# Patient Record
Sex: Male | Born: 1950
Health system: Southern US, Community
[De-identification: ages and names within clinical notes are randomized; demographics above are authoritative.]

## PROBLEM LIST (undated history)

## (undated) DIAGNOSIS — M1990 Unspecified osteoarthritis, unspecified site: Secondary | ICD-10-CM

## (undated) DIAGNOSIS — I10 Essential (primary) hypertension: Secondary | ICD-10-CM

## (undated) DIAGNOSIS — G4700 Insomnia, unspecified: Secondary | ICD-10-CM

## (undated) DIAGNOSIS — E785 Hyperlipidemia, unspecified: Secondary | ICD-10-CM

## (undated) HISTORY — DX: Essential (primary) hypertension: I10

## (undated) HISTORY — DX: Insomnia, unspecified: G47.00

## (undated) HISTORY — DX: Hyperlipidemia, unspecified: E78.5

## (undated) HISTORY — DX: Unspecified osteoarthritis, unspecified site: M19.90

---

## 2000-11-18 HISTORY — PX: TUMOR REMOVAL: SHX12

## 2005-09-03 ENCOUNTER — Ambulatory Visit: Payer: Self-pay

## 2007-11-19 HISTORY — PX: APPENDECTOMY: SHX54

## 2008-10-10 ENCOUNTER — Observation Stay: Payer: Self-pay | Admitting: Vascular Surgery

## 2010-05-24 ENCOUNTER — Ambulatory Visit: Payer: Self-pay | Admitting: Internal Medicine

## 2011-12-13 IMAGING — CR DG KNEE COMPLETE 4+V*R*
1 series · 4 of 4 positions shown · non-contrast
Comparison: none

REASON FOR EXAM: RT KNEE PAIN
COMMENTS:

PROCEDURE:     DXR - DXR KNEE RT COMP WITH OBLIQUES  - May 24, 2010  [DATE]
RESULT:     Images of the right knee demonstrate no significant degenerative
change, destruction or fracture. There is some hypertrophic spurring from
the superior anterior margin of the patella.

[Series 1: view not recorded · 0.17mm/px · 4 of 4 slices shown]
[im 1/4]
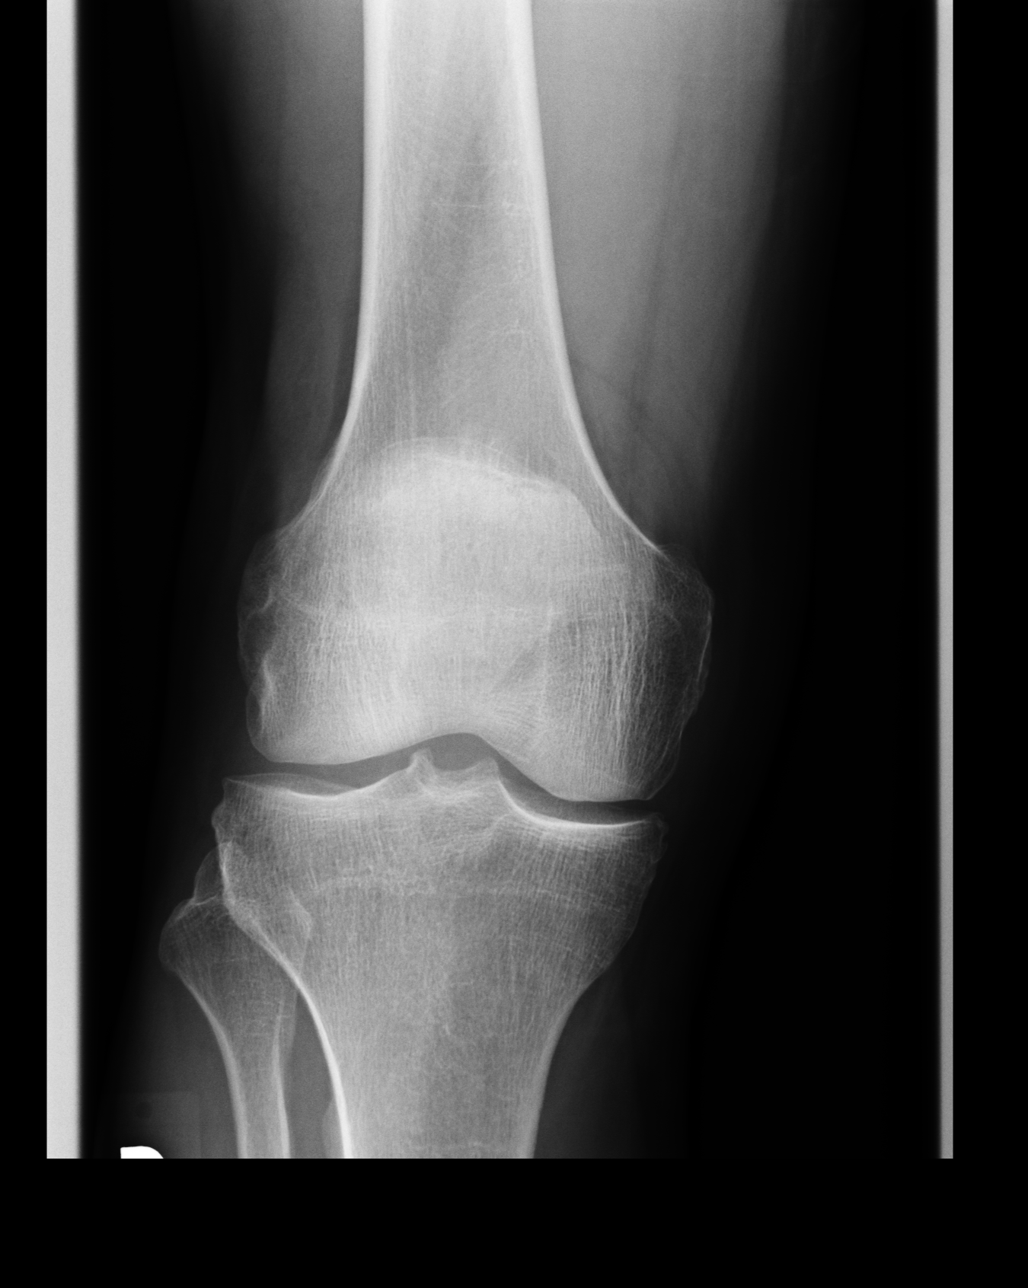
[im 2/4]
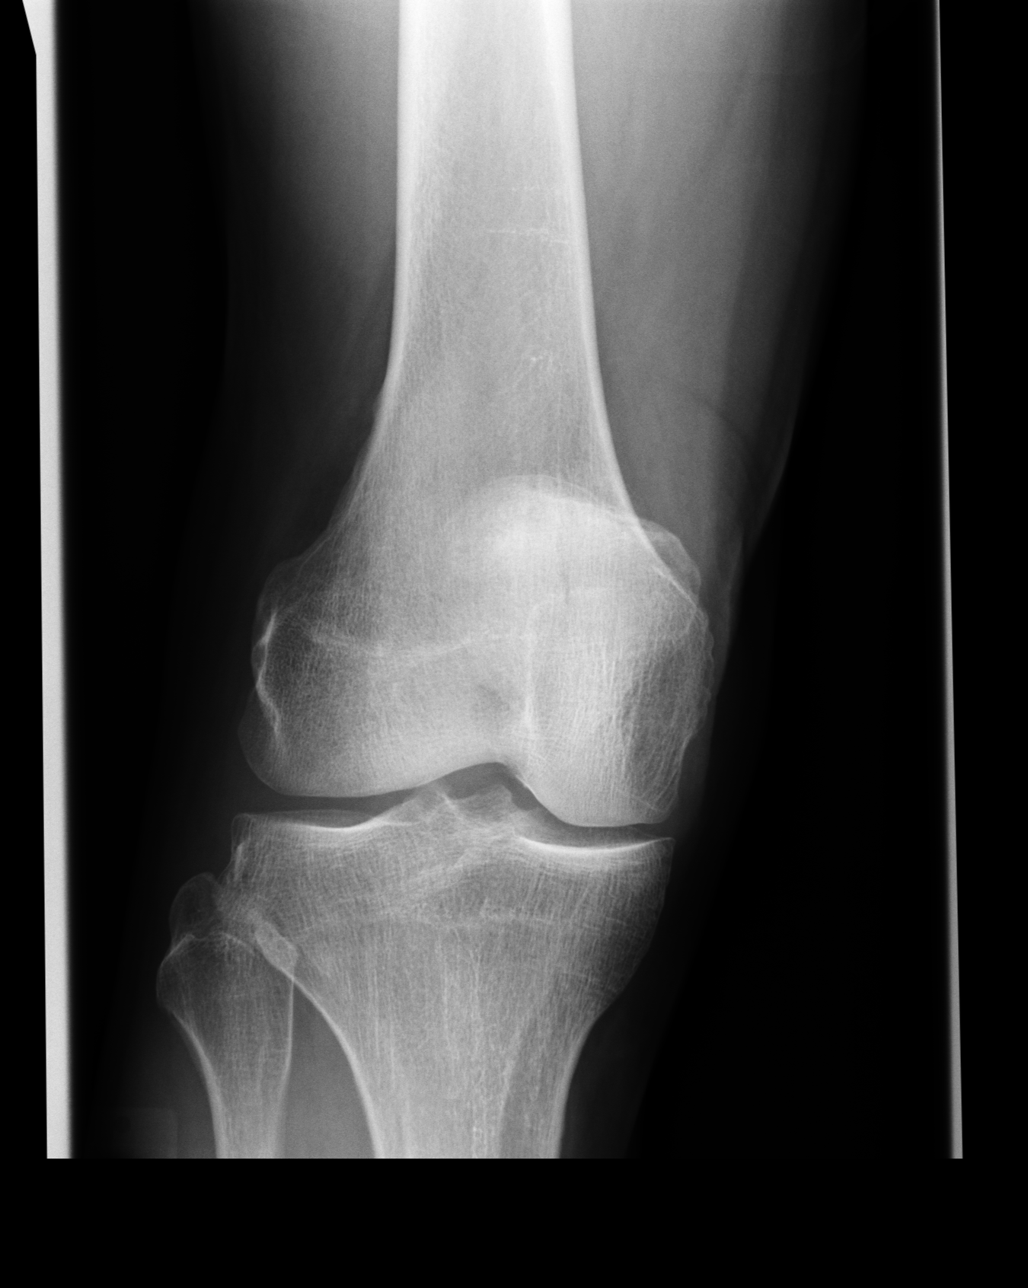
[im 3/4]
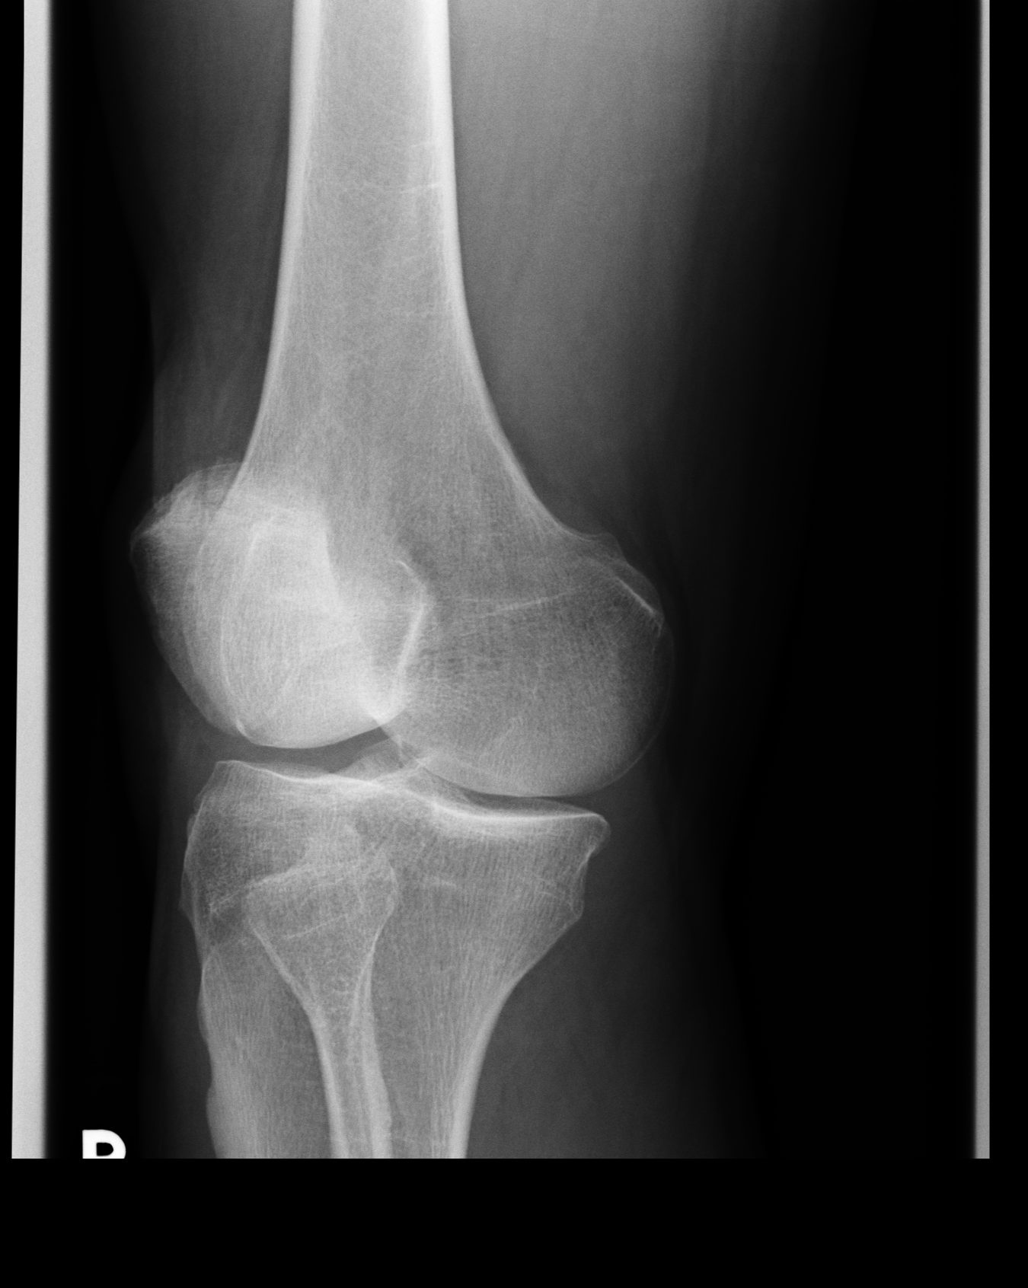
[im 4/4]
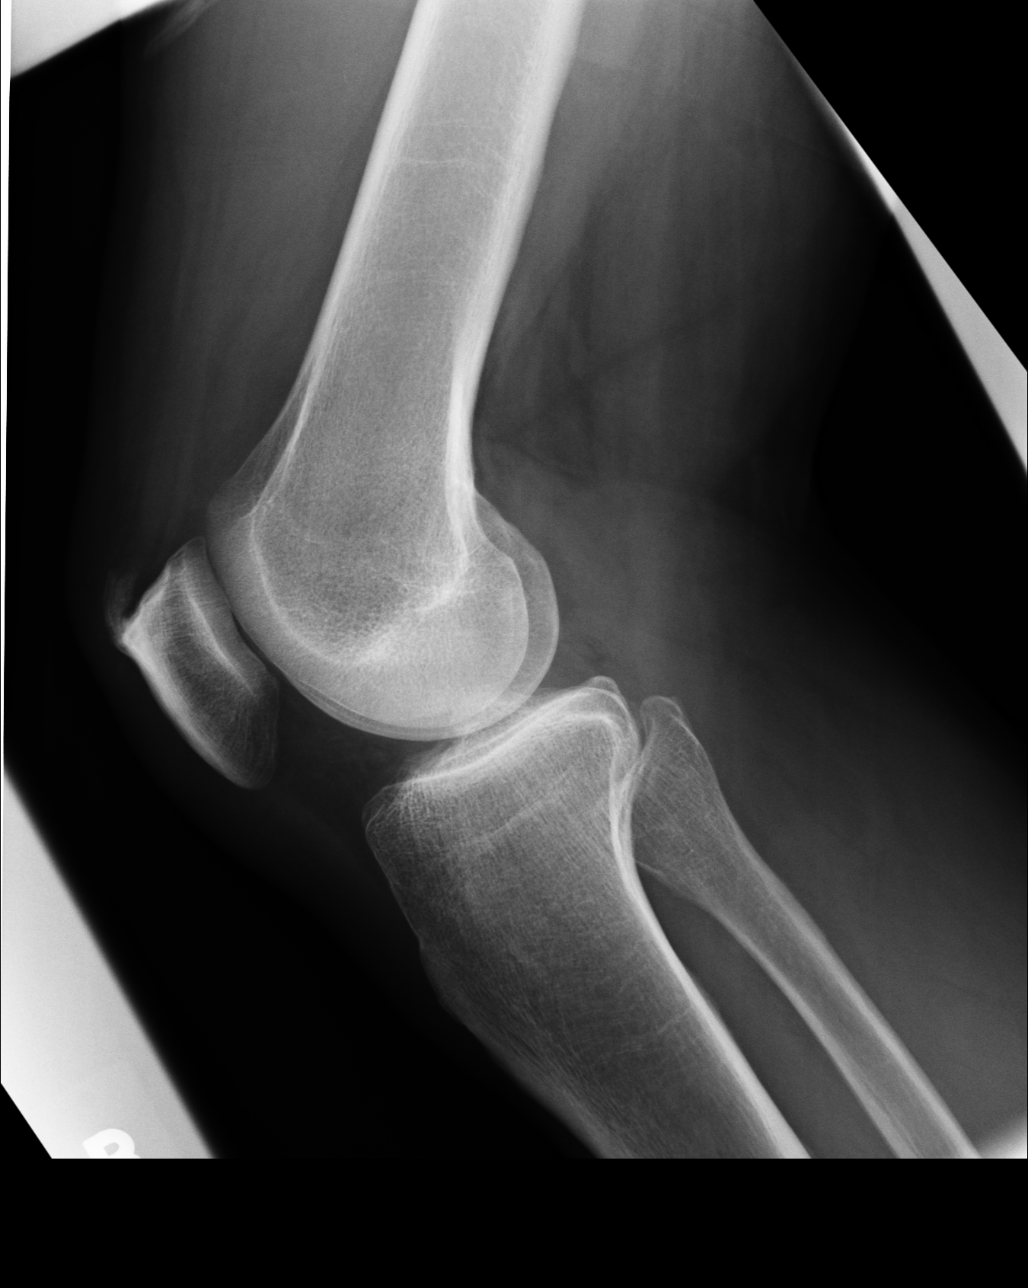

[4 of 4 positions shown; findings below may reference images not displayed]

IMPRESSION: No acute bony abnormality.

## 2014-01-24 ENCOUNTER — Emergency Department: Payer: Self-pay | Admitting: Emergency Medicine

## 2014-01-26 LAB — BETA STREP CULTURE(ARMC)

## 2015-01-16 ENCOUNTER — Encounter: Payer: Self-pay | Admitting: Podiatry

## 2015-01-16 ENCOUNTER — Ambulatory Visit (INDEPENDENT_AMBULATORY_CARE_PROVIDER_SITE_OTHER): Payer: Managed Care, Other (non HMO) | Admitting: Podiatry

## 2015-01-16 VITALS — BP 125/82 | HR 71 | Resp 16 | Ht 72.0 in | Wt 185.0 lb

## 2015-01-16 DIAGNOSIS — L600 Ingrowing nail: Secondary | ICD-10-CM

## 2015-01-16 MED ORDER — NEOMYCIN-POLYMYXIN-HC 3.5-10000-1 OT SOLN: OTIC | Status: DC

## 2015-01-16 NOTE — Progress Notes (Signed)
   Subjective:    Patient ID: Terry Harvey., male    DOB: 1951/01/13, 64 y.o.   MRN: [id number]  HPI Comments: "I've got something going on with the toe"  Patient c/o tender 1st toe right, lateral border, for about 8 months. Worsened in the last month. The area is red and swollen. He has been trimming it out. Soaking and neosporin daily for awhile. Better today.  Toe Pain       Review of Systems  Musculoskeletal: Positive for arthralgias and gait problem.  All other systems reviewed and are negative.      Objective:   Physical Exam: I have reviewed his past medical history medications allergies surgery social history and review of systems area pulses are strongly palpable bilateral and neurologic sensorium is intact. Neurologic sensorium is intact deep tendon reflexes are intact muscle strength is 5 over 5 dorsiflexion plantar flexors and inverters everters all into the musculature is intact. Orthopedic evaluation demonstrates all joints distal to the ankle have a full range of motion without crepitation. Cutaneous evaluation demonstrates supple well-hydrated cutis sharply rated nail margin along the fibular border of the hallux right with gross granulation tissue malodor and purulence.        Assessment & Plan:  Assessment: Ingrown nail paronychia abscess hallux right fibular border.  Plan: Discussed etiology pathology conservative versus surgical therapies at this point I encouraged excision of the fibular border of the nail with matrixectomy. He tolerated this procedure well after local anesthetic was administered. Reapplication's of phenol were applied to the nail bed and matrix which was then neutralized last from alcohol. I will follow-up with him in 1 week. He is given both oral and written home-going instructions for care of his toe.

## 2015-01-16 NOTE — Patient Instructions (Signed)

## 2015-01-23 ENCOUNTER — Encounter: Payer: Self-pay | Admitting: Podiatry

## 2015-01-23 ENCOUNTER — Ambulatory Visit (INDEPENDENT_AMBULATORY_CARE_PROVIDER_SITE_OTHER): Payer: Managed Care, Other (non HMO) | Admitting: Podiatry

## 2015-01-23 DIAGNOSIS — L600 Ingrowing nail: Secondary | ICD-10-CM

## 2015-01-23 NOTE — Progress Notes (Signed)
He presents today 1 week status post matrixectomy hallux right fibular border. He states that he is doing 100% better he says it hardly hurts at all. He states that he should've had this done longtime ago. He has been soaking in Betadine in warm water and applying Cortisporin otic as directed..  Objective: Pulses are palpable right. Hallux right demonstrates no erythema edema cellulitis drainage or odor. This appears to be healing very well.  Assessment: Well-healed surgical to hallux right.  Plan: Continue to soak in Epsom salts and warm water and apply Cortisporin otic twice daily. He will cover during the day and leave open at night. Follow up with me as needed.

## 2017-02-20 DIAGNOSIS — M170 Bilateral primary osteoarthritis of knee: Secondary | ICD-10-CM | POA: Diagnosis not present

## 2017-04-24 DIAGNOSIS — M179 Osteoarthritis of knee, unspecified: Secondary | ICD-10-CM | POA: Diagnosis not present

## 2017-04-24 DIAGNOSIS — I10 Essential (primary) hypertension: Secondary | ICD-10-CM | POA: Diagnosis not present

## 2017-04-24 DIAGNOSIS — F5101 Primary insomnia: Secondary | ICD-10-CM | POA: Diagnosis not present

## 2017-07-14 DIAGNOSIS — M170 Bilateral primary osteoarthritis of knee: Secondary | ICD-10-CM | POA: Diagnosis not present

## 2017-10-29 ENCOUNTER — Ambulatory Visit (INDEPENDENT_AMBULATORY_CARE_PROVIDER_SITE_OTHER): Payer: Medicare Other | Admitting: Nurse Practitioner

## 2017-10-29 ENCOUNTER — Other Ambulatory Visit: Payer: Self-pay | Admitting: Nurse Practitioner

## 2017-10-29 ENCOUNTER — Encounter: Payer: Self-pay | Admitting: Nurse Practitioner

## 2017-10-29 VITALS — BP 130/80 | HR 88 | Resp 16 | Ht 72.0 in | Wt 194.0 lb

## 2017-10-29 DIAGNOSIS — M170 Bilateral primary osteoarthritis of knee: Secondary | ICD-10-CM | POA: Diagnosis not present

## 2017-10-29 DIAGNOSIS — R10823 Right lower quadrant rebound abdominal tenderness: Secondary | ICD-10-CM | POA: Diagnosis not present

## 2017-10-29 DIAGNOSIS — F5101 Primary insomnia: Secondary | ICD-10-CM

## 2017-10-29 DIAGNOSIS — Z0001 Encounter for general adult medical examination with abnormal findings: Secondary | ICD-10-CM | POA: Diagnosis not present

## 2017-10-29 DIAGNOSIS — Z1211 Encounter for screening for malignant neoplasm of colon: Secondary | ICD-10-CM

## 2017-10-29 DIAGNOSIS — Z125 Encounter for screening for malignant neoplasm of prostate: Secondary | ICD-10-CM

## 2017-10-29 DIAGNOSIS — I10 Essential (primary) hypertension: Secondary | ICD-10-CM

## 2017-10-29 MED ORDER — ZOLPIDEM TARTRATE 10 MG PO TABS: 10.0000 mg | ORAL_TABLET | Freq: Every day | ORAL | 5 refills | Status: DC

## 2017-10-29 MED ORDER — TRAMADOL HCL 50 MG PO TABS: 50.0000 mg | ORAL_TABLET | Freq: Three times a day (TID) | ORAL | 1 refills | Status: DC

## 2017-10-29 NOTE — Progress Notes (Signed)
Subjective:     Patient ID: Terry Harvey., male   DOB: [birth year], 66 y.o.   MRN: [id number]  Right lower quadrant abdominal pain, very intermittent. Happens when he has to have a bowel movement and when he bends over or lifts anything heavy. Has been present for a few years, off and on. Did have appendectomy back in 2009. Curious if this surgical scar could be causing this discomfort.  Continues to have bilateral knee pain. Does have istory of significant osteoarthritis in both knees. Recently had cortisone injections into both knes. Really hasn't helped much. Is on his feet many hours each day. Holding out to have knee replacement surgery for as long as possible. Will follow up with orthopedics when needed.      Review of Systems  Constitutional: Negative.   HENT: Negative.   Respiratory: Negative for chest tightness and shortness of breath.   Cardiovascular: Negative for chest pain and palpitations.  Gastrointestinal: Positive for abdominal pain. Negative for blood in stool, constipation, diarrhea and nausea.       Intermittent right lower quadrant abdominal discomfort   Endocrine: Negative.   Genitourinary: Negative.   Musculoskeletal: Positive for arthralgias and myalgias.  Skin: Negative.   Allergic/Immunologic: Negative.   Neurological: Negative.   Hematological: Negative.   Psychiatric/Behavioral: Negative.        Objective:   Physical Exam  Constitutional: He is oriented to person, place, and time. He appears well-developed and well-nourished.  HENT:  Head: Normocephalic and atraumatic.  Neck: Normal range of motion. Neck supple. No JVD present. No thyromegaly present.  Cardiovascular: Normal rate and regular rhythm.  Pulmonary/Chest: Effort normal and breath sounds normal.  Abdominal: Soft. Bowel sounds are normal. He exhibits no distension, no abdominal bruit and no mass. There is no hepatosplenomegaly. There is no tenderness. There is no guarding and no CVA  tenderness. Hernia confirmed negative in the ventral area, confirmed negative in the right inguinal area and confirmed negative in the left inguinal area.  Musculoskeletal:       Right shoulder: He exhibits decreased range of motion and tenderness.       Right knee: Tenderness found. Medial joint line, lateral joint line, MCL and patellar tendon tenderness noted.       Left knee: He exhibits normal patellar mobility. Tenderness found. Medial joint line, lateral joint line and LCL tenderness noted.  Neurological: He is alert and oriented to person, place, and time. He has normal reflexes.  Skin: Skin is warm and dry.  Psychiatric: He has a normal mood and affect. His behavior is normal.       Assessment:       1. Encounter for routine adult health examination with abnormal findings  - Urinalysis, Routine w reflex microscopic  2. Hypertension, unspecified type   3. Right lower quadrant abdominal tenderness with rebound tenderness  - US Abdomen Complete; Future  4. Encounter for screening for malignant neoplasm of prostate   5. Encounter for screening for malignant neoplasm of colon   6. Primary osteoarthritis of both knees  - traMADol (ULTRAM) 50 MG tablet; Take 1 tablet (50 mg total) by mouth 3 (three) times daily.  Dispense: 90 tablet; Refill: 1  7. Primary insomnia  - zolpidem (AMBIEN) 10 MG tablet; Take 1 tablet (10 mg total) by mouth at bedtime.  Dispense: 30 tablet; Refill: 5 Plan:     1. Wellness visit - routine fasting labs ordered 2. htn - stable. Continue bp medication as prescribed  3. RLQ tenderness - ultrasound ordered for further evaluation. 4. Screening for prostate cancer - ordered PDA with labs 5. Screening for colon cancer - FOBT ordered. Consider colonoscopy 6. Primary osteoarthritis of both knees - May continue tramadol 50mg  TID as before. NSAID to reduce pain/inflammation. Continue follow-up with ortho as indicated  7. Insomnia - may continue zlpidem  10mg  at bedtime as needed. Refill provided today.

## 2017-10-29 NOTE — Patient Instructions (Signed)
1. Abdominal ultrasound to evaluate for possible hernia 2. Routine fasting labs ordered, PSA, and FOBT

## 2017-11-03 ENCOUNTER — Other Ambulatory Visit: Payer: Self-pay

## 2017-11-03 LAB — URINALYSIS, ROUTINE W REFLEX MICROSCOPIC
BILIRUBIN UA: NEGATIVE
GLUCOSE, UA: NEGATIVE
KETONES UA: NEGATIVE
LEUKOCYTES UA: NEGATIVE
NITRITE UA: NEGATIVE
Protein, UA: NEGATIVE
RBC UA: NEGATIVE
SPEC GRAV UA: 1.015 (ref 1.005–1.030)
Urobilinogen, Ur: 0.2 mg/dL (ref 0.2–1.0)
pH, UA: 6 (ref 5.0–7.5)

## 2017-11-24 ENCOUNTER — Other Ambulatory Visit: Payer: Medicare Other

## 2017-12-08 ENCOUNTER — Ambulatory Visit (INDEPENDENT_AMBULATORY_CARE_PROVIDER_SITE_OTHER): Payer: Medicare Other

## 2017-12-08 DIAGNOSIS — R10823 Right lower quadrant rebound abdominal tenderness: Secondary | ICD-10-CM | POA: Diagnosis not present

## 2017-12-08 DIAGNOSIS — Z0001 Encounter for general adult medical examination with abnormal findings: Secondary | ICD-10-CM | POA: Diagnosis not present

## 2017-12-08 DIAGNOSIS — I10 Essential (primary) hypertension: Secondary | ICD-10-CM | POA: Diagnosis not present

## 2017-12-08 DIAGNOSIS — E782 Mixed hyperlipidemia: Secondary | ICD-10-CM | POA: Diagnosis not present

## 2017-12-08 DIAGNOSIS — Z1211 Encounter for screening for malignant neoplasm of colon: Secondary | ICD-10-CM | POA: Diagnosis not present

## 2017-12-30 ENCOUNTER — Encounter: Payer: Self-pay | Admitting: Nurse Practitioner

## 2017-12-31 ENCOUNTER — Telehealth: Payer: Self-pay

## 2017-12-31 NOTE — Telephone Encounter (Signed)
Can you let him know that ultrasound of the abdomen looks good. No acute abnormalities.  Also, I do not see his labs in here at all. They are not in [facility] either.

## 2017-12-31 NOTE — Telephone Encounter (Signed)
Pt advised for u/s and called [facility] for labs

## 2018-01-02 NOTE — Telephone Encounter (Signed)
Also, his blood work looked great. Ldl cholesterol was just a little elevated. That can be corrected with diet and exercise. We can mail him out prudent diet information if he wants. I also have his labs if he'd like a copy. All othe labs were good. Thanks.

## 2018-01-12 ENCOUNTER — Encounter: Payer: Self-pay | Admitting: Nurse Practitioner

## 2018-01-19 ENCOUNTER — Telehealth: Payer: Self-pay

## 2018-01-19 ENCOUNTER — Other Ambulatory Visit: Payer: Self-pay | Admitting: Internal Medicine

## 2018-01-19 NOTE — Telephone Encounter (Signed)
Pt wife advised mailed labs and copy of prudent diet/np

## 2018-03-18 ENCOUNTER — Encounter: Payer: Self-pay | Admitting: Nurse Practitioner

## 2018-03-19 DIAGNOSIS — M170 Bilateral primary osteoarthritis of knee: Secondary | ICD-10-CM | POA: Diagnosis not present

## 2018-03-20 DIAGNOSIS — M170 Bilateral primary osteoarthritis of knee: Secondary | ICD-10-CM | POA: Diagnosis not present

## 2018-04-17 ENCOUNTER — Other Ambulatory Visit: Payer: Self-pay

## 2018-04-17 MED ORDER — METOPROLOL SUCCINATE ER 50 MG PO TB24: 50.0000 mg | ORAL_TABLET | Freq: Every day | ORAL | 1 refills | Status: DC

## 2018-04-22 ENCOUNTER — Other Ambulatory Visit: Payer: Self-pay | Admitting: Nurse Practitioner

## 2018-04-22 DIAGNOSIS — M170 Bilateral primary osteoarthritis of knee: Secondary | ICD-10-CM

## 2018-04-22 MED ORDER — TRAMADOL HCL 50 MG PO TABS: 50.0000 mg | ORAL_TABLET | Freq: Three times a day (TID) | ORAL | 0 refills | Status: DC

## 2018-04-27 ENCOUNTER — Telehealth: Payer: Self-pay

## 2018-04-27 NOTE — Telephone Encounter (Signed)
Error

## 2018-04-30 ENCOUNTER — Ambulatory Visit (INDEPENDENT_AMBULATORY_CARE_PROVIDER_SITE_OTHER): Payer: Medicare Other | Admitting: Nurse Practitioner

## 2018-04-30 ENCOUNTER — Encounter: Payer: Self-pay | Admitting: Nurse Practitioner

## 2018-04-30 VITALS — BP 149/88 | HR 76 | Resp 16 | Ht 72.0 in | Wt 193.6 lb

## 2018-04-30 DIAGNOSIS — R10823 Right lower quadrant rebound abdominal tenderness: Secondary | ICD-10-CM | POA: Diagnosis not present

## 2018-04-30 DIAGNOSIS — F5101 Primary insomnia: Secondary | ICD-10-CM

## 2018-04-30 DIAGNOSIS — M170 Bilateral primary osteoarthritis of knee: Secondary | ICD-10-CM | POA: Diagnosis not present

## 2018-04-30 DIAGNOSIS — I10 Essential (primary) hypertension: Secondary | ICD-10-CM

## 2018-04-30 MED ORDER — ZOLPIDEM TARTRATE 10 MG PO TABS: 10.0000 mg | ORAL_TABLET | Freq: Every day | ORAL | 1 refills | Status: DC

## 2018-04-30 MED ORDER — TRAMADOL HCL 50 MG PO TABS: 50.0000 mg | ORAL_TABLET | Freq: Three times a day (TID) | ORAL | 1 refills | Status: DC

## 2018-04-30 NOTE — Progress Notes (Signed)
[facility] [place], [place] [address]  Internal MEDICINE  Office Visit Note  Patient Name: Terry Harvey  [id number]  [id number]  Date of Service: 05/17/2018   Pt is here for routine follow up.   Chief Complaint  Patient presents with  . Osteoarthritis    6 month follow up  . Hypertension    The patient has moderate arthritis in both knees. Takes naproxen 2 to 3 times daiy to control pain/inflammation. Uses tramadol when needed for breakthrough pain. This combination helps to control his pain very well. Needs to have refills for tramadol today.   Right lower quadrant abdominal pain, very intermittent. Happens when he has to have a bowel movement and when he bends over or lifts anything heavy. Has been present for a few years, off and on. Did have appendectomy back in 2009. Curious if this surgical scar could be causing this discomfort.  Ultrasound was doe of the abdomen since his last visit. Did not show abnormality in right lower quadrant. He did have a great deal of bowel gas which obscured the view of organs. .       Current Medication: Outpatient Encounter Medications as of 04/30/2018  Medication Sig  . bisoprolol-hydrochlorothiazide (ZIAC) 5-6.25 MG per tablet Take 1 tablet by mouth daily.  . metoprolol succinate (TOPROL-XL) 50 MG 24 hr tablet Take 1 tablet (50 mg total) by mouth daily.  . Misc Natural Products (OSTEO BI-FLEX JOINT SHIELD PO) Take by mouth.  . Multiple Vitamin (ONE-A-DAY MENS PO) Take by mouth.  . naproxen sodium (ANAPROX) 220 MG tablet Take 220 mg by mouth 2 (two) times daily with a meal.  . neomycin-polymyxin-hydrocortisone (CORTISPORIN) otic solution Apply one to two drops to toe after soaking twice daily.  . traMADol (ULTRAM) 50 MG tablet Take 1 tablet (50 mg total) by mouth 3 (three) times daily.  . [DISCONTINUED] traMADol (ULTRAM) 50 MG tablet Take 1 tablet (50 mg total) by mouth 3 (three) times daily.  Marland Kitchen zolpidem  (AMBIEN) 10 MG tablet Take 1 tablet (10 mg total) by mouth at bedtime.  . [DISCONTINUED] zolpidem (AMBIEN) 10 MG tablet Take 1 tablet (10 mg total) by mouth at bedtime.   No facility-administered encounter medications on file as of 04/30/2018.     Surgical History: Past Surgical History:  Procedure Laterality Date  . APPENDECTOMY  2009  . TUMOR REMOVAL  2002   from right side of neck    Medical History: Past Medical History:  Diagnosis Date  . Hyperlipidemia   . Hypertension   . Insomnia   . Osteoarthritis     Family History: Family History  Problem Relation Age of Onset  . Diabetes Mellitus II Mother   . Hypertension Father     Social History   Socioeconomic History  . Marital status: Married    Spouse name: Not on file  . Number of children: Not on file  . Years of education: Not on file  . Highest education level: Not on file  Occupational History  . Not on file  Social Needs  . Financial resource strain: Not on file  . Food insecurity:    Worry: Not on file    Inability: Not on file  . Transportation needs:    Medical: Not on file    Non-medical: Not on file  Tobacco Use  . Smoking status: Former Smoker    Start date: 11/19/2011  . Smokeless tobacco: Never Used  Substance and Sexual  Activity  . Alcohol use: No    Alcohol/week: 0.0 oz  . Drug use: No  . Sexual activity: Not on file  Lifestyle  . Physical activity:    Days per week: Not on file    Minutes per session: Not on file  . Stress: Not on file  Relationships  . Social connections:    Talks on phone: Not on file    Gets together: Not on file    Attends religious service: Not on file    Active member of club or organization: Not on file    Attends meetings of clubs or organizations: Not on file    Relationship status: Not on file  . Intimate partner violence:    Fear of current or ex partner: Not on file    Emotionally abused: Not on file    Physically abused: Not on file    Forced  sexual activity: Not on file  Other Topics Concern  . Not on file  Social History Narrative  . Not on file      Review of Systems  Constitutional: Negative for activity change, chills, fatigue and unexpected weight change.  HENT: Negative for congestion, postnasal drip, rhinorrhea, sneezing and sore throat.   Eyes: Negative.  Negative for redness.  Respiratory: Negative for cough, chest tightness, shortness of breath and wheezing.   Cardiovascular: Negative for chest pain and palpitations.  Gastrointestinal: Negative for abdominal pain, blood in stool, constipation, diarrhea, nausea and vomiting.       Intermittent right lower quadrant abdominal discomfort   Endocrine: Negative for cold intolerance, polydipsia, polyphagia and polyuria.  Genitourinary: Negative.  Negative for dysuria and frequency.  Musculoskeletal: Positive for arthralgias and myalgias. Negative for back pain, joint swelling and neck pain.       Bilateral knees.   Skin: Negative for rash.  Allergic/Immunologic: Negative for environmental allergies.  Neurological: Negative for dizziness, tremors, numbness and headaches.  Hematological: Negative for adenopathy. Does not bruise/bleed easily.  Psychiatric/Behavioral: Positive for sleep disturbance. Negative for behavioral problems (Depression), dysphoric mood and suicidal ideas.    Today's Vitals   04/30/18 1420  BP: (!) 149/88  Pulse: 76  Resp: 16  SpO2: 94%  Weight: 193 lb 9.6 oz (87.8 kg)  Height: 6' (1.829 m)    Physical Exam  Constitutional: He is oriented to person, place, and time. He appears well-developed and well-nourished.  HENT:  Head: Normocephalic and atraumatic.  Nose: Nose normal.  Eyes: Pupils are equal, round, and reactive to light. Conjunctivae and EOM are normal.  Neck: Normal range of motion. Neck supple. No JVD present. Carotid bruit is not present. No tracheal deviation present. No thyromegaly present.  Cardiovascular: Normal rate,  regular rhythm and normal heart sounds.  Pulmonary/Chest: Effort normal and breath sounds normal. He has no wheezes.  Abdominal: Soft. Bowel sounds are normal. He exhibits no distension, no abdominal bruit and no mass. There is no hepatosplenomegaly. There is no tenderness. There is no guarding and no CVA tenderness. Hernia confirmed negative in the ventral area, confirmed negative in the right inguinal area and confirmed negative in the left inguinal area.  Musculoskeletal:       Right shoulder: He exhibits normal range of motion and no tenderness.       Right knee: Tenderness found. Medial joint line, lateral joint line, MCL and patellar tendon tenderness noted.       Left knee: He exhibits normal patellar mobility. Tenderness found. Medial joint line, lateral joint line  and LCL tenderness noted.  Lymphadenopathy:    He has no cervical adenopathy.  Neurological: He is alert and oriented to person, place, and time. He has normal reflexes.  Skin: Skin is warm and dry. Capillary refill takes less than 2 seconds.  Psychiatric: He has a normal mood and affect. His behavior is normal. Judgment and thought content normal.  Nursing note and vitals reviewed.  Assessment/Plan:  1. Hypertension, unspecified type Stable. Continue bp medication as prescribed   2. Primary osteoarthritis of both knees May continue to take tramadol 50mg  up to TID when needed for joint pain. New prescription sent to his pharmacy. - traMADol (ULTRAM) 50 MG tablet; Take 1 tablet (50 mg total) by mouth 3 (three) times daily.  Dispense: 270 tablet; Refill: 1  3. Primary insomnia May take ambien at bedtime as needed. New prescription sent to pharmacy.  - zolpidem (AMBIEN) 10 MG tablet; Take 1 tablet (10 mg total) by mouth at bedtime.  Dispense: 90 tablet; Refill: 1  4. Right lower quadrant abdominal tenderness with rebound tenderness Continues to have intermittent RLQ tenderness, especially when bending at the waist. No  acute abnormalities seen on ultrasound. Will get further testing as indictaed.   General Counseling: Kary verbalizes understanding of the findings of todays visit and agrees with plan of treatment. I have discussed any further diagnostic evaluation that may be needed or ordered today. We also reviewed his medications today. he has been encouraged to call the office with any questions or concerns that should arise related to todays visit.    Counseling:  This patient was seen by Leretha Pol, FNP- C in Collaboration with Dr Lavera Guise as a part of collaborative care agreement    Meds ordered this encounter  Medications  . traMADol (ULTRAM) 50 MG tablet    Sig: Take 1 tablet (50 mg total) by mouth 3 (three) times daily.    Dispense:  270 tablet    Refill:  1    90 days prescription.    Order Specific Question:   Supervising Provider    Answer:   Lavera Guise [[id number]]  . zolpidem (AMBIEN) 10 MG tablet    Sig: Take 1 tablet (10 mg total) by mouth at bedtime.    Dispense:  90 tablet    Refill:  1    90 day prescription.    Order Specific Question:   Supervising Provider    Answer:   Lavera Guise [[id number]]    Time spent: 53 Minutes     Dr Lavera Guise Internal medicine

## 2018-06-30 ENCOUNTER — Ambulatory Visit: Payer: Medicare Other | Admitting: Nurse Practitioner

## 2018-06-30 ENCOUNTER — Encounter: Payer: Self-pay | Admitting: Nurse Practitioner

## 2018-06-30 VITALS — BP 153/92 | HR 82 | Resp 16 | Ht 72.0 in | Wt 193.4 lb

## 2018-06-30 DIAGNOSIS — R10823 Right lower quadrant rebound abdominal tenderness: Secondary | ICD-10-CM

## 2018-06-30 DIAGNOSIS — K4090 Unilateral inguinal hernia, without obstruction or gangrene, not specified as recurrent: Secondary | ICD-10-CM

## 2018-06-30 NOTE — Progress Notes (Signed)
[facility] [place], [place] [address]  Internal MEDICINE  Office Visit Note  Patient Name: Terry Harvey  [id number]  [id number]  Date of Service: 06/30/2018   Pt is here for a sick visit.  Chief Complaint  Patient presents with  . Pain    pain and lump in the groin area, for wks. pain started in lower abdomen area about the groin right side     The patient states that he woke up this morning and noted a swelling in right groin area. Not really tender but feels tight. Does not recall any activities requiring significant exertion. Does not remember an injury or any tearing sensation. Swelling is limited to the right groin area. No abdominal pain or swelling. No pain or swelling present in the testicles.        Current Medication:  Outpatient Encounter Medications as of 06/30/2018  Medication Sig  . bisoprolol-hydrochlorothiazide (ZIAC) 5-6.25 MG per tablet Take 1 tablet by mouth daily.  . metoprolol succinate (TOPROL-XL) 50 MG 24 hr tablet Take 1 tablet (50 mg total) by mouth daily.  . Misc Natural Products (OSTEO BI-FLEX JOINT SHIELD PO) Take by mouth.  . Multiple Vitamin (ONE-A-DAY MENS PO) Take by mouth.  . naproxen sodium (ANAPROX) 220 MG tablet Take 220 mg by mouth 2 (two) times daily with a meal.  . neomycin-polymyxin-hydrocortisone (CORTISPORIN) otic solution Apply one to two drops to toe after soaking twice daily.  . traMADol (ULTRAM) 50 MG tablet Take 1 tablet (50 mg total) by mouth 3 (three) times daily.  Marland Kitchen zolpidem (AMBIEN) 10 MG tablet Take 1 tablet (10 mg total) by mouth at bedtime.   No facility-administered encounter medications on file as of 06/30/2018.       Medical History: Past Medical History:  Diagnosis Date  . Hyperlipidemia   . Hypertension   . Insomnia   . Osteoarthritis      Vital Signs: BP (!) 153/92 (BP Location: Right Arm, Patient Position: Sitting, Cuff Size: Large)   Pulse 82   Resp 16   Ht 6'  (1.829 m)   Wt 193 lb 6.4 oz (87.7 kg)   SpO2 95%   BMI 26.23 kg/m    Review of Systems  Constitutional: Negative for chills, fatigue and unexpected weight change.  HENT: Negative for congestion, postnasal drip, rhinorrhea, sneezing and sore throat.   Eyes: Negative.  Negative for redness.  Respiratory: Negative for cough, chest tightness, shortness of breath and wheezing.   Cardiovascular: Negative for chest pain and palpitations.  Gastrointestinal: Negative for abdominal distention, abdominal pain, constipation, diarrhea, nausea and vomiting.  Endocrine: Negative for cold intolerance, heat intolerance, polydipsia, polyphagia and polyuria.  Genitourinary: Negative for decreased urine volume, dysuria, flank pain, frequency, penile pain, scrotal swelling and testicular pain.       Swelling in right groin, just above the testicles, in suprapubic region.   Musculoskeletal: Negative for arthralgias, back pain, joint swelling and neck pain.  Skin: Negative for rash.  Allergic/Immunologic: Negative for environmental allergies.  Neurological: Negative for dizziness, tremors, numbness and headaches.  Hematological: Negative for adenopathy. Does not bruise/bleed easily.  Psychiatric/Behavioral: Negative for behavioral problems (Depression), sleep disturbance and suicidal ideas. The patient is not nervous/anxious.     Physical Exam  Constitutional: He is oriented to person, place, and time. He appears well-developed and well-nourished. No distress.  HENT:  Head: Normocephalic and atraumatic.  Nose: Nose normal.  Mouth/Throat: Oropharynx is clear and moist. No oropharyngeal  exudate.  Eyes: Pupils are equal, round, and reactive to light. Conjunctivae and EOM are normal.  Neck: Normal range of motion. Neck supple. No JVD present. No tracheal deviation present. No thyromegaly present.  Cardiovascular: Normal rate, regular rhythm and normal heart sounds. Exam reveals no gallop and no friction rub.   No murmur heard. Pulmonary/Chest: Effort normal and breath sounds normal. No respiratory distress. He has no wheezes. He has no rales. He exhibits no tenderness.  Abdominal: Soft. Bowel sounds are normal. There is tenderness in the right lower quadrant and suprapubic area. A hernia is present. Hernia confirmed positive in the right inguinal area.  Swelling in right suprapubic and right groin area. Area is relatively large with smooth contour. No redness or warmth noted. No tenderness with palpation. Gets slightly larger with exertion.   Musculoskeletal: Normal range of motion.  Lymphadenopathy:    He has no cervical adenopathy.  Neurological: He is alert and oriented to person, place, and time. No cranial nerve deficit.  Skin: Skin is warm and dry. He is not diaphoretic.  Psychiatric: He has a normal mood and affect. His behavior is normal. Judgment and thought content normal.  Nursing note and vitals reviewed.  Assessment/Plan: 1. Right inguinal hernia Will ultrasound right pelvis for further evaluation. Will refer to surgery as indicated.  - US Pelvis Limited; Future  2. Right lower quadrant abdominal tenderness with rebound tenderness Prior u/s done 11/2017 did not indicate any clear abnormalities. New u/s ordered today for further evaluation.   General Counseling: Kalonji verbalizes understanding of the findings of todays visit and agrees with plan of treatment. I have discussed any further diagnostic evaluation that may be needed or ordered today. We also reviewed his medications today. he has been encouraged to call the office with any questions or concerns that should arise related to todays visit.    Counseling:    Orders Placed This Encounter  Procedures  . US Pelvis Limited    This patient was seen by Leretha Pol FNP Collaboration with Dr Lavera Guise as a part of collaborative care agreement   Time spent:15 Minutes

## 2018-07-03 ENCOUNTER — Ambulatory Visit (INDEPENDENT_AMBULATORY_CARE_PROVIDER_SITE_OTHER): Payer: Medicare Other

## 2018-07-03 DIAGNOSIS — K4090 Unilateral inguinal hernia, without obstruction or gangrene, not specified as recurrent: Secondary | ICD-10-CM

## 2018-07-06 ENCOUNTER — Ambulatory Visit: Payer: Medicare Other | Admitting: Nurse Practitioner

## 2018-07-06 ENCOUNTER — Encounter: Payer: Self-pay | Admitting: Nurse Practitioner

## 2018-07-06 VITALS — BP 152/90 | HR 73 | Resp 16 | Ht 72.0 in | Wt 192.0 lb

## 2018-07-06 DIAGNOSIS — K4090 Unilateral inguinal hernia, without obstruction or gangrene, not specified as recurrent: Secondary | ICD-10-CM | POA: Insufficient documentation

## 2018-07-06 DIAGNOSIS — R10823 Right lower quadrant rebound abdominal tenderness: Secondary | ICD-10-CM

## 2018-07-06 NOTE — Progress Notes (Signed)
[facility] [place], [place] [address]  Internal MEDICINE  Office Visit Note  Patient Name: Terry Harvey  [id number]  [id number]  Date of Service: 07/06/2018  Chief Complaint  Patient presents with  . Labs Only    ultrasound results  . Quality Metric Gaps    pneumovax    The patient states that he woke up this morning and noted a swelling in right groin area. Not really tender but feels tight. Does not recall any activities requiring significant exertion. Does not remember an injury or any tearing sensation. Swelling is limited to the right groin area. No abdominal pain or swelling. No pain or swelling present in the testicles. He did have ultrasound done last Friday. Does show hypoechoic mass in rlq of the abdomen inhaled corticosteroids still tender. Pain is interfering with his usual activities.       Current Medication: Outpatient Encounter Medications as of 07/06/2018  Medication Sig  . metoprolol succinate (TOPROL-XL) 50 MG 24 hr tablet Take 1 tablet (50 mg total) by mouth daily.  . Misc Natural Products (OSTEO BI-FLEX JOINT SHIELD PO) Take by mouth.  . Multiple Vitamin (ONE-A-DAY MENS PO) Take by mouth.  . naproxen sodium (ANAPROX) 220 MG tablet Take 220 mg by mouth 2 (two) times daily with a meal.  . traMADol (ULTRAM) 50 MG tablet Take 1 tablet (50 mg total) by mouth 3 (three) times daily.  Marland Kitchen zolpidem (AMBIEN) 10 MG tablet Take 1 tablet (10 mg total) by mouth at bedtime.  . bisoprolol-hydrochlorothiazide (ZIAC) 5-6.25 MG per tablet Take 1 tablet by mouth daily.  Marland Kitchen neomycin-polymyxin-hydrocortisone (CORTISPORIN) otic solution Apply one to two drops to toe after soaking twice daily. (Patient not taking: Reported on 07/06/2018)   No facility-administered encounter medications on file as of 07/06/2018.     Surgical History: Past Surgical History:  Procedure Laterality Date  . APPENDECTOMY  2009  . TUMOR REMOVAL  2002   from right side of  neck    Medical History: Past Medical History:  Diagnosis Date  . Hyperlipidemia   . Hypertension   . Insomnia   . Osteoarthritis     Family History: Family History  Problem Relation Age of Onset  . Diabetes Mellitus II Mother   . Hypertension Father     Social History   Socioeconomic History  . Marital status: Married    Spouse name: Not on file  . Number of children: Not on file  . Years of education: Not on file  . Highest education level: Not on file  Occupational History  . Not on file  Social Needs  . Financial resource strain: Not on file  . Food insecurity:    Worry: Not on file    Inability: Not on file  . Transportation needs:    Medical: Not on file    Non-medical: Not on file  Tobacco Use  . Smoking status: Former Smoker    Start date: 11/19/2011  . Smokeless tobacco: Never Used  Substance and Sexual Activity  . Alcohol use: No    Alcohol/week: 0.0 standard drinks  . Drug use: No  . Sexual activity: Not on file  Lifestyle  . Physical activity:    Days per week: Not on file    Minutes per session: Not on file  . Stress: Not on file  Relationships  . Social connections:    Talks on phone: Not on file    Gets together: Not on file  Attends religious service: Not on file    Active member of club or organization: Not on file    Attends meetings of clubs or organizations: Not on file    Relationship status: Not on file  . Intimate partner violence:    Fear of current or ex partner: Not on file    Emotionally abused: Not on file    Physically abused: Not on file    Forced sexual activity: Not on file  Other Topics Concern  . Not on file  Social History Narrative  . Not on file      Review of Systems  Constitutional: Positive for activity change. Negative for chills, fatigue and unexpected weight change.  HENT: Negative for congestion, postnasal drip, rhinorrhea, sneezing and sore throat.   Eyes: Negative.  Negative for redness.   Respiratory: Negative for cough, chest tightness, shortness of breath and wheezing.   Cardiovascular: Negative for chest pain and palpitations.  Gastrointestinal: Positive for abdominal pain. Negative for abdominal distention, constipation, diarrhea, nausea and vomiting.  Endocrine: Negative for cold intolerance, heat intolerance, polydipsia, polyphagia and polyuria.  Genitourinary: Negative for decreased urine volume, dysuria, flank pain, frequency, penile pain, scrotal swelling and testicular pain.       Swelling in right groin, just above the testicles, in suprapubic region.   Musculoskeletal: Negative for arthralgias, back pain, joint swelling and neck pain.  Skin: Negative for rash.  Allergic/Immunologic: Negative for environmental allergies.  Neurological: Negative for dizziness, tremors, numbness and headaches.  Hematological: Negative for adenopathy. Does not bruise/bleed easily.  Psychiatric/Behavioral: Negative for behavioral problems (Depression), sleep disturbance and suicidal ideas. The patient is not nervous/anxious.     Today's Vitals   07/06/18 0942  BP: (!) 152/90  Pulse: 73  Resp: 16  SpO2: 96%  Weight: 192 lb (87.1 kg)  Height: 6' (1.829 m)   Physical Exam  Constitutional: He is oriented to person, place, and time. He appears well-developed and well-nourished. No distress.  HENT:  Head: Normocephalic and atraumatic.  Nose: Nose normal.  Mouth/Throat: Oropharynx is clear and moist. No oropharyngeal exudate.  Eyes: Pupils are equal, round, and reactive to light. Conjunctivae and EOM are normal.  Neck: Normal range of motion. Neck supple. No JVD present. No tracheal deviation present. No thyromegaly present.  Cardiovascular: Normal rate, regular rhythm and normal heart sounds. Exam reveals no gallop and no friction rub.  No murmur heard. Pulmonary/Chest: Effort normal and breath sounds normal. No respiratory distress. He has no wheezes. He has no rales. He exhibits  no tenderness.  Abdominal: Soft. Bowel sounds are normal. There is tenderness in the right lower quadrant and suprapubic area. A hernia is present. Hernia confirmed positive in the right inguinal area.  Swelling in right suprapubic and right groin area. Area is relatively large with smooth contour. No redness or warmth noted. No tenderness with palpation. Gets slightly larger with exertion.   Musculoskeletal: Normal range of motion.  Lymphadenopathy:    He has no cervical adenopathy.  Neurological: He is alert and oriented to person, place, and time. No cranial nerve deficit.  Skin: Skin is warm and dry. He is not diaphoretic.  Psychiatric: He has a normal mood and affect. His behavior is normal. Judgment and thought content normal.  Nursing note and vitals reviewed.  Assessment/Plan: 1. Right lower quadrant abdominal tenderness with rebound tenderness Review ultrasound of the abdomen/pelvis. Does show hypoechoic mass which is 2.8 X 1.2 cm in diameter. Not entirely conclusive for inguinal hernies. Will  get CT scan of abdomen and pelvis with contrast for further evaluation. Will refer to surgery for further treatment.  - CT Abdomen Pelvis W Contrast; Future - Ambulatory referral to General Surgery  2. Right inguinal hernia Review ultrasound of the abdomen/pelvis. Does show hypoechoic mass which is 2.8 X 1.2 cm in diameter. Not entirely conclusive for inguinal hernies. Will get CT scan of abdomen and pelvis with contrast for further evaluation. Will refer to surgery for further treatment.  - Ambulatory referral to General Surgery  General Counseling: Whitney verbalizes understanding of the findings of todays visit and agrees with plan of treatment. I have discussed any further diagnostic evaluation that may be needed or ordered today. We also reviewed his medications today. he has been encouraged to call the office with any questions or concerns that should arise related to todays  visit.    Orders Placed This Encounter  Procedures  . CT Abdomen Pelvis W Contrast  . Ambulatory referral to General Surgery    This patient was seen by Leretha Pol FNP Collaboration with Dr Lavera Guise as a part of collaborative care agreement  Time spent: 44 Minutes   Dr Lavera Guise Internal medicine

## 2018-08-19 DIAGNOSIS — Z23 Encounter for immunization: Secondary | ICD-10-CM | POA: Diagnosis not present

## 2018-09-28 ENCOUNTER — Encounter: Payer: Self-pay | Admitting: *Deleted

## 2018-10-07 ENCOUNTER — Ambulatory Visit (INDEPENDENT_AMBULATORY_CARE_PROVIDER_SITE_OTHER): Payer: Medicare Other | Admitting: Surgery

## 2018-10-07 ENCOUNTER — Encounter: Payer: Self-pay | Admitting: Surgery

## 2018-10-07 ENCOUNTER — Other Ambulatory Visit: Payer: Self-pay

## 2018-10-07 VITALS — BP 159/96 | HR 69 | Temp 97.3°F | Resp 18 | Ht 72.0 in | Wt 197.8 lb

## 2018-10-07 DIAGNOSIS — R1084 Generalized abdominal pain: Secondary | ICD-10-CM | POA: Diagnosis not present

## 2018-10-07 DIAGNOSIS — K4090 Unilateral inguinal hernia, without obstruction or gangrene, not specified as recurrent: Secondary | ICD-10-CM

## 2018-10-07 NOTE — Patient Instructions (Addendum)
Patient will need to be scheduled for a CT Scan of the abdomen/pelvis with contrast, return to the office to see Dr.Piscoya after test has been completed.   Inguinal Hernia, Adult An inguinal hernia is when fat or the intestines push through the area where the leg meets the lower belly (groin) and make a rounded lump (bulge). This condition happens over time. There are three types of inguinal hernias. These types include:  Hernias that can be pushed back into the belly (are reducible).  Hernias that cannot be pushed back into the belly (are incarcerated).  Hernias that cannot be pushed back into the belly and lose their blood supply (get strangulated). This type needs emergency surgery.  Follow these instructions at home: Lifestyle  Drink enough fluid to keep your urine (pee) clear or pale yellow.  Eat plenty of fruits, vegetables, and whole grains. These have a lot of fiber. Talk with your doctor if you have questions.  Avoid lifting heavy objects.  Avoid standing for long periods of time.  Do not use tobacco products. These include cigarettes, chewing tobacco, or e-cigarettes. If you need help quitting, ask your doctor.  Try to stay at a healthy weight. General instructions  Do not try to force the hernia back in.  Watch your hernia for any changes in color or size. Let your doctor know if there are any changes.  Take over-the-counter and prescription medicines only as told by your doctor.  Keep all follow-up visits as told by your doctor. This is important. Contact a doctor if:  You have a fever.  You have new symptoms.  Your symptoms get worse. Get help right away if:  The area where the legs meets the lower belly has: ? Pain that gets worse suddenly. ? A bulge that gets bigger suddenly and does not go down. ? A bulge that turns red or purple. ? A bulge that is painful to the touch.  You are a man and your scrotum: ? Suddenly feels painful. ? Suddenly changes in  size.  You feel sick to your stomach (nauseous) and this feeling does not go away.  You throw up (vomit) and this keeps happening.  You feel your heart beating a lot more quickly than normal.  You cannot poop (have a bowel movement) or pass gas. This information is not intended to replace advice given to you by your health care provider. Make sure you discuss any questions you have with your health care provider. Document Released: 12/05/2006 Document Revised: 04/11/2016 Document Reviewed: 09/14/2014 Elsevier Interactive Patient Education  2018 [employer].

## 2018-10-07 NOTE — Progress Notes (Signed)
10/07/2018  Reason for Visit:  Right inguinal hernia  Referring Provider:  Leretha Pol, NP  History of Present Illness: Terry Harvey. is a 67 y.o. male presenting for evaluation of a possible right inguinal hernia.  Patient reports that he first noted a bulge around August of this year.  Unfortunately his wife was ill and he had to take care of her over the past few months and did not seek further help with the bulge.  Now his wife has recovered and he wanted to get this bulge checked.  He reports that the bulge is in his right groin.  He believes it started after doing strenuous activity but he did not feel any popping sensation.  There is no pain associated with this.  He is uncertain if the bulge reduces on its own, but he thinks there are times where it's larger and others where it's smaller.  He has not pushed on it to try to reduce it.  Denies any obstructive symptoms.  Denies any weight loss, fevers, chills, night sweats.  He had an ultrasound initially when this happened, but it was inconclusive.  There is no imaging to see, but the reports mentions an area of density in the right groin, but it does not appear to change with Valsalva and there is no discernable defect.   Past Medical History: Past Medical History:  Diagnosis Date  . Hyperlipidemia   . Hypertension   . Insomnia   . Osteoarthritis      Past Surgical History: Past Surgical History:  Procedure Laterality Date  . APPENDECTOMY  2009  . TUMOR REMOVAL  2002   from right side of neck    Home Medications: Prior to Admission medications   Medication Sig Start Date End Date Taking? Authorizing Provider  Glucosamine-Chondroitin 250-200 MG TABS Take by mouth.   Yes [provider]  metoprolol succinate (TOPROL-XL) 50 MG 24 hr tablet Take 1 tablet (50 mg total) by mouth daily. 04/17/18  Yes Boscia, Greer Ee, NP  Misc Natural Products (OSTEO BI-FLEX JOINT SHIELD PO) Take by mouth.   Yes [provider]  Multiple Vitamin (ONE-A-DAY MENS PO) Take by mouth.   Yes [provider]  naproxen sodium (ANAPROX) 220 MG tablet Take 220 mg by mouth 3 (three) times daily.    Yes [provider]  traMADol (ULTRAM) 50 MG tablet Take 1 tablet (50 mg total) by mouth 3 (three) times daily. 04/30/18  Yes Ronnell Freshwater, NP    Allergies: Allergies  Allergen Reactions  . Poison [place] Extract [Poison Oak Extract]     Social History:  reports that he has quit smoking. He started smoking about 6 years ago. He has never used smokeless tobacco. He reports that he does not drink alcohol or use drugs.   Family History: Family History  Problem Relation Age of Onset  . Diabetes Mellitus II Mother   . Hypertension Father     Review of Systems: Review of Systems  Constitutional: Negative for chills and fever.  Eyes: Negative for blurred vision.  Respiratory: Negative for shortness of breath.   Cardiovascular: Negative for chest pain.  Gastrointestinal: Negative for abdominal pain, nausea and vomiting.  Genitourinary: Negative for dysuria.  Musculoskeletal: Negative for myalgias.  Skin: Negative for rash.  Neurological: Negative for dizziness.  Psychiatric/Behavioral: Negative for depression.    Physical Exam BP (!) 159/96   Pulse 69   Temp (!) 97.3 F (36.3 C) (Temporal)   Resp  18   Ht 6' (1.829 m)   Wt 197 lb 12.8 oz (89.7 kg)   SpO2 98%   BMI 26.83 kg/m  CONSTITUTIONAL: No acute distress HEENT:  Normocephalic, atraumatic, extraocular motion intact. NECK: Trachea is midline, and there is no jugular venous distension.  RESPIRATORY:  Lungs are clear, and breath sounds are equal bilaterally. Normal respiratory effort without pathologic use of accessory muscles. CARDIOVASCULAR: Heart is regular without murmurs, gallops, or rubs. GI: The abdomen is soft, non-distended, non-tender to palpation.  The patient has a bulge in the right groin that could be a hernia.  It  reduced on its own when the patient laid flat on the exam bed, but it does not protrude when he strains or sits up.  I could not palpate a true hernia defect.  He does have diastasis recti of the upper abdomen, and possibly an umbilical hernia from prior laparoscopic appendectomy.   MUSCULOSKELETAL:  Normal muscle strength and tone in all four extremities.  No peripheral edema or cyanosis. SKIN: Skin turgor is normal. There are no pathologic skin lesions.  NEUROLOGIC:  Motor and sensation is grossly normal.  Cranial nerves are grossly intact. PSYCH:  Alert and oriented to person, place and time. Affect is normal.  Laboratory Analysis: No results found for this or any previous visit (from the past 24 hour(s)).  Imaging: Ultrasound 07/03/18: There is a focal hypoechoic density which corresponds to the area of tenderness identified by the patient.  It measures 2.8 x 1.2 cm in overall dimension.  It does not appear to change significantly with Valsalva maneuver.  This area does not appear to be herniating through any defect in the abdominal wall.  Assessment and Plan: This is a 67 y.o. male with a possible right inguinal hernia.    Discussed with the patient that it was somewhat unclear to me if he truly had a hernia on the right groin.  Overall, it does appear to be a hernia, but his ultrasound is equivocal.  To be absolutely sure so that we can discuss the appropriate surgery, risks, and outcomes, I would like to get a CT scan of abdomen and pelvis first to identify this right inguinal hernia better.  I believe he may have an umbilical hernia and we'd be able to see it on CT as well.  This could also confirm the presence of diastasis recti for documentation purposes.  He will follow up after the CT scan is done so we can discuss surgery options if anything is needed.  He understands this plan and is in agreement with getting the CT scan first.  Face-to-face time spent with the patient and care  providers was 60 minutes, with more than 50% of the time spent counseling, educating, and coordinating care of the patient.     Melvyn Neth, [facility]

## 2018-10-12 ENCOUNTER — Other Ambulatory Visit: Payer: Self-pay

## 2018-10-12 MED ORDER — METOPROLOL SUCCINATE ER 50 MG PO TB24: 50.0000 mg | ORAL_TABLET | Freq: Every day | ORAL | 1 refills | Status: DC

## 2018-10-20 ENCOUNTER — Ambulatory Visit
Admission: RE | Admit: 2018-10-20 | Discharge: 2018-10-20 | Disposition: A | Discharge status: Home / Self Care | Payer: Medicare Other | Source: Ambulatory Visit | Attending: Surgery | Admitting: Surgery

## 2018-10-20 DIAGNOSIS — R1084 Generalized abdominal pain: Secondary | ICD-10-CM

## 2018-10-20 DIAGNOSIS — N2889 Other specified disorders of kidney and ureter: Secondary | ICD-10-CM | POA: Diagnosis not present

## 2018-10-20 LAB — POCT I-STAT CREATININE: Creatinine, Ser: 1.2 mg/dL (ref 0.61–1.24)

## 2018-10-20 MED ORDER — IOPAMIDOL (ISOVUE-300) INJECTION 61%
100.0000 mL | Freq: Once | INTRAVENOUS | Status: AC | PRN
  Administered 2018-10-20: 100 mL via INTRAVENOUS

## 2018-10-23 ENCOUNTER — Other Ambulatory Visit: Payer: Self-pay

## 2018-10-23 ENCOUNTER — Ambulatory Visit (INDEPENDENT_AMBULATORY_CARE_PROVIDER_SITE_OTHER): Payer: Medicare Other | Admitting: Surgery

## 2018-10-23 ENCOUNTER — Encounter: Payer: Self-pay | Admitting: Surgery

## 2018-10-23 VITALS — BP 145/86 | HR 84 | Temp 98.2°F | Resp 18 | Ht 72.0 in | Wt 199.6 lb

## 2018-10-23 DIAGNOSIS — K4090 Unilateral inguinal hernia, without obstruction or gangrene, not specified as recurrent: Secondary | ICD-10-CM | POA: Diagnosis not present

## 2018-10-23 NOTE — Progress Notes (Signed)
10/23/2018  History of Present Illness: Terry Harvey. is a 67 y.o. male who presents for evaluation of a right inguinal hernia.  He had had a prior ultrasound which was equivocal and thus a CT scan of the abdomen was obtained which did confirm a right inguinal hernia containing fat and possibly a section of small bowel which was right at the entrance of the hernia contents.  Patient reports no new symptoms and reports that she just recovered from a cold and during sneezing or coughing he could feel the extra discomfort in the right groin.  Past Medical History: Past Medical History:  Diagnosis Date  . Hyperlipidemia   . Hypertension   . Insomnia   . Osteoarthritis      Past Surgical History: Past Surgical History:  Procedure Laterality Date  . APPENDECTOMY  2009  . TUMOR REMOVAL  2002   from right side of neck    Home Medications: Prior to Admission medications   Medication Sig Start Date End Date Taking? Authorizing Provider  Glucosamine-Chondroitin 250-200 MG TABS Take by mouth.   Yes [provider]  metoprolol succinate (TOPROL-XL) 50 MG 24 hr tablet Take 1 tablet (50 mg total) by mouth daily. 10/12/18  Yes Boscia, Greer Ee, NP  Misc Natural Products (OSTEO BI-FLEX JOINT SHIELD PO) Take by mouth.   Yes [provider]  Multiple Vitamin (ONE-A-DAY MENS PO) Take by mouth.   Yes [provider]  naproxen sodium (ANAPROX) 220 MG tablet Take 220 mg by mouth 3 (three) times daily.    Yes [provider]  traMADol (ULTRAM) 50 MG tablet Take 1 tablet (50 mg total) by mouth 3 (three) times daily. 04/30/18  Yes Ronnell Freshwater, NP    Allergies: Allergies  Allergen Reactions  . [place] Extract]     Review of Systems: Review of Systems  Constitutional: Negative for chills and fever.  Respiratory: Negative for shortness of breath.   Cardiovascular: Negative for chest pain.  Gastrointestinal: Negative for abdominal  pain, nausea and vomiting.    Physical Exam BP (!) 145/86   Pulse 84   Temp 98.2 F (36.8 C) (Temporal)   Resp 18   Ht 6' (1.829 m)   Wt 199 lb 9.6 oz (90.5 kg)   SpO2 95%   BMI 27.07 kg/m  CONSTITUTIONAL: No acute distress HEENT:  Normocephalic, atraumatic, extraocular motion intact. RESPIRATORY:  Lungs are clear, and breath sounds are equal bilaterally. Normal respiratory effort without pathologic use of accessory muscles. CARDIOVASCULAR: Heart is regular without murmurs, gallops, or rubs. GI: The abdomen is soft, nondistended, nontender to palpation.  Patient has a reducible right inguinal hernia. NEUROLOGIC:  Motor and sensation is grossly normal.  Cranial nerves are grossly intact. PSYCH:  Alert and oriented to person, place and time. Affect is normal.  Labs/Imaging: CT scan from 12/3 IMPRESSION: 1. Right inguinal canal fat and vessel containing hernia. Knuckle of small bowel extends to the superior margin of the right inguinal canal but currently does not enter such. Right lateral aspect of the urinary bladder extends towards the inguinal canal but does not enter such. 2. Bilateral low-density renal lesions largest lower pole left kidney measures up to 4 cm. Some of the low-density structures are cysts whereas others cannot be confirmed as simple cysts by present exam. Dedicated contrast enhanced renal MR would be necessary for further delineation if clinically desired. 3. Slightly enlarged minimally lobulated prostate gland. 4. Aortic Atherosclerosis (ICD10-I70.0). Right  coronary artery calcification. 5. Fatty liver. Multiple low-density liver lesions, larger ones which are cysts and others too small to adequately characterize. Several of these were present in 2009. 6. Degenerative changes lower lumbar spine most notable L4-5 level. Bilateral hip joint degenerative changes. 7. 5 mm nodule left lung base (series 3, image 1). No follow-up needed if patient is low-risk.  Non-contrast chest CT can be considered in 12 months if patient is high-risk. This recommendation follows the consensus statement: Guidelines for Management of Incidental Pulmonary Nodules Detected on CT Images: From the Fleischner Society 2017; Radiology 2017; 284:228-243.  Assessment and Plan: This is a 67 y.o. male with a right inguinal hernia.  I have independently viewed the patient's imaging study.  He does have a right inguinal hernia containing fat with an area of small bowel that is just adjacent to the entrance of the inguinal canal as well as the corner of the bladder in that vicinity as well.  Only fat is bulging at this point.  Discussed with the patient that we have confirmed a right inguinal hernia and given his symptoms, we can proceed with a right inguinal hernia repair.  Given that there is a chance that he could have small bowel possibly bulging through would rather approach this via open procedure rather than laparoscopy.  Discussed with him the role of an open right inguinal hernia repair.  Discussed with him the risks of bleeding, infection, injury to surrounding structures and he is willing to proceed.  Discussed with him that he will have a restriction of no heavy lifting or pushing of no more than 10 to 15 pounds repair 4 weeks after surgery.  He is willing to proceed.  We will schedule him for 10/29/2018.  Face-to-face time spent with the patient and care providers was 15 minutes, with more than 50% of the time spent counseling, educating, and coordinating care of the patient.     Melvyn Neth, [facility]

## 2018-10-23 NOTE — H&P (View-Only) (Signed)
10/23/2018  History of Present Illness: Terry Harvey. is a 67 y.o. male who presents for evaluation of a right inguinal hernia.  He had had a prior ultrasound which was equivocal and thus a CT scan of the abdomen was obtained which did confirm a right inguinal hernia containing fat and possibly a section of small bowel which was right at the entrance of the hernia contents.  Patient reports no new symptoms and reports that she just recovered from a cold and during sneezing or coughing he could feel the extra discomfort in the right groin.  Past Medical History: Past Medical History:  Diagnosis Date  . Hyperlipidemia   . Hypertension   . Insomnia   . Osteoarthritis      Past Surgical History: Past Surgical History:  Procedure Laterality Date  . APPENDECTOMY  2009  . TUMOR REMOVAL  2002   from right side of neck    Home Medications: Prior to Admission medications   Medication Sig Start Date End Date Taking? Authorizing Provider  Glucosamine-Chondroitin 250-200 MG TABS Take by mouth.   Yes [provider]  metoprolol succinate (TOPROL-XL) 50 MG 24 hr tablet Take 1 tablet (50 mg total) by mouth daily. 10/12/18  Yes Boscia, Greer Ee, NP  Misc Natural Products (OSTEO BI-FLEX JOINT SHIELD PO) Take by mouth.   Yes [provider]  Multiple Vitamin (ONE-A-DAY MENS PO) Take by mouth.   Yes [provider]  naproxen sodium (ANAPROX) 220 MG tablet Take 220 mg by mouth 3 (three) times daily.    Yes [provider]  traMADol (ULTRAM) 50 MG tablet Take 1 tablet (50 mg total) by mouth 3 (three) times daily. 04/30/18  Yes Ronnell Freshwater, NP    Allergies: Allergies  Allergen Reactions  . [place] Extract]     Review of Systems: Review of Systems  Constitutional: Negative for chills and fever.  Respiratory: Negative for shortness of breath.   Cardiovascular: Negative for chest pain.  Gastrointestinal: Negative for abdominal  pain, nausea and vomiting.    Physical Exam BP (!) 145/86   Pulse 84   Temp 98.2 F (36.8 C) (Temporal)   Resp 18   Ht 6' (1.829 m)   Wt 199 lb 9.6 oz (90.5 kg)   SpO2 95%   BMI 27.07 kg/m  CONSTITUTIONAL: No acute distress HEENT:  Normocephalic, atraumatic, extraocular motion intact. RESPIRATORY:  Lungs are clear, and breath sounds are equal bilaterally. Normal respiratory effort without pathologic use of accessory muscles. CARDIOVASCULAR: Heart is regular without murmurs, gallops, or rubs. GI: The abdomen is soft, nondistended, nontender to palpation.  Patient has a reducible right inguinal hernia. NEUROLOGIC:  Motor and sensation is grossly normal.  Cranial nerves are grossly intact. PSYCH:  Alert and oriented to person, place and time. Affect is normal.  Labs/Imaging: CT scan from 12/3 IMPRESSION: 1. Right inguinal canal fat and vessel containing hernia. Knuckle of small bowel extends to the superior margin of the right inguinal canal but currently does not enter such. Right lateral aspect of the urinary bladder extends towards the inguinal canal but does not enter such. 2. Bilateral low-density renal lesions largest lower pole left kidney measures up to 4 cm. Some of the low-density structures are cysts whereas others cannot be confirmed as simple cysts by present exam. Dedicated contrast enhanced renal MR would be necessary for further delineation if clinically desired. 3. Slightly enlarged minimally lobulated prostate gland. 4. Aortic Atherosclerosis (ICD10-I70.0). Right  coronary artery calcification. 5. Fatty liver. Multiple low-density liver lesions, larger ones which are cysts and others too small to adequately characterize. Several of these were present in 2009. 6. Degenerative changes lower lumbar spine most notable L4-5 level. Bilateral hip joint degenerative changes. 7. 5 mm nodule left lung base (series 3, image 1). No follow-up needed if patient is low-risk.  Non-contrast chest CT can be considered in 12 months if patient is high-risk. This recommendation follows the consensus statement: Guidelines for Management of Incidental Pulmonary Nodules Detected on CT Images: From the Fleischner Society 2017; Radiology 2017; 284:228-243.  Assessment and Plan: This is a 67 y.o. male with a right inguinal hernia.  I have independently viewed the patient's imaging study.  He does have a right inguinal hernia containing fat with an area of small bowel that is just adjacent to the entrance of the inguinal canal as well as the corner of the bladder in that vicinity as well.  Only fat is bulging at this point.  Discussed with the patient that we have confirmed a right inguinal hernia and given his symptoms, we can proceed with a right inguinal hernia repair.  Given that there is a chance that he could have small bowel possibly bulging through would rather approach this via open procedure rather than laparoscopy.  Discussed with him the role of an open right inguinal hernia repair.  Discussed with him the risks of bleeding, infection, injury to surrounding structures and he is willing to proceed.  Discussed with him that he will have a restriction of no heavy lifting or pushing of no more than 10 to 15 pounds repair 4 weeks after surgery.  He is willing to proceed.  We will schedule him for 10/29/2018.  Face-to-face time spent with the patient and care providers was 15 minutes, with more than 50% of the time spent counseling, educating, and coordinating care of the patient.     Melvyn Neth, [facility]

## 2018-10-23 NOTE — Patient Instructions (Addendum)
Patient needs to be scheduled with Dr.Piscoya for hernia repair tentatively on 10/29/18.   Call the office with any questions or concerns.    Open Hernia Repair, Adult Open hernia repair is a surgical procedure to fix a hernia. A hernia occurs when an internal organ or tissue pushes out through a weak spot in the abdominal wall muscles. Hernias commonly occur in the groin and around the navel. Most hernias tend to get worse over time. Often, surgery is done to prevent the hernia from becoming bigger, uncomfortable, or an emergency. Emergency surgery may be needed if abdominal contents get stuck in the opening (incarcerated hernia) or the blood supply gets cut off (strangulated hernia). In an open repair, an incision is made in the abdomen to perform the surgery. Tell a health care provider about:  Any allergies you have.  All medicines you are taking, including vitamins, herbs, eye drops, creams, and over-the-counter medicines.  Any problems you or family members have had with anesthetic medicines.  Any blood or bone disorders you have.  Any surgeries you have had.  Any medical conditions you have, including any recent cold or flu symptoms.  Whether you are pregnant or may be pregnant. What are the risks? Generally, this is a safe procedure. However, problems may occur, including:  Long-lasting (chronic) pain.  Bleeding.  Infection.  Damage to the testicle. This can cause shrinking or swelling.  Damage to the bladder, blood vessels, intestine, or nerves near the hernia.  Trouble passing urine.  Allergic reactions to medicines.  Return of the hernia.  What happens before the procedure? Staying hydrated Follow instructions from your health care provider about hydration, which may include:  Up to 2 hours before the procedure - you may continue to drink clear liquids, such as water, clear fruit juice, black coffee, and plain tea.  Eating and drinking restrictions Follow  instructions from your health care provider about eating and drinking, which may include:  8 hours before the procedure - stop eating heavy meals or foods such as meat, fried foods, or fatty foods.  6 hours before the procedure - stop eating light meals or foods, such as toast or cereal.  6 hours before the procedure - stop drinking milk or drinks that contain milk.  2 hours before the procedure - stop drinking clear liquids.  Medicines  Ask your health care provider about: ? Changing or stopping your regular medicines. This is especially important if you are taking diabetes medicines or blood thinners. ? Taking medicines such as aspirin and ibuprofen. These medicines can thin your blood. Do not take these medicines before your procedure if your health care provider instructs you not to.  You may be given antibiotic medicine to help prevent infection. General instructions  You may have blood tests or imaging studies.  Ask your health care provider how your surgical site will be marked or identified.  If you smoke, do not smoke for at least 2 weeks before your procedure or for as long as told by your health care provider.  Let your health care provider know if you develop a cold or any infection before your surgery.  Plan to have someone take you home from the hospital or clinic.  If you will be going home right after the procedure, plan to have someone with you for 24 hours. What happens during the procedure?  To reduce your risk of infection: ? Your health care team will wash or sanitize their hands. ? Your skin  will be washed with soap. ? Hair may be removed from the surgical area.  An IV tube will be inserted into one of your veins.  You will be given one or more of the following: ? A medicine to help you relax (sedative). ? A medicine to numb the area (local anesthetic). ? A medicine to make you fall asleep (general anesthetic).  Your surgeon will make an incision over  the hernia.  The tissues of the hernia will be moved back into place.  The edges of the hernia may be stitched together.  The opening in the abdominal muscles will be closed with stitches (sutures). Or, your surgeon will place a mesh patch made of manmade (synthetic) material over the opening.  The incision will be closed.  A bandage (dressing) may be placed over the incision. The procedure may vary among health care providers and hospitals. What happens after the procedure?  Your blood pressure, heart rate, breathing rate, and blood oxygen level will be monitored until the medicines you were given have worn off.  You may be given medicine for pain.  Do not drive for 24 hours if you received a sedative. This information is not intended to replace advice given to you by your health care provider. Make sure you discuss any questions you have with your health care provider. Document Released: 04/30/2001 Document Revised: 05/24/2016 Document Reviewed: 04/17/2016 Elsevier Interactive Patient Education  2018 [place] A hernia happens when an organ or tissue inside your body pushes out through a weak spot in the belly (abdomen). Follow these instructions at home:  Avoid stretching or overusing (straining) the muscles near the hernia.  Do not lift anything heavier than 10 lb (4.5 kg).  Use the muscles in your leg when you lift something up. Do not use the muscles in your back.  When you cough, try to cough gently.  Eat a diet that has a lot of fiber. Eat lots of fruits and vegetables.  Drink enough fluids to keep your pee (urine) clear or pale yellow. Try to drink 6-8 glasses of water a day.  Take medicines to make your poop soft (stool softeners) as told by your doctor.  Lose weight, if you are overweight.  Do not use any tobacco products, including cigarettes, chewing tobacco, or electronic cigarettes. If you need help quitting, ask your doctor.  Keep all  follow-up visits as told by your doctor. This is important. Contact a doctor if:  The skin by the hernia gets puffy (swollen) or red.  The hernia is painful. Get help right away if:  You have a fever.  You have belly pain that is getting worse.  You feel sick to your stomach (nauseous) or you throw up (vomit).  You cannot push the hernia back in place by gently pressing on it while you are lying down.  The hernia: ? Changes in shape or size. ? Is stuck outside your belly. ? Changes color. ? Feels hard or tender. This information is not intended to replace advice given to you by your health care provider. Make sure you discuss any questions you have with your health care provider. Document Released: 04/24/2010 Document Revised: 04/11/2016 Document Reviewed: 09/14/2014 Elsevier Interactive Patient Education  [employer].   The patient is scheduled for surgery at [facility] on 10/29/18 with Dr Hampton Abbot. He will pre admit by phone. The patient is aware of date and instructions.

## 2018-10-26 ENCOUNTER — Telehealth: Payer: Self-pay | Admitting: *Deleted

## 2018-10-26 ENCOUNTER — Encounter: Payer: Self-pay | Admitting: Nurse Practitioner

## 2018-10-26 ENCOUNTER — Ambulatory Visit: Payer: Medicare Other | Admitting: Nurse Practitioner

## 2018-10-26 VITALS — BP 143/86 | HR 105 | Temp 98.1°F | Resp 16 | Ht 72.0 in | Wt 199.0 lb

## 2018-10-26 DIAGNOSIS — R6889 Other general symptoms and signs: Secondary | ICD-10-CM | POA: Insufficient documentation

## 2018-10-26 DIAGNOSIS — J069 Acute upper respiratory infection, unspecified: Secondary | ICD-10-CM | POA: Insufficient documentation

## 2018-10-26 DIAGNOSIS — K4090 Unilateral inguinal hernia, without obstruction or gangrene, not specified as recurrent: Secondary | ICD-10-CM

## 2018-10-26 DIAGNOSIS — I10 Essential (primary) hypertension: Secondary | ICD-10-CM | POA: Diagnosis not present

## 2018-10-26 LAB — POCT INFLUENZA A/B: Influenza A, POC: NEGATIVE

## 2018-10-26 MED ORDER — AMOXICILLIN-POT CLAVULANATE 875-125 MG PO TABS: 1.0000 | ORAL_TABLET | Freq: Two times a day (BID) | ORAL | 0 refills | Status: DC

## 2018-10-26 NOTE — Telephone Encounter (Signed)
Patient contacted the office today and states that he is feeling better but still congested. He was seen by his PCP, Juliette Alcide today and was prescribed Augmentin.   The patient was scheduled for hernia surgery with Dr. Hampton Abbot for 10-29-18 at [facility].   I contacted Dr. Hampton Abbot and he states that we need to reschedule patient's surgery for a couple weeks out.  We will get patient's surgery rescheduled from 10-29-18 to 11-12-18.   Patient aware that his phone interview that was scheduled for tomorrow will probably get moved closer to surgery date.   He is aware to contact the office if he has further questions.

## 2018-10-26 NOTE — Progress Notes (Signed)
[facility] [place], [place] [address]  Internal MEDICINE  Office Visit Note  Patient Name: Terry Harvey  [id number]  [id number]  Date of Service: 10/26/2018    Ptr a sick visit.     Chief Complaint  Patient presents with  . Cough  . Sinusitis  . Generalized Body Aches     The patient is here for sick visit. Has cough, congestion, headache, and nausea. Has been going on for a little over a week. Is scheduled to have hernia repair on Thursday 10/29/2018. Did see surgeon last week. Felt like he would be ok for surgery, would get over this before surgery. Symptoms have gotten worse. Cough is congested and he is bringing up some yellow brown fluid. Flu swab today is negative.        Current Medication:  Outpatient Encounter Medications as of 10/26/2018  Medication Sig  . metoprolol succinate (TOPROL-XL) 50 MG 24 hr tablet Take 1 tablet (50 mg total) by mouth daily.  . Misc Natural Products (OSTEO BI-FLEX JOINT SHIELD PO) Take 2 tablets by mouth daily.   . Multiple Vitamin (ONE-A-DAY MENS PO) Take 1 tablet by mouth daily.   . naproxen sodium (ANAPROX) 220 MG tablet Take 220 mg by mouth 2 (two) times daily.   . traMADol (ULTRAM) 50 MG tablet Take 1 tablet (50 mg total) by mouth 3 (three) times daily.  [place] amoxicillin-clavulanate (AUGMENTIN) 875-125 MG tablet Take 1 tablet by mouth 2 (two) times daily.   No facility-administered encounter medications on file as of 10/26/2018.       Medical History: Past Medical History:  Diagnosis Date  . Hyperlipidemia   . Hypertension   . Insomnia   . Osteoarthritis     Today's Vitals   10/26/18 1407  BP: (!) 143/86  Pulse: (!) 105  Resp: 16  Temp: 98.1 F (36.7 C)  SpO2: 92%  Weight: 199 lb (90.3 kg)  Height: 6' (1.829 m)    Review of Systems  Constitutional: Positive for fatigue. Negative for chills, fever and unexpected weight change.  HENT: Positive for congestion, postnasal drip,  rhinorrhea, sinus pressure and sinus pain. Negative for sneezing and sore throat.   Respiratory: Positive for cough. Negative for chest tightness, shortness of breath and wheezing.   Cardiovascular: Negative for chest pain and palpitations.  Gastrointestinal: Positive for abdominal pain. Negative for constipation, diarrhea, nausea and vomiting.  Genitourinary: Negative for dysuria and frequency.  Musculoskeletal: Negative for arthralgias, back pain, joint swelling and neck pain.  Skin: Negative for rash.  Neurological: Positive for headaches. Negative for tremors and numbness.  Hematological: Positive for adenopathy. Does not bruise/bleed easily.  Psychiatric/Behavioral: Negative for behavioral problems (Depression), sleep disturbance and suicidal ideas. The patient is not nervous/anxious.     Physical Exam  Constitutional: He is oriented to person, place, and time. He appears well-developed and well-nourished. He appears ill. No distress.  HENT:  Head: Normocephalic and atraumatic.  Right Ear: Tympanic membrane is erythematous.  Left Ear: Tympanic membrane is erythematous.  Nose: Rhinorrhea present. Right sinus exhibits frontal sinus tenderness. Left sinus exhibits frontal sinus tenderness.  Mouth/Throat: Posterior oropharyngeal edema and posterior oropharyngeal erythema present. No oropharyngeal exudate.  Eyes: Pupils are equal, round, and reactive to light. EOM are normal.  Neck: Normal range of motion. Neck supple. No JVD present. No tracheal deviation present. No thyromegaly present.  Cardiovascular: Normal rate, regular rhythm and normal heart sounds. Exam reveals no gallop and no friction  rub.  No murmur heard. Mild tachycardia  Pulmonary/Chest: Effort normal. No respiratory distress. He has wheezes. He has no rales. He exhibits no tenderness.  Xongested, non-productive cough present.   Musculoskeletal: Normal range of motion.  Lymphadenopathy:    He has cervical adenopathy.   Neurological: He is alert and oriented to person, place, and time. No cranial nerve deficit.  Skin: Skin is warm and dry. He is not diaphoretic.  Psychiatric: He has a normal mood and affect. His behavior is normal. Judgment and thought content normal.  Nursing note and vitals reviewed.  Assessment/Plan: 1. Acute upper respiratory infection Treat with augmentin 875mg  bid for 10 days. Rest and increase fluids. Recommend use of OTC medication to alleviate symptoms.  - amoxicillin-clavulanate (AUGMENTIN) 875-125 MG tablet; Take 1 tablet by mouth 2 (two) times daily.  Dispense: 20 tablet; Refill: 0  2. Flu-like symptoms - POCT Influenza A/B negative - will treat for URI  3. Hypertension, unspecified type Stable. Continue bp medication as prescribed   4. Right inguinal hernia Scheduled for surgical repair 10/29/2018. Advised he notify the surgeon of URI and let surgeon decide if planned surgery can be done as scheduled.   General Counseling: Terry Harvey verbalizes understanding of the findings of todays visit and agrees with plan of treatment. I have discussed any further diagnostic evaluation that may be needed or ordered today. We also reviewed his medications today. he has been encouraged to call the office with any questions or concerns that should arise related to todays visit.    Counseling:  Rest and increase fluids. Continue using OTC medication to control symptoms.   This patient was seen by Terry Pol FNP Collaboration with Dr Terry Harvey as a part of collaborative care agreement  Orders Placed This Encounter  Procedures  . POCT Influenza A/B    Meds ordered this encounter  Medications  . amoxicillin-clavulanate (AUGMENTIN) 875-125 MG tablet    Sig: Take 1 tablet by mouth 2 (two) times daily.    Dispense:  20 tablet    Refill:  0    Order Specific Question:   Supervising Provider    Answer:   Terry Harvey [[id number]]    Time spent: 25 Minutes

## 2018-10-27 ENCOUNTER — Ambulatory Visit: Payer: Self-pay | Admitting: Nurse Practitioner

## 2018-10-27 ENCOUNTER — Inpatient Hospital Stay: Admission: RE | Admit: 2018-10-27 | Payer: Medicare Other | Source: Ambulatory Visit

## 2018-10-29 ENCOUNTER — Encounter: Payer: Self-pay | Admitting: Nurse Practitioner

## 2018-11-05 ENCOUNTER — Other Ambulatory Visit: Payer: Self-pay

## 2018-11-05 ENCOUNTER — Ambulatory Visit (INDEPENDENT_AMBULATORY_CARE_PROVIDER_SITE_OTHER): Payer: Medicare Other | Admitting: Nurse Practitioner

## 2018-11-05 ENCOUNTER — Encounter: Payer: Self-pay | Admitting: Nurse Practitioner

## 2018-11-05 ENCOUNTER — Encounter
Admission: RE | Admit: 2018-11-05 | Discharge: 2018-11-05 | Disposition: A | Discharge status: Home / Self Care | Payer: Medicare Other | Source: Ambulatory Visit | Attending: Surgery | Admitting: Surgery

## 2018-11-05 VITALS — BP 152/87 | HR 76 | Resp 16 | Ht 72.0 in | Wt 198.0 lb

## 2018-11-05 DIAGNOSIS — Z0001 Encounter for general adult medical examination with abnormal findings: Secondary | ICD-10-CM

## 2018-11-05 DIAGNOSIS — Z23 Encounter for immunization: Secondary | ICD-10-CM | POA: Diagnosis not present

## 2018-11-05 DIAGNOSIS — I10 Essential (primary) hypertension: Secondary | ICD-10-CM

## 2018-11-05 DIAGNOSIS — R300 Dysuria: Secondary | ICD-10-CM

## 2018-11-05 DIAGNOSIS — K4090 Unilateral inguinal hernia, without obstruction or gangrene, not specified as recurrent: Secondary | ICD-10-CM

## 2018-11-05 MED ORDER — BISOPROLOL FUMARATE 5 MG PO TABS: 5.0000 mg | ORAL_TABLET | Freq: Every day | ORAL | 5 refills | Status: DC

## 2018-11-05 MED ORDER — HYDROCHLOROTHIAZIDE 12.5 MG PO TABS: 6.2500 mg | ORAL_TABLET | Freq: Every day | ORAL | 3 refills | Status: DC

## 2018-11-05 NOTE — Progress Notes (Signed)
[facility] [place], [place] [address]  Internal MEDICINE  Office Visit Note  Patient Name: Terry Harvey  [id number]  [id number]  Date of Service: 11/08/2018   Pt is here for routine health maintenance examination   Chief Complaint  Patient presents with  . Annual Exam  . Hypertension  . Hyperlipidemia     The patient is here for routine health maintenance exam. Was recently treated with augmentin 875mg  twice daily for bronchitis at his last visit. He is feeling much better. Will be having surgery to repair right inguinal hernia later this month. Needs to have routine, fasting labs drawn. Blood pressure is stable. He needs to have prevnar 13 vaccine.    Current Medication: Outpatient Encounter Medications as of 11/05/2018  Medication Sig  . metoprolol succinate (TOPROL-XL) 50 MG 24 hr tablet Take 1 tablet (50 mg total) by mouth daily.  . Misc Natural Products (OSTEO BI-FLEX JOINT SHIELD PO) Take 2 tablets by mouth daily.   . Multiple Vitamin (ONE-A-DAY MENS PO) Take 1 tablet by mouth daily.   . naproxen sodium (ANAPROX) 220 MG tablet Take 220 mg by mouth 2 (two) times daily.   Marland Kitchen amoxicillin-clavulanate (AUGMENTIN) 875-125 MG tablet Take 1 tablet by mouth 2 (two) times daily. (Patient not taking: Reported on 11/05/2018)  . bisoprolol (ZEBETA) 5 MG tablet Take 1 tablet (5 mg total) by mouth daily.  . hydrochlorothiazide (HYDRODIURIL) 12.5 MG tablet Take 0.5 tablets (6.25 mg total) by mouth daily.  . traMADol (ULTRAM) 50 MG tablet Take 1 tablet (50 mg total) by mouth 3 (three) times daily.   No facility-administered encounter medications on file as of 11/05/2018.     Surgical History: Past Surgical History:  Procedure Laterality Date  . APPENDECTOMY  2009  . TUMOR REMOVAL  2002   from right side of neck    Medical History: Past Medical History:  Diagnosis Date  . Hyperlipidemia   . Hypertension   . Insomnia   . Osteoarthritis      Family History: Family History  Problem Relation Age of Onset  . Diabetes Mellitus II Mother   . Hypertension Father       Review of Systems  Constitutional: Negative for chills, fatigue, fever and unexpected weight change.  HENT: Negative for congestion, postnasal drip, rhinorrhea, sinus pressure, sinus pain, sneezing and sore throat.   Respiratory: Negative for cough, chest tightness, shortness of breath and wheezing.   Cardiovascular: Negative for chest pain and palpitations.  Gastrointestinal: Positive for abdominal pain. Negative for constipation, diarrhea, nausea and vomiting.  Endocrine: Negative for cold intolerance, heat intolerance, polydipsia and polyuria.  Genitourinary: Negative for dysuria and frequency.  Musculoskeletal: Negative for arthralgias, back pain, joint swelling and neck pain.  Skin: Negative for rash.  Allergic/Immunologic: Negative for environmental allergies.  Neurological: Negative for tremors, numbness and headaches.  Hematological: Negative for adenopathy. Does not bruise/bleed easily.  Psychiatric/Behavioral: Negative for behavioral problems (Depression), sleep disturbance and suicidal ideas. The patient is not nervous/anxious.      Today's Vitals   11/05/18 1557  BP: (!) 152/87  Pulse: 76  Resp: 16  SpO2: 94%  Weight: 198 lb (89.8 kg)  Height: 6' (1.829 m)    Physical Exam Vitals signs and nursing note reviewed.  Constitutional:      General: He is not in acute distress.    Appearance: Normal appearance. He is well-developed. He is not ill-appearing or diaphoretic.  HENT:     Head:  Normocephalic and atraumatic.     Right Ear: External ear normal. Tympanic membrane is not erythematous.     Left Ear: External ear normal. Tympanic membrane is not erythematous.     Nose: Nose normal. No rhinorrhea.     Right Sinus: No frontal sinus tenderness.     Left Sinus: No frontal sinus tenderness.     Mouth/Throat:     Pharynx: No  oropharyngeal exudate or posterior oropharyngeal erythema.  Eyes:     Extraocular Movements: Extraocular movements intact.     Pupils: Pupils are equal, round, and reactive to light.  Neck:     Musculoskeletal: Normal range of motion and neck supple.     Thyroid: No thyromegaly.     Vascular: No carotid bruit or JVD.     Trachea: No tracheal deviation.  Cardiovascular:     Rate and Rhythm: Normal rate. Rhythm irregular.     Pulses: Normal pulses.     Heart sounds: Normal heart sounds. No murmur. No friction rub. No gallop.      Comments: Mild tachycardia Pulmonary:     Effort: Pulmonary effort is normal. No respiratory distress.     Breath sounds: Normal breath sounds. No wheezing or rales.  Chest:     Chest wall: No tenderness.  Abdominal:     General: Abdomen is flat. Bowel sounds are normal.     Palpations: Abdomen is soft.     Hernia: A hernia is present.  Musculoskeletal: Normal range of motion.  Lymphadenopathy:     Cervical: No cervical adenopathy.  Skin:    General: Skin is warm and dry.     Capillary Refill: Capillary refill takes less than 2 seconds.  Neurological:     General: No focal deficit present.     Mental Status: He is alert and oriented to person, place, and time.     Cranial Nerves: No cranial nerve deficit.  Psychiatric:        Mood and Affect: Mood normal.        Behavior: Behavior normal.        Thought Content: Thought content normal.        Judgment: Judgment normal.    Depression screen [facility] 2/9 11/05/2018 10/26/2018 07/06/2018 06/30/2018 04/30/2018  Decreased Interest 0 0 0 0 0  Down, Depressed, Hopeless 0 0 0 0 0  PHQ - 2 Score 0 0 0 0 0    Functional Status Survey: Is the patient deaf or have difficulty hearing?: No Does the patient have difficulty seeing, even when wearing glasses/contacts?: No Does the patient have difficulty concentrating, remembering, or making decisions?: No Does the patient have difficulty walking or climbing stairs?:  No Does the patient have difficulty dressing or bathing?: No Does the patient have difficulty doing errands alone such as visiting a doctor's office or shopping?: No  No flowsheet data found.  Fall Risk  11/05/2018 10/26/2018 10/23/2018 10/07/2018 07/06/2018  Falls in the past year? 0 0 0 0 No  Number falls in past yr: 0 0 - - -  Injury with Fall? 0 0 - - -      LABS: Recent Results (from the past 2160 hour(s))  I-STAT creatinine     Status: None   Collection Time: 10/20/18 11:51 AM  Result Value Ref Range   Creatinine, Ser 1.20 0.61 - 1.24 mg/dL  POCT Influenza A/B     Status: Normal   Collection Time: 10/26/18  2:44 PM  Result Value Ref Range  Influenza A, POC Negative Negative   Influenza B, POC    UA/M w/rflx Culture, Routine     Status: Abnormal   Collection Time: 11/05/18  3:59 PM  Result Value Ref Range   Specific Gravity, UA 1.013 1.005 - 1.030   pH, UA 7.5 5.0 - 7.5   Color, UA Yellow Yellow   Appearance Ur Cloudy (A) Clear   Leukocytes, UA Negative Negative   Protein, UA Negative Negative/Trace   Glucose, UA Negative Negative   Ketones, UA Negative Negative   RBC, UA Negative Negative   Bilirubin, UA Negative Negative   Urobilinogen, Ur 0.2 0.2 - 1.0 mg/dL   Nitrite, UA Negative Negative   Microscopic Examination Comment     Comment: Microscopic follows if indicated.   Microscopic Examination See below:     Comment: Microscopic was indicated and was performed.   Urinalysis Reflex Comment     Comment: This specimen will not reflex to a Urine Culture.  Microscopic Examination     Status: None   Collection Time: 11/05/18  3:59 PM  Result Value Ref Range   WBC, UA 0-5 0 - 5 /hpf   RBC, UA None seen 0 - 2 /hpf   Epithelial Cells (non renal) 0-10 0 - 10 /hpf   Casts None seen None seen /lpf   Mucus, UA Present Not Estab.   Bacteria, UA None seen None seen/Few    Assessment/Plan: 1. Encounter for routine adult health examination with abnormal  findings Annual health maintenance exam today  2. Hypertension, unspecified type Stable. Continue bisoprolol and HCTZ as prescribed. Refills provided today.  - bisoprolol (ZEBETA) 5 MG tablet; Take 1 tablet (5 mg total) by mouth daily.  Dispense: 30 tablet; Refill: 5 - hydrochlorothiazide (HYDRODIURIL) 12.5 MG tablet; Take 0.5 tablets (6.25 mg total) by mouth daily.  Dispense: 15 tablet; Refill: 3  3. Right inguinal hernia Surgery for repair rescheduled for later this month.   4. Need for vaccination against Streptococcus pneumoniae using pneumococcal conjugate vaccine 13 Prescription for Prevnar 13 written and given to patient. Recommended he have this vaccine following his surgery to repair inguinal hernia.  - Pneumococcal conjugate vaccine 13-valent IM  5. Dysuria - UA/M w/rflx Culture, Routine  General Counseling: Junaid verbalizes understanding of the findings of todays visit and agrees with plan of treatment. I have discussed any further diagnostic evaluation that may be needed or ordered today. We also reviewed his medications today. he has been encouraged to call the office with any questions or concerns that should arise related to todays visit.    Counseling:  Hypertension Counseling:   The following hypertensive lifestyle modification were recommended and discussed:  1. Limiting alcohol intake to less than 1 oz/day of ethanol:(24 oz of beer or 8 oz of wine or 2 oz of 100-proof whiskey). 2. Take baby ASA 81 mg daily. 3. Importance of regular aerobic exercise and losing weight. 4. Reduce dietary saturated fat and cholesterol intake for overall cardiovascular health. 5. Maintaining adequate dietary potassium, calcium, and magnesium intake. 6. Regular monitoring of the blood pressure. 7. Reduce sodium intake to less than 100 mmol/day (less than 2.3 gm of sodium or less than 6 gm of sodium choride)   This patient was seen by [place] with Dr Lavera Guise as a part of collaborative care agreement  Orders Placed This Encounter  Procedures  . Microscopic Examination  . Pneumococcal conjugate vaccine 13-valent IM  . UA/M w/rflx Culture, Routine  Meds ordered this encounter  Medications  . bisoprolol (ZEBETA) 5 MG tablet    Sig: Take 1 tablet (5 mg total) by mouth daily.    Dispense:  30 tablet    Refill:  5    Please d/c metoprolol as this is not effective for his blood pressure    Order Specific Question:   Supervising Provider    Answer:   Lavera Guise [place]  . hydrochlorothiazide (HYDRODIURIL) 12.5 MG tablet    Sig: Take 0.5 tablets (6.25 mg total) by mouth daily.    Dispense:  15 tablet    Refill:  3    Please d/c metoprolol as this is not working.    Order Specific Question:   Supervising Provider    Answer:   Lavera Guise [[id number]]    Time spent: [place], MD  Internal Medicine

## 2018-11-05 NOTE — Patient Instructions (Signed)
Your procedure is scheduled on:11/12/18 Report to Day Surgery. [facility] SECOND FLOOR To find out your arrival time please call [phone] between 1PM - 3PM on 11/10/18.  Remember: Instructions that are not followed completely may result in serious medical risk,  up to and including death, or upon the discretion of your surgeon and anesthesiologist your  surgery may need to be rescheduled.     _X__ 1. Do not eat food after midnight the night before your procedure.                 No gum chewing or hard candies. You may drink clear liquids up to 2 hours                 before you are scheduled to arrive for your surgery- DO not drink clear                 liquids within 2 hours of the start of your surgery.                 Clear Liquids include:  water, apple juice without pulp, clear carbohydrate                 drink such as Clearfast of Gatorade, Black Coffee or Tea (Do not add                 anything to coffee or tea).  __X__2.  On the morning of surgery brush your teeth with toothpaste and water, you                may rinse your mouth with mouthwash if you wish.  Do not swallow any toothpaste of mouthwash.     _X__ 3.  No Alcohol for 24 hours before or after surgery.   _X__ 4.  Do Not Smoke or use e-cigarettes For 24 Hours Prior to Your Surgery.                 Do not use any chewable tobacco products for at least 6 hours prior to                 surgery.  ____  5.  Bring all medications with you on the day of surgery if instructed.   __X__  6.  Notify your doctor if there is any change in your medical condition      (cold, fever, infections).     Do not wear jewelry, make-up, hairpins, clips or nail polish. Do not wear lotions, powders, or perfumes. You may wear deodorant. Do not shave 48 hours prior to surgery. Men may shave face and neck. Do not bring valuables to the hospital.    [facility] is not responsible for any belongings or  valuables.  Contacts, dentures or bridgework may not be worn into surgery. Leave your suitcase in the car. After surgery it may be brought to your room. For patients admitted to the hospital, discharge time is determined by your treatment team.   Patients discharged the day of surgery will not be allowed to drive home.   Please read over the following fact sheets that you were given:   Surgical Site Infection Prevention    ___X_ Take these medicines the morning of surgery with A SIP OF WATER:    1. METOPROLOL  2. TRAMADOL  3.   4.  5.  6.  ____ Fleet Enema (as directed)   _X___ Use CHG Soap as directed  ____ Use inhalers on the day of surgery  ____ Stop metformin 2 days prior to surgery    ____ Take 1/2 of usual insulin dose the night before surgery. No insulin the morning          of surgery.   ____ Stop Coumadin/Plavix/aspirin on  _X___ Stop Anti-inflammatories on      STOP NAPROXEN 11/05/18 UNTIL AFTER SURGERY    MAY TAKE TYLENOL IF NEEDED   _X___ Stop supplements until after surgery.    STOP GLUCOSAMIN 11/05/18 UNTIL AFTER SURGERY  ____ Bring C-Pap to the hospital.

## 2018-11-06 ENCOUNTER — Encounter
Admission: RE | Admit: 2018-11-06 | Discharge: 2018-11-06 | Disposition: A | Discharge status: Home / Self Care | Payer: Medicare Other | Source: Ambulatory Visit | Attending: Surgery | Admitting: Surgery

## 2018-11-06 DIAGNOSIS — I10 Essential (primary) hypertension: Secondary | ICD-10-CM | POA: Diagnosis not present

## 2018-11-06 DIAGNOSIS — Z01810 Encounter for preprocedural cardiovascular examination: Secondary | ICD-10-CM | POA: Insufficient documentation

## 2018-11-06 LAB — UA/M W/RFLX CULTURE, ROUTINE
Bilirubin, UA: NEGATIVE
Glucose, UA: NEGATIVE
Ketones, UA: NEGATIVE
Leukocytes, UA: NEGATIVE
Nitrite, UA: NEGATIVE
PH UA: 7.5 (ref 5.0–7.5)
Protein, UA: NEGATIVE
RBC, UA: NEGATIVE
Specific Gravity, UA: 1.013 (ref 1.005–1.030)
Urobilinogen, Ur: 0.2 mg/dL (ref 0.2–1.0)

## 2018-11-06 LAB — MICROSCOPIC EXAMINATION
Bacteria, UA: NONE SEEN
CASTS: NONE SEEN /LPF
RBC, UA: NONE SEEN /hpf (ref 0–2)

## 2018-11-08 DIAGNOSIS — Z0001 Encounter for general adult medical examination with abnormal findings: Secondary | ICD-10-CM | POA: Insufficient documentation

## 2018-11-08 DIAGNOSIS — R300 Dysuria: Secondary | ICD-10-CM | POA: Insufficient documentation

## 2018-11-08 DIAGNOSIS — Z23 Encounter for immunization: Secondary | ICD-10-CM | POA: Insufficient documentation

## 2018-11-11 MED ORDER — CEFAZOLIN SODIUM-DEXTROSE 2-4 GM/100ML-% IV SOLN
2.0000 g | INTRAVENOUS | Status: AC
  Administered 2018-11-12: 2 g via INTRAVENOUS

## 2018-11-12 ENCOUNTER — Ambulatory Visit: Payer: Medicare Other | Admitting: Anesthesiology

## 2018-11-12 ENCOUNTER — Ambulatory Visit
Admission: RE | Admit: 2018-11-12 | Discharge: 2018-11-12 | Disposition: A | Discharge status: Home / Self Care | Payer: Medicare Other | Attending: Surgery | Admitting: Surgery

## 2018-11-12 ENCOUNTER — Other Ambulatory Visit: Payer: Self-pay

## 2018-11-12 ENCOUNTER — Encounter: Payer: Self-pay | Admitting: Anesthesiology

## 2018-11-12 ENCOUNTER — Encounter
Admission: RE | Disposition: A | Discharge status: Home / Self Care | Payer: Self-pay | Source: Home / Self Care | Attending: Surgery

## 2018-11-12 DIAGNOSIS — E785 Hyperlipidemia, unspecified: Secondary | ICD-10-CM | POA: Diagnosis not present

## 2018-11-12 DIAGNOSIS — D176 Benign lipomatous neoplasm of spermatic cord: Secondary | ICD-10-CM | POA: Diagnosis not present

## 2018-11-12 DIAGNOSIS — Z79899 Other long term (current) drug therapy: Secondary | ICD-10-CM | POA: Diagnosis not present

## 2018-11-12 DIAGNOSIS — Z79891 Long term (current) use of opiate analgesic: Secondary | ICD-10-CM | POA: Insufficient documentation

## 2018-11-12 DIAGNOSIS — G4700 Insomnia, unspecified: Secondary | ICD-10-CM | POA: Diagnosis not present

## 2018-11-12 DIAGNOSIS — I10 Essential (primary) hypertension: Secondary | ICD-10-CM | POA: Diagnosis not present

## 2018-11-12 DIAGNOSIS — Z87891 Personal history of nicotine dependence: Secondary | ICD-10-CM | POA: Insufficient documentation

## 2018-11-12 DIAGNOSIS — Z791 Long term (current) use of non-steroidal anti-inflammatories (NSAID): Secondary | ICD-10-CM | POA: Diagnosis not present

## 2018-11-12 DIAGNOSIS — K4090 Unilateral inguinal hernia, without obstruction or gangrene, not specified as recurrent: Secondary | ICD-10-CM | POA: Diagnosis not present

## 2018-11-12 HISTORY — PX: INGUINAL HERNIA REPAIR: SHX194

## 2018-11-12 SURGERY — REPAIR, HERNIA, INGUINAL, ADULT
Anesthesia: General | Laterality: Right

## 2018-11-12 MED ORDER — GABAPENTIN 300 MG PO CAPS
ORAL_CAPSULE | ORAL | Status: AC
  Administered 2018-11-12: 300 mg via ORAL
  Filled 2018-11-12: qty 1

## 2018-11-12 MED ORDER — CEFAZOLIN SODIUM-DEXTROSE 2-4 GM/100ML-% IV SOLN
INTRAVENOUS | Status: AC
  Filled 2018-11-12: qty 100

## 2018-11-12 MED ORDER — FENTANYL CITRATE (PF) 100 MCG/2ML IJ SOLN
INTRAMUSCULAR | Status: AC
  Filled 2018-11-12: qty 2

## 2018-11-12 MED ORDER — BUPIVACAINE LIPOSOME 1.3 % IJ SUSP: 20.0000 mL | Freq: Once | INTRAMUSCULAR | Status: DC

## 2018-11-12 MED ORDER — LACTATED RINGERS IV SOLN
INTRAVENOUS | Status: DC
  Administered 2018-11-12 (×2): via INTRAVENOUS

## 2018-11-12 MED ORDER — KETOROLAC TROMETHAMINE 30 MG/ML IJ SOLN
INTRAMUSCULAR | Status: AC
  Filled 2018-11-12: qty 1

## 2018-11-12 MED ORDER — FAMOTIDINE 20 MG PO TABS
20.0000 mg | ORAL_TABLET | Freq: Once | ORAL | Status: AC
  Administered 2018-11-12: 20 mg via ORAL

## 2018-11-12 MED ORDER — SUGAMMADEX SODIUM 200 MG/2ML IV SOLN
INTRAVENOUS | Status: DC | PRN
  Administered 2018-11-12: 200 mg via INTRAVENOUS

## 2018-11-12 MED ORDER — GLYCOPYRROLATE 0.2 MG/ML IJ SOLN
INTRAMUSCULAR | Status: DC | PRN
  Administered 2018-11-12: 0.2 mg via INTRAVENOUS

## 2018-11-12 MED ORDER — ROCURONIUM BROMIDE 50 MG/5ML IV SOLN
INTRAVENOUS | Status: AC
  Filled 2018-11-12: qty 1

## 2018-11-12 MED ORDER — LIDOCAINE HCL (CARDIAC) PF 100 MG/5ML IV SOSY
PREFILLED_SYRINGE | INTRAVENOUS | Status: DC | PRN
  Administered 2018-11-12: 100 mg via INTRAVENOUS

## 2018-11-12 MED ORDER — ONDANSETRON HCL 4 MG/2ML IJ SOLN
INTRAMUSCULAR | Status: AC
  Filled 2018-11-12: qty 2

## 2018-11-12 MED ORDER — CHLORHEXIDINE GLUCONATE CLOTH 2 % EX PADS: 6.0000 | MEDICATED_PAD | Freq: Once | CUTANEOUS | Status: DC

## 2018-11-12 MED ORDER — ONDANSETRON HCL 4 MG/2ML IJ SOLN
INTRAMUSCULAR | Status: DC | PRN
  Administered 2018-11-12: 4 mg via INTRAVENOUS

## 2018-11-12 MED ORDER — BUPIVACAINE LIPOSOME 1.3 % IJ SUSP
INTRAMUSCULAR | Status: DC | PRN
  Administered 2018-11-12: 20 mL

## 2018-11-12 MED ORDER — ACETAMINOPHEN 500 MG PO TABS
ORAL_TABLET | ORAL | Status: AC
  Administered 2018-11-12: 1000 mg via ORAL
  Filled 2018-11-12: qty 2

## 2018-11-12 MED ORDER — OXYCODONE HCL 5 MG PO TABS: 5.0000 mg | ORAL_TABLET | ORAL | 0 refills | Status: DC | PRN

## 2018-11-12 MED ORDER — MIDAZOLAM HCL 2 MG/2ML IJ SOLN
INTRAMUSCULAR | Status: DC | PRN
  Administered 2018-11-12: 2 mg via INTRAVENOUS

## 2018-11-12 MED ORDER — DEXAMETHASONE SODIUM PHOSPHATE 10 MG/ML IJ SOLN
INTRAMUSCULAR | Status: DC | PRN
  Administered 2018-11-12: 8 mg via INTRAVENOUS

## 2018-11-12 MED ORDER — FAMOTIDINE 20 MG PO TABS
ORAL_TABLET | ORAL | Status: AC
  Administered 2018-11-12: 20 mg via ORAL
  Filled 2018-11-12: qty 1

## 2018-11-12 MED ORDER — FENTANYL CITRATE (PF) 100 MCG/2ML IJ SOLN: 25.0000 ug | INTRAMUSCULAR | Status: DC | PRN

## 2018-11-12 MED ORDER — BUPIVACAINE LIPOSOME 1.3 % IJ SUSP
INTRAMUSCULAR | Status: AC
  Filled 2018-11-12: qty 20

## 2018-11-12 MED ORDER — MIDAZOLAM HCL 2 MG/2ML IJ SOLN
INTRAMUSCULAR | Status: AC
  Filled 2018-11-12: qty 2

## 2018-11-12 MED ORDER — DEXAMETHASONE SODIUM PHOSPHATE 10 MG/ML IJ SOLN
INTRAMUSCULAR | Status: AC
  Filled 2018-11-12: qty 1

## 2018-11-12 MED ORDER — PROPOFOL 10 MG/ML IV BOLUS
INTRAVENOUS | Status: AC
  Filled 2018-11-12: qty 20

## 2018-11-12 MED ORDER — ROCURONIUM BROMIDE 100 MG/10ML IV SOLN
INTRAVENOUS | Status: DC | PRN
  Administered 2018-11-12: 50 mg via INTRAVENOUS

## 2018-11-12 MED ORDER — SUGAMMADEX SODIUM 200 MG/2ML IV SOLN
INTRAVENOUS | Status: AC
  Filled 2018-11-12: qty 2

## 2018-11-12 MED ORDER — KETOROLAC TROMETHAMINE 30 MG/ML IJ SOLN
INTRAMUSCULAR | Status: DC | PRN
  Administered 2018-11-12: 30 mg via INTRAVENOUS

## 2018-11-12 MED ORDER — FENTANYL CITRATE (PF) 100 MCG/2ML IJ SOLN
INTRAMUSCULAR | Status: DC | PRN
  Administered 2018-11-12 (×2): 25 ug via INTRAVENOUS
  Administered 2018-11-12: 50 ug via INTRAVENOUS
  Administered 2018-11-12: 75 ug via INTRAVENOUS
  Administered 2018-11-12 (×4): 25 ug via INTRAVENOUS

## 2018-11-12 MED ORDER — PROPOFOL 10 MG/ML IV BOLUS
INTRAVENOUS | Status: DC | PRN
  Administered 2018-11-12: 170 mg via INTRAVENOUS

## 2018-11-12 MED ORDER — BUPIVACAINE HCL (PF) 0.25 % IJ SOLN
INTRAMUSCULAR | Status: AC
  Filled 2018-11-12: qty 30

## 2018-11-12 MED ORDER — ACETAMINOPHEN 500 MG PO TABS
1000.0000 mg | ORAL_TABLET | ORAL | Status: AC
  Administered 2018-11-12: 1000 mg via ORAL

## 2018-11-12 MED ORDER — BUPIVACAINE HCL (PF) 0.25 % IJ SOLN
INTRAMUSCULAR | Status: DC | PRN
  Administered 2018-11-12: 30 mL

## 2018-11-12 MED ORDER — LIDOCAINE HCL (PF) 2 % IJ SOLN
INTRAMUSCULAR | Status: AC
  Filled 2018-11-12: qty 10

## 2018-11-12 MED ORDER — FENTANYL CITRATE (PF) 250 MCG/5ML IJ SOLN
INTRAMUSCULAR | Status: AC
  Filled 2018-11-12: qty 5

## 2018-11-12 MED ORDER — ONDANSETRON HCL 4 MG/2ML IJ SOLN: 4.0000 mg | Freq: Once | INTRAMUSCULAR | Status: DC | PRN

## 2018-11-12 MED ORDER — GABAPENTIN 300 MG PO CAPS
300.0000 mg | ORAL_CAPSULE | ORAL | Status: AC
  Administered 2018-11-12: 300 mg via ORAL

## 2018-11-12 SURGICAL SUPPLY — 37 items
BLADE CLIPPER SURG (BLADE) ×2 IMPLANT
BLADE SURG 15 STRL LF DISP TIS (BLADE) ×2 IMPLANT
BLADE SURG 15 STRL SS (BLADE) ×2
CANISTER SUCT 1200ML W/VALVE (MISCELLANEOUS) ×2 IMPLANT
CHLORAPREP W/TINT 26ML (MISCELLANEOUS) ×2 IMPLANT
COVER WAND RF STERILE (DRAPES) ×2 IMPLANT
DERMABOND ADVANCED (GAUZE/BANDAGES/DRESSINGS) ×1
DERMABOND ADVANCED .7 DNX12 (GAUZE/BANDAGES/DRESSINGS) ×1 IMPLANT
DRAIN PENROSE 1/4X12 LTX (DRAIN) ×2 IMPLANT
DRAPE LAPAROTOMY 100X77 ABD (DRAPES) ×2 IMPLANT
ELECT CAUTERY BLADE 6.4 (BLADE) ×2 IMPLANT
ELECT REM PT RETURN 9FT ADLT (ELECTROSURGICAL) ×2
ELECTRODE REM PT RTRN 9FT ADLT (ELECTROSURGICAL) ×1 IMPLANT
GAUZE 4X4 16PLY RFD (DISPOSABLE) ×2 IMPLANT
GLOVE SURG SYN 7.0 (GLOVE) ×2 IMPLANT
GLOVE SURG SYN 7.5  E (GLOVE) ×1
GLOVE SURG SYN 7.5 E (GLOVE) ×1 IMPLANT
GOWN STRL REUS W/ TWL LRG LVL3 (GOWN DISPOSABLE) ×2 IMPLANT
GOWN STRL REUS W/TWL LRG LVL3 (GOWN DISPOSABLE) ×2
LABEL OR SOLS (LABEL) ×2 IMPLANT
MESH MARLEX PLUG MEDIUM (Mesh General) ×2 IMPLANT
NEEDLE HYPO 22GX1.5 SAFETY (NEEDLE) ×2 IMPLANT
NS IRRIG 500ML POUR BTL (IV SOLUTION) ×2 IMPLANT
PACK BASIN MINOR ARMC (MISCELLANEOUS) ×2 IMPLANT
SPONGE LAP 18X18 RF (DISPOSABLE) ×2 IMPLANT
SUT MNCRL 4-0 (SUTURE) ×1
SUT MNCRL 4-0 27XMFL (SUTURE) ×1
SUT PROLENE 2 0 SH DA (SUTURE) ×6 IMPLANT
SUT SILK 2 0 (SUTURE) ×1
SUT SILK 2-0 18XBRD TIE 12 (SUTURE) ×1 IMPLANT
SUT VIC AB 2-0 CT1 (SUTURE) ×2 IMPLANT
SUT VIC AB 3-0 SH 27 (SUTURE) ×1
SUT VIC AB 3-0 SH 27X BRD (SUTURE) ×1 IMPLANT
SUTURE MNCRL 4-0 27XMF (SUTURE) ×1 IMPLANT
SYR 10ML LL (SYRINGE) ×2 IMPLANT
SYR 30ML LL (SYRINGE) ×2 IMPLANT
SYR BULB IRRIG 60ML STRL (SYRINGE) ×2 IMPLANT

## 2018-11-12 NOTE — Anesthesia Post-op Follow-up Note (Signed)
Anesthesia QCDR form completed.        

## 2018-11-12 NOTE — Op Note (Signed)
  Procedure Date:  11/12/2018  Pre-operative Diagnosis:  Right inguinal hernia  Post-operative Diagnosis:  Right inguinal hernia  Procedure:  Right Inguinal Hernia Repair  Surgeon:  Melvyn Neth, MD  Anesthesia:  General endotracheal  Estimated Blood Loss:  15 ml  Specimens:  Right cord lipoma and right hernia sac  Complications:  None  Indications for Procedure:  This is a 67 y.o. male who presents with a right inguinal hernia.  The options of surgery versus observation were reviewed with the patient and/or family. The risks of bleeding, abscess or infection, recurrence of symptoms, potential for an open procedure, injury to surrounding structures, and chronic pain were all discussed with the patient and was willing to proceed.  Description of Procedure: The patient was correctly identified in the preoperative area and brought into the operating room.  The patient was placed supine with VTE prophylaxis in place.  Appropriate time-outs were performed.  Anesthesia was induced and the patient was intubated.  Appropriate antibiotics were infused.  The right groin and lower abdomen were prepped and draped in a sterile fashion. An oblique incision was made between the pubic symphysis extending laterally toward the ASIS. Using electrocautery, the subcutaneous tissues were dissected, assuring adequate hemostasis, until reaching the external oblique aponeurosis.  A 1 cm incision was made over the aponeurosis and extended laterally and medially toward the external inguinal ring avoiding injury to the ilioinguinal nerve.  The cord was encircled using a Penrose drain and using medial and lateral retraction, the cord structures were identified and the hernia sac was identified and dissected free.  The sac was entered and freed of any contents carefully and then resected.   A cord lipoma was identified and dissected down to the internal inguinal ring.  At this point it was ligated and resected.  The  stump was pushed back into the abdominal cavity.  A large direct space was also identified and closed with 2-0 Prolene sutures.   A Bard mesh was then placed and sutured to the pubic tubercle, shelving edge, and conjoined tendon with interrupted 2-0 Prolene sutures.  The tails of the mesh were crossed behind the cord and sutured together creating a new internal ring.  The external oblique was then closed in running fashion with 0-0 Vicryl, creating a new external ring.  The wound was irrigated and the incision was closed in three layers with interrupted 2-0 Vicryl, 3-0 Vicryl and running 4-0 Monocryl.  The wound was cleaned and sealed with DermaBond.  The patient was emerged from anesthesia and extubated and brought to the recovery room for further management.  The patient tolerated the procedure well and all counts were correct at the end of the case.   Melvyn Neth, MD

## 2018-11-12 NOTE — Anesthesia Preprocedure Evaluation (Signed)
Anesthesia Evaluation  Patient identified by MRN, date of birth, ID band Patient awake    Reviewed: Allergy & Precautions, NPO status , Patient's Chart, lab work & pertinent test results, reviewed documented beta blocker date and time   Airway Mallampati: III  TM Distance: >3 FB     Dental  (+) Chipped   Pulmonary former smoker,           Cardiovascular hypertension, Pt. on medications and Pt. on home beta blockers      Neuro/Psych    GI/Hepatic   Endo/Other    Renal/GU      Musculoskeletal  (+) Arthritis ,   Abdominal   Peds  Hematology   Anesthesia Other Findings EKG ok. Low sats.  Reproductive/Obstetrics                             Anesthesia Physical Anesthesia Plan  ASA: III  Anesthesia Plan: General   Post-op Pain Management:    Induction: Intravenous  PONV Risk Score and Plan:   Airway Management Planned: LMA  Additional Equipment:   Intra-op Plan:   Post-operative Plan:   Informed Consent: I have reviewed the patients History and Physical, chart, labs and discussed the procedure including the risks, benefits and alternatives for the proposed anesthesia with the patient or authorized representative who has indicated his/her understanding and acceptance.     Plan Discussed with: CRNA  Anesthesia Plan Comments:         Anesthesia Quick Evaluation

## 2018-11-12 NOTE — OR Nursing (Signed)
Discussed discharge instructions with patient and wife. Both voice understanding. 

## 2018-11-12 NOTE — Interval H&P Note (Signed)
History and Physical Interval Note:  11/12/2018 8:39 AM  Terry Harvey.  has presented today for surgery, with the diagnosis of right inguinal hernia  The various methods of treatment have been discussed with the patient and family. After consideration of risks, benefits and other options for treatment, the patient has consented to  Procedure(s): HERNIA REPAIR INGUINAL ADULT (Right) as a surgical intervention .  The patient's history has been reviewed, patient examined, no change in status, stable for surgery.  I have reviewed the patient's chart and labs.  Questions were answered to the patient's satisfaction.     [name]

## 2018-11-12 NOTE — Anesthesia Procedure Notes (Signed)
Procedure Name: LMA Insertion Date/Time: 11/12/2018 9:12 AM Performed by: Eben Burow, CRNA Pre-anesthesia Checklist: Patient identified, Emergency Drugs available, Suction available and Patient being monitored Patient Re-evaluated:Patient Re-evaluated prior to induction Oxygen Delivery Method: Circle system utilized Preoxygenation: Pre-oxygenation with 100% oxygen Induction Type: IV induction LMA: LMA inserted LMA Size: 5.0 Number of attempts: 1 Placement Confirmation: positive ETCO2 and breath sounds checked- equal and bilateral Tube secured with: Tape Dental Injury: Teeth and Oropharynx as per pre-operative assessment

## 2018-11-12 NOTE — Transfer of Care (Signed)
Immediate Anesthesia Transfer of Care Note  Patient: Terry Harvey.  Procedure(s) Performed: HERNIA REPAIR INGUINAL ADULT (Right )  Patient Location: PACU  Anesthesia Type:General  Level of Consciousness: drowsy  Airway & Oxygen Therapy: Patient Spontanous Breathing and Patient connected to face mask oxygen  Post-op Assessment: Report given to RN and Post -op Vital signs reviewed and stable  Post vital signs: Reviewed and stable  Last Vitals:  Vitals Value Taken Time  BP 145/91 11/12/2018 11:38 AM  Temp 36.6 C 11/12/2018 11:38 AM  Pulse 77 11/12/2018 11:41 AM  Resp 15 11/12/2018 11:41 AM  SpO2 93 % 11/12/2018 11:41 AM  Vitals shown include unvalidated device data.  Last Pain:  Vitals:   11/12/18 1138  TempSrc:   PainSc: Asleep         Complications: No apparent anesthesia complications

## 2018-11-12 NOTE — Anesthesia Procedure Notes (Signed)
Procedure Name: Intubation Date/Time: 11/12/2018 10:17 AM Performed by: Eben Burow, CRNA Pre-anesthesia Checklist: Emergency Drugs available, Suction available, Patient being monitored and Patient identified Patient Re-evaluated:Patient Re-evaluated prior to induction Oxygen Delivery Method: Circle system utilized Preoxygenation: Pre-oxygenation with 100% oxygen Induction Type: Inhalational induction Laryngoscope Size: McGraph and 4 Grade View: Grade I Tube type: Oral Tube size: 7.5 mm Number of attempts: 1 Airway Equipment and Method: Stylet and Video-laryngoscopy Placement Confirmation: positive ETCO2 and breath sounds checked- equal and bilateral Secured at: 22 cm Tube secured with: Tape Dental Injury: Teeth and Oropharynx as per pre-operative assessment  Comments: Surgeon requesting GETA with muscle paralysis due to large hernia and difficulty with reducing hernia. Dr. Marcello Moores notified and present for LMA removal and ETT placement.

## 2018-11-13 ENCOUNTER — Encounter: Payer: Self-pay | Admitting: Surgery

## 2018-11-13 LAB — SURGICAL PATHOLOGY

## 2018-11-13 NOTE — Anesthesia Postprocedure Evaluation (Signed)
Anesthesia Post Note  Patient: Terry Harvey.  Procedure(s) Performed: HERNIA REPAIR INGUINAL ADULT (Right )  Patient location during evaluation: PACU Anesthesia Type: General Level of consciousness: awake and alert Pain management: pain level controlled Vital Signs Assessment: post-procedure vital signs reviewed and stable Respiratory status: spontaneous breathing, nonlabored ventilation, respiratory function stable and patient connected to nasal cannula oxygen Cardiovascular status: blood pressure returned to baseline and stable Postop Assessment: no apparent nausea or vomiting Anesthetic complications: no     Last Vitals:  Vitals:   11/12/18 1230 11/12/18 1300  BP: (!) 150/87 (!) 145/80  Pulse: 89 85  Resp: 16   Temp: 36.5 C   SpO2: 95%     Last Pain:  Vitals:   11/13/18 0805  TempSrc:   PainSc: 0-No pain                 Estelle Skibicki S

## 2018-11-16 NOTE — Addendum Note (Signed)
Addendum  created 11/16/18 1541 by Doreen Salvage, CRNA   Charge Capture section accepted

## 2018-11-25 ENCOUNTER — Other Ambulatory Visit: Payer: Self-pay | Admitting: Nurse Practitioner

## 2018-11-25 DIAGNOSIS — M170 Bilateral primary osteoarthritis of knee: Secondary | ICD-10-CM

## 2018-11-25 MED ORDER — TRAMADOL HCL 50 MG PO TABS: 50.0000 mg | ORAL_TABLET | Freq: Three times a day (TID) | ORAL | 1 refills | Status: DC

## 2018-11-25 NOTE — Progress Notes (Signed)
Refilled 90 day prescription for patient's tramadol 50mg  per pharmacy request.

## 2018-11-27 ENCOUNTER — Other Ambulatory Visit: Payer: Self-pay

## 2018-11-27 ENCOUNTER — Encounter: Payer: Self-pay | Admitting: Surgery

## 2018-11-27 ENCOUNTER — Ambulatory Visit (INDEPENDENT_AMBULATORY_CARE_PROVIDER_SITE_OTHER): Payer: Medicare Other | Admitting: Surgery

## 2018-11-27 VITALS — BP 140/78 | HR 79 | Temp 98.1°F | Resp 14 | Ht 72.0 in | Wt 199.0 lb

## 2018-11-27 DIAGNOSIS — Z09 Encounter for follow-up examination after completed treatment for conditions other than malignant neoplasm: Secondary | ICD-10-CM

## 2018-11-27 DIAGNOSIS — K4090 Unilateral inguinal hernia, without obstruction or gangrene, not specified as recurrent: Secondary | ICD-10-CM

## 2018-11-27 NOTE — Progress Notes (Signed)
11/27/2018  HPI: Terry Harvey. is a 68 y.o. male s/p right inguinal hernia repair on 12/26.  Presents for follow up. Denies any significant pain and reports that the swelling is going down.  Vital signs: BP 140/78   Pulse 79   Temp 98.1 F (36.7 C)   Resp 14   Ht 6' (1.829 m)   Wt 199 lb (90.3 kg)   BMI 26.99 kg/m    Physical Exam: Constitutional: No acute distress Abdomen: Right inguinal incision is clean dry and intact and healing well.  There is mild swelling at the incision site itself with some firmness beneath the scar related to healing process.  There is no evidence of any recurrence.  Assessment/Plan: This is a 68 y.o. male s/p open right inguinal hernia repair  - Patient is healing well and reassured him that currently the swelling will continue to go down as well as the firmness. -Patient will follow-up PRN.   Melvyn Neth, [facility]

## 2018-11-27 NOTE — Patient Instructions (Signed)
Patient is to return to the office as needed.  Call the office with any questions or concerns. 

## 2019-02-01 ENCOUNTER — Other Ambulatory Visit: Payer: Self-pay | Admitting: Nurse Practitioner

## 2019-02-01 DIAGNOSIS — E782 Mixed hyperlipidemia: Secondary | ICD-10-CM | POA: Diagnosis not present

## 2019-02-01 DIAGNOSIS — Z0001 Encounter for general adult medical examination with abnormal findings: Secondary | ICD-10-CM | POA: Diagnosis not present

## 2019-02-01 DIAGNOSIS — I10 Essential (primary) hypertension: Secondary | ICD-10-CM | POA: Diagnosis not present

## 2019-02-01 DIAGNOSIS — Z125 Encounter for screening for malignant neoplasm of prostate: Secondary | ICD-10-CM | POA: Diagnosis not present

## 2019-02-02 LAB — CBC
HEMOGLOBIN: 15 g/dL (ref 13.0–17.7)
Hematocrit: 43.3 % (ref 37.5–51.0)
MCH: 31.1 pg (ref 26.6–33.0)
MCHC: 34.6 g/dL (ref 31.5–35.7)
MCV: 90 fL (ref 79–97)
Platelets: 261 10*3/uL (ref 150–450)
RBC: 4.82 x10E6/uL (ref 4.14–5.80)
RDW: 12.7 % (ref 11.6–15.4)
WBC: 8.5 10*3/uL (ref 3.4–10.8)

## 2019-02-02 LAB — COMPREHENSIVE METABOLIC PANEL
ALT: 29 IU/L (ref 0–44)
AST: 23 IU/L (ref 0–40)
Albumin/Globulin Ratio: 1.5 (ref 1.2–2.2)
Albumin: 4.2 g/dL (ref 3.8–4.8)
Alkaline Phosphatase: 62 IU/L (ref 39–117)
BUN/Creatinine Ratio: 9 — ABNORMAL LOW (ref 10–24)
BUN: 11 mg/dL (ref 8–27)
Bilirubin Total: 0.4 mg/dL (ref 0.0–1.2)
CO2: 27 mmol/L (ref 20–29)
CREATININE: 1.17 mg/dL (ref 0.76–1.27)
Calcium: 9.3 mg/dL (ref 8.6–10.2)
Chloride: 101 mmol/L (ref 96–106)
GFR calc non Af Amer: 64 mL/min/{1.73_m2} (ref >59)
GFR, EST AFRICAN AMERICAN: 74 mL/min/{1.73_m2} (ref >59)
Globulin, Total: 2.8 g/dL (ref 1.5–4.5)
Glucose: 95 mg/dL (ref 65–99)
Potassium: 4.3 mmol/L (ref 3.5–5.2)
Sodium: 142 mmol/L (ref 134–144)
Total Protein: 7 g/dL (ref 6.0–8.5)

## 2019-02-02 LAB — LIPID PANEL W/O CHOL/HDL RATIO
Cholesterol, Total: 184 mg/dL (ref 100–199)
HDL: 39 mg/dL — ABNORMAL LOW (ref >39)
LDL CALC: 88 mg/dL (ref 0–99)
Triglycerides: 287 mg/dL — ABNORMAL HIGH (ref 0–149)
VLDL Cholesterol Cal: 57 mg/dL — ABNORMAL HIGH (ref 5–40)

## 2019-02-02 LAB — PSA: Prostate Specific Ag, Serum: 0.7 ng/mL (ref 0.0–4.0)

## 2019-02-02 LAB — TSH: TSH: 1.63 u[IU]/mL (ref 0.450–4.500)

## 2019-02-02 LAB — T4, FREE: FREE T4: 0.97 ng/dL (ref 0.82–1.77)

## 2019-02-05 ENCOUNTER — Encounter: Payer: Self-pay | Admitting: Nurse Practitioner

## 2019-02-05 ENCOUNTER — Ambulatory Visit (INDEPENDENT_AMBULATORY_CARE_PROVIDER_SITE_OTHER): Payer: Medicare Other | Admitting: Nurse Practitioner

## 2019-02-05 ENCOUNTER — Other Ambulatory Visit: Payer: Self-pay

## 2019-02-05 VITALS — BP 143/86 | HR 76 | Resp 16 | Ht 72.0 in | Wt 204.4 lb

## 2019-02-05 DIAGNOSIS — I10 Essential (primary) hypertension: Secondary | ICD-10-CM

## 2019-02-05 DIAGNOSIS — M170 Bilateral primary osteoarthritis of knee: Secondary | ICD-10-CM | POA: Diagnosis not present

## 2019-02-05 MED ORDER — HYDROCHLOROTHIAZIDE 12.5 MG PO TABS: 6.2500 mg | ORAL_TABLET | Freq: Every day | ORAL | 5 refills | Status: DC

## 2019-02-05 NOTE — Progress Notes (Signed)
[facility] [place],  [address]  Internal MEDICINE  Office Visit Note  Patient Name: Terry Harvey  [id number]  [id number]  Date of Service: 02/18/2019  Chief Complaint  Patient presents with  . Medical Management of Chronic Issues    Hernia soreness, had hernia repair surgery recently  . Hypertension    pt checks blood pressure once daily and notice a difference    Added additional hctz 12.5mg  dose hctz at his last visit. Blood pressure much improved. No negative side effects from increased medication. Blood pressure improved since his last visit.       Current Medication: Outpatient Encounter Medications as of 02/05/2019  Medication Sig  . bisoprolol (ZEBETA) 5 MG tablet Take 1 tablet (5 mg total) by mouth daily.  . hydrochlorothiazide (HYDRODIURIL) 12.5 MG tablet Take 0.5 tablets (6.25 mg total) by mouth daily.  . metoprolol succinate (TOPROL-XL) 50 MG 24 hr tablet Take 1 tablet (50 mg total) by mouth daily.  . Misc Natural Products (OSTEO BI-FLEX JOINT SHIELD PO) Take 2 tablets by mouth daily.   . Multiple Vitamin (ONE-A-DAY MENS PO) Take 1 tablet by mouth daily.   . naproxen sodium (ANAPROX) 220 MG tablet Take 220 mg by mouth 2 (two) times daily.   . traMADol (ULTRAM) 50 MG tablet Take 1 tablet (50 mg total) by mouth 3 (three) times daily.  . [DISCONTINUED] hydrochlorothiazide (HYDRODIURIL) 12.5 MG tablet Take 0.5 tablets (6.25 mg total) by mouth daily.   No facility-administered encounter medications on file as of 02/05/2019.     Surgical History: Past Surgical History:  Procedure Laterality Date  . APPENDECTOMY  2009  . INGUINAL HERNIA REPAIR Right 11/12/2018   Procedure: HERNIA REPAIR INGUINAL ADULT;  Surgeon: Olean Ree, MD;  Location: [facility] ORS;  Service: General;  Laterality: Right;  . TUMOR REMOVAL  2002   from right side of neck    Medical History: Past Medical History:  Diagnosis Date  . Hyperlipidemia   .  Hypertension   . Insomnia   . Osteoarthritis     Family History: Family History  Problem Relation Age of Onset  . Diabetes Mellitus II Mother   . Hypertension Father     Social History   Socioeconomic History  . Marital status: Married    Spouse name: Not on file  . Number of children: 3  . Years of education: Not on file  . Highest education level: Not on file  Occupational History  . Not on file  Social Needs  . Financial resource strain: Not on file  . Food insecurity:    Worry: Not on file    Inability: Not on file  . Transportation needs:    Medical: Not on file    Non-medical: Not on file  Tobacco Use  . Smoking status: Former Smoker    Start date: 11/19/2011  . Smokeless tobacco: Never Used  Substance and Sexual Activity  . Alcohol use: No    Alcohol/week: 0.0 standard drinks  . Drug use: No  . Sexual activity: Not on file  Lifestyle  . Physical activity:    Days per week: Not on file    Minutes per session: Not on file  . Stress: Not on file  Relationships  . Social connections:    Talks on phone: Not on file    Gets together: Not on file    Attends religious service: Not on file    Active member of club or  organization: Not on file    Attends meetings of clubs or organizations: Not on file    Relationship status: Not on file  . Intimate partner violence:    Fear of current or ex partner: Not on file    Emotionally abused: Not on file    Physically abused: Not on file    Forced sexual activity: Not on file  Other Topics Concern  . Not on file  Social History Narrative  . Not on file      Review of Systems  Constitutional: Negative for chills, fatigue, fever and unexpected weight change.  HENT: Negative for congestion, postnasal drip, rhinorrhea, sinus pressure, sinus pain, sneezing and sore throat.   Respiratory: Negative for cough, chest tightness, shortness of breath and wheezing.   Cardiovascular: Negative for chest pain and palpitations.   Gastrointestinal: Positive for abdominal pain. Negative for constipation, diarrhea, nausea and vomiting.  Endocrine: Negative for cold intolerance, heat intolerance, polydipsia and polyuria.  Genitourinary: Negative for dysuria and frequency.  Musculoskeletal: Negative for arthralgias, back pain, joint swelling and neck pain.  Skin: Negative for rash.  Allergic/Immunologic: Negative for environmental allergies.  Neurological: Negative for tremors, numbness and headaches.  Hematological: Negative for adenopathy. Does not bruise/bleed easily.  Psychiatric/Behavioral: Negative for behavioral problems (Depression), sleep disturbance and suicidal ideas. The patient is not nervous/anxious.     Today's Vitals   02/05/19 1544  BP: (!) 143/86  Pulse: 76  Resp: 16  SpO2: 92%  Weight: 204 lb 6.4 oz (92.7 kg)  Height: 6' (1.829 m)   Body mass index is 27.72 kg/m.  Physical Exam Vitals signs and nursing note reviewed.  Constitutional:      General: He is not in acute distress.    Appearance: Normal appearance. He is well-developed. He is not ill-appearing or diaphoretic.  HENT:     Head: Normocephalic and atraumatic.     Right Ear: External ear normal. Tympanic membrane is not erythematous.     Left Ear: External ear normal. Tympanic membrane is not erythematous.     Nose: Nose normal. No rhinorrhea.     Right Sinus: No frontal sinus tenderness.     Left Sinus: No frontal sinus tenderness.     Mouth/Throat:     Pharynx: No oropharyngeal exudate or posterior oropharyngeal erythema.  Eyes:     Extraocular Movements: Extraocular movements intact.     Pupils: Pupils are equal, round, and reactive to light.  Neck:     Musculoskeletal: Normal range of motion and neck supple.     Thyroid: No thyromegaly.     Vascular: No carotid bruit or JVD.     Trachea: No tracheal deviation.  Cardiovascular:     Rate and Rhythm: Normal rate. Rhythm irregular.     Pulses: Normal pulses.     Heart  sounds: Normal heart sounds. No murmur. No friction rub. No gallop.      Comments: Mild tachycardia Pulmonary:     Effort: Pulmonary effort is normal. No respiratory distress.     Breath sounds: Normal breath sounds. No wheezing or rales.  Chest:     Chest wall: No tenderness.  Abdominal:     General: Abdomen is flat. Bowel sounds are normal.     Palpations: Abdomen is soft.  Musculoskeletal: Normal range of motion.  Lymphadenopathy:     Cervical: No cervical adenopathy.  Skin:    General: Skin is warm and dry.     Capillary Refill: Capillary refill takes less than  2 seconds.  Neurological:     General: No focal deficit present.     Mental Status: He is alert and oriented to person, place, and time.     Cranial Nerves: No cranial nerve deficit.  Psychiatric:        Mood and Affect: Mood normal.        Behavior: Behavior normal.        Thought Content: Thought content normal.        Judgment: Judgment normal.    Assessment/Plan: 1. Hypertension, unspecified type Continue HCTZ 12.5mg  tablets. Continue other blood pressure medication as prescribed  - hydrochlorothiazide (HYDRODIURIL) 12.5 MG tablet; Take 0.5 tablets (6.25 mg total) by mouth daily.  Dispense: 15 tablet; Refill: 5  2. Primary osteoarthritis of both knees Patient having worsening pain in both knees. Refer to orthopedics for further evaluation and treatment  - Ambulatory referral to Orthopedic Surgery  General Counseling: Terry Harvey verbalizes understanding of the findings of todays visit and agrees with plan of treatment. I have discussed any further diagnostic evaluation that may be needed or ordered today. We also reviewed his medications today. he has been encouraged to call the office with any questions or concerns that should arise related to todays visit.   Hypertension Counseling:   The following hypertensive lifestyle modification were recommended and discussed:  1. Limiting alcohol intake to less than 1 oz/day  of ethanol:(24 oz of beer or 8 oz of wine or 2 oz of 100-proof whiskey). 2. Take baby ASA 81 mg daily. 3. Importance of regular aerobic exercise and losing weight. 4. Reduce dietary saturated fat and cholesterol intake for overall cardiovascular health. 5. Maintaining adequate dietary potassium, calcium, and magnesium intake. 6. Regular monitoring of the blood pressure. 7. Reduce sodium intake to less than 100 mmol/day (less than 2.3 gm of sodium or less than 6 gm of sodium choride)   This patient was seen by [place] with Dr Lavera Guise as a part of collaborative care agreement  Orders Placed This Encounter  Procedures  . Ambulatory referral to Orthopedic Surgery    Meds ordered this encounter  Medications  . hydrochlorothiazide (HYDRODIURIL) 12.5 MG tablet    Sig: Take 0.5 tablets (6.25 mg total) by mouth daily.    Dispense:  15 tablet    Refill:  5    Please d/c metoprolol as this is not working.    Order Specific Question:   Supervising Provider    Answer:   Lavera Guise [[id number]]    Time spent: 66 Minutes      Dr Lavera Guise Internal medicine

## 2019-03-08 DIAGNOSIS — M170 Bilateral primary osteoarthritis of knee: Secondary | ICD-10-CM | POA: Diagnosis not present

## 2019-03-10 ENCOUNTER — Other Ambulatory Visit: Payer: Self-pay | Admitting: Nurse Practitioner

## 2019-03-10 DIAGNOSIS — I10 Essential (primary) hypertension: Secondary | ICD-10-CM

## 2019-03-10 MED ORDER — BISOPROLOL FUMARATE 5 MG PO TABS: 5.0000 mg | ORAL_TABLET | Freq: Every day | ORAL | 2 refills | Status: DC

## 2019-05-24 ENCOUNTER — Encounter: Payer: Self-pay | Admitting: Nurse Practitioner

## 2019-05-26 ENCOUNTER — Other Ambulatory Visit: Payer: Self-pay

## 2019-05-26 ENCOUNTER — Ambulatory Visit: Payer: Medicare Other | Admitting: Nurse Practitioner

## 2019-05-26 ENCOUNTER — Encounter: Payer: Self-pay | Admitting: Nurse Practitioner

## 2019-05-26 VITALS — BP 130/82 | HR 75 | Resp 16 | Ht 72.0 in | Wt 206.0 lb

## 2019-05-26 DIAGNOSIS — M170 Bilateral primary osteoarthritis of knee: Secondary | ICD-10-CM

## 2019-05-26 DIAGNOSIS — I10 Essential (primary) hypertension: Secondary | ICD-10-CM

## 2019-05-26 MED ORDER — TRAMADOL HCL 50 MG PO TABS: 50.0000 mg | ORAL_TABLET | Freq: Three times a day (TID) | ORAL | 3 refills | Status: DC

## 2019-05-26 NOTE — Progress Notes (Signed)
[facility] [place], [place] [address]  Internal MEDICINE  Office Visit Note  Patient Name: Terry Harvey  [id number]  [id number]  Date of Service: 05/30/2019  Chief Complaint  Patient presents with  . Medication Refill    tramadol  . Arthritis    The patient is here for routine follow up. He has bilateral knee pain and significant osteoarthritis in both knees. He has seen orthopedics. Last visit was 02/2019. He was given synvisc injections. Helped for a little while. He also takes tramadol for this pain. This is prescribed by our office. Orthopedics will not prescribe this since it is already prescribed by this office. His pain is about 7/10 in severity. When he takes tramadol, the pain comes to 3 or 4/10 in severity. It allows him to stay active and participate in normal daily activities. He needs to have a new prescription for this today.       Current Medication: Outpatient Encounter Medications as of 05/26/2019  Medication Sig  . bisoprolol (ZEBETA) 5 MG tablet Take 1 tablet (5 mg total) by mouth daily.  . hydrochlorothiazide (HYDRODIURIL) 12.5 MG tablet Take 0.5 tablets (6.25 mg total) by mouth daily.  . Misc Natural Products (OSTEO BI-FLEX JOINT SHIELD PO) Take 2 tablets by mouth daily.   . Multiple Vitamin (ONE-A-DAY MENS PO) Take 1 tablet by mouth daily.   . naproxen sodium (ANAPROX) 220 MG tablet Take 220 mg by mouth 2 (two) times daily.   . traMADol (ULTRAM) 50 MG tablet Take 1 tablet (50 mg total) by mouth 3 (three) times daily.  . [DISCONTINUED] traMADol (ULTRAM) 50 MG tablet Take 1 tablet (50 mg total) by mouth 3 (three) times daily.  . metoprolol succinate (TOPROL-XL) 50 MG 24 hr tablet Take 1 tablet (50 mg total) by mouth daily.   No facility-administered encounter medications on file as of 05/26/2019.     Surgical History: Past Surgical History:  Procedure Laterality Date  . APPENDECTOMY  2009  . INGUINAL HERNIA REPAIR Right  11/12/2018   Procedure: HERNIA REPAIR INGUINAL ADULT;  Surgeon: Olean Ree, MD;  Location: [facility] ORS;  Service: General;  Laterality: Right;  . TUMOR REMOVAL  2002   from right side of neck    Medical History: Past Medical History:  Diagnosis Date  . Hyperlipidemia   . Hypertension   . Insomnia   . Osteoarthritis     Family History: Family History  Problem Relation Age of Onset  . Diabetes Mellitus II Mother   . Hypertension Father     Social History   Socioeconomic History  . Marital status: Married    Spouse name: Not on file  . Number of children: 3  . Years of education: Not on file  . Highest education level: Not on file  Occupational History  . Not on file  Social Needs  . Financial resource strain: Not on file  . Food insecurity    Worry: Not on file    Inability: Not on file  . Transportation needs    Medical: Not on file    Non-medical: Not on file  Tobacco Use  . Smoking status: Former Smoker    Start date: 11/19/2011  . Smokeless tobacco: Never Used  Substance and Sexual Activity  . Alcohol use: No    Alcohol/week: 0.0 standard drinks  . Drug use: No  . Sexual activity: Not on file  Lifestyle  . Physical activity    Days per week:  Not on file    Minutes per session: Not on file  . Stress: Not on file  Relationships  . Social Herbalist on phone: Not on file    Gets together: Not on file    Attends religious service: Not on file    Active member of club or organization: Not on file    Attends meetings of clubs or organizations: Not on file    Relationship status: Not on file  . Intimate partner violence    Fear of current or ex partner: Not on file    Emotionally abused: Not on file    Physically abused: Not on file    Forced sexual activity: Not on file  Other Topics Concern  . Not on file  Social History Narrative  . Not on file      Review of Systems  Constitutional: Negative for chills, fatigue, fever and unexpected  weight change.  HENT: Negative for congestion, postnasal drip, rhinorrhea, sinus pressure, sinus pain, sneezing and sore throat.   Respiratory: Negative for cough, chest tightness, shortness of breath and wheezing.   Cardiovascular: Negative for chest pain and palpitations.  Gastrointestinal: Negative for abdominal pain, constipation, diarrhea, nausea and vomiting.  Endocrine: Negative for cold intolerance, heat intolerance, polydipsia and polyuria.  Musculoskeletal: Negative for arthralgias, back pain, joint swelling and neck pain.       Bilateral knee pain.  Skin: Negative for rash.  Allergic/Immunologic: Negative for environmental allergies.  Neurological: Negative for tremors, numbness and headaches.  Hematological: Negative for adenopathy. Does not bruise/bleed easily.  Psychiatric/Behavioral: Negative for behavioral problems (Depression), sleep disturbance and suicidal ideas. The patient is not nervous/anxious.     Today's Vitals   05/26/19 1039  BP: 130/82  Pulse: 75  Resp: 16  SpO2: 95%  Weight: 206 lb (93.4 kg)  Height: 6' (1.829 m)   Body mass index is 27.94 kg/m. Physical Exam Vitals signs and nursing note reviewed.  Constitutional:      General: He is not in acute distress.    Appearance: Normal appearance. He is well-developed. He is not diaphoretic.  HENT:     Head: Normocephalic and atraumatic.     Mouth/Throat:     Pharynx: No oropharyngeal exudate.  Eyes:     Pupils: Pupils are equal, round, and reactive to light.  Neck:     Musculoskeletal: Normal range of motion and neck supple.     Thyroid: No thyromegaly.     Vascular: No JVD.     Trachea: No tracheal deviation.  Cardiovascular:     Rate and Rhythm: Normal rate and regular rhythm.     Heart sounds: Normal heart sounds. No murmur. No friction rub. No gallop.   Pulmonary:     Effort: Pulmonary effort is normal. No respiratory distress.     Breath sounds: No wheezing or rales.  Chest:     Chest wall:  No tenderness.  Abdominal:     General: Bowel sounds are normal.     Palpations: Abdomen is soft.  Musculoskeletal: Normal range of motion.     Comments: Bilateral knee pain. Mild swelling present along medial and lateral aspects of the knee. Moderate crepitus can be felt with flexion and extension of the knees   Lymphadenopathy:     Cervical: No cervical adenopathy.  Skin:    General: Skin is warm and dry.  Neurological:     Mental Status: He is alert and oriented to person, place, and time.  Cranial Nerves: No cranial nerve deficit.  Psychiatric:        Behavior: Behavior normal.        Thought Content: Thought content normal.        Judgment: Judgment normal.   Assessment/Plan: 1. Hypertension, unspecified type Stable. Continue bp medication as prescribed   2. Primary osteoarthritis of both knees May take tramadol 50mg  up to three times daily if needed for pain. A new prescription was sent to his pharmacy today.  - traMADol (ULTRAM) 50 MG tablet; Take 1 tablet (50 mg total) by mouth 3 (three) times daily.  Dispense: 90 tablet; Refill: 3  General Counseling: Taji verbalizes understanding of the findings of todays visit and agrees with plan of treatment. I have discussed any further diagnostic evaluation that may be needed or ordered today. We also reviewed his medications today. he has been encouraged to call the office with any questions or concerns that should arise related to todays visit.  This patient was seen by [place] with Dr Lavera Guise as a part of collaborative care agreement  Meds ordered this encounter  Medications  . traMADol (ULTRAM) 50 MG tablet    Sig: Take 1 tablet (50 mg total) by mouth 3 (three) times daily.    Dispense:  90 tablet    Refill:  3    Order Specific Question:   Supervising Provider    Answer:   Lavera Guise [[id number]]    Time spent: 36 Minutes      Dr Lavera Guise Internal medicine

## 2019-08-13 ENCOUNTER — Other Ambulatory Visit: Payer: Self-pay

## 2019-08-13 ENCOUNTER — Ambulatory Visit (INDEPENDENT_AMBULATORY_CARE_PROVIDER_SITE_OTHER): Payer: Medicare Other | Admitting: Nurse Practitioner

## 2019-08-13 ENCOUNTER — Encounter: Payer: Self-pay | Admitting: Nurse Practitioner

## 2019-08-13 VITALS — BP 146/86 | HR 63 | Temp 97.8°F | Resp 16 | Ht 72.0 in | Wt 204.0 lb

## 2019-08-13 DIAGNOSIS — M170 Bilateral primary osteoarthritis of knee: Secondary | ICD-10-CM | POA: Diagnosis not present

## 2019-08-13 DIAGNOSIS — I10 Essential (primary) hypertension: Secondary | ICD-10-CM | POA: Diagnosis not present

## 2019-08-13 MED ORDER — TRAMADOL HCL 50 MG PO TABS: 50.0000 mg | ORAL_TABLET | Freq: Three times a day (TID) | ORAL | 3 refills | Status: DC

## 2019-08-13 MED ORDER — HYDROCHLOROTHIAZIDE 12.5 MG PO TABS: 6.2500 mg | ORAL_TABLET | Freq: Every day | ORAL | 5 refills | Status: DC

## 2019-08-13 NOTE — Progress Notes (Signed)
[facility] [place], [place] [address]  Internal MEDICINE  Office Visit Note  Patient Name: Terry Harvey  [id number]  [id number]  Date of Service: 08/13/2019  Chief Complaint  Patient presents with  . Hypertension  . Hyperlipidemia  . Arthritis  . Quality Metric Gaps    pna vacc    The patient is here for routine follow up visit. His blood pressure is elevated today. States that he takes all of his medications as prescribed. Checks his blood pressure at home and usually running [id number] and [id number] systolic and [id number] diastolic. He states that he feels well. Has no concerns or complaints today. He has bilateral knee pain and significant osteoarthritis in both knees. He has seen orthopedics. Last visit was 02/2019. He was given synvisc injections. Helped for a little while. He also takes tramadol for this pain. This is prescribed by our office. Orthopedics will not prescribe this since it is already prescribed by this office. His pain is about 7/10 in severity. When he takes tramadol, the pain comes to 3 or 4/10 in severity. It allows him to stay active and participate in normal daily activities. He needs to have a new prescription for this today.       Current Medication: Outpatient Encounter Medications as of 08/13/2019  Medication Sig  . bisoprolol (ZEBETA) 5 MG tablet Take 1 tablet (5 mg total) by mouth daily.  . hydrochlorothiazide (HYDRODIURIL) 12.5 MG tablet Take 0.5 tablets (6.25 mg total) by mouth daily.  . Misc Natural Products (OSTEO BI-FLEX JOINT SHIELD PO) Take 2 tablets by mouth daily.   . Multiple Vitamin (ONE-A-DAY MENS PO) Take 1 tablet by mouth daily.   . naproxen sodium (ANAPROX) 220 MG tablet Take 220 mg by mouth 2 (two) times daily.   . traMADol (ULTRAM) 50 MG tablet Take 1 tablet (50 mg total) by mouth 3 (three) times daily.  . [DISCONTINUED] hydrochlorothiazide (HYDRODIURIL) 12.5 MG tablet Take 0.5 tablets (6.25 mg total) by mouth daily.   . [DISCONTINUED] traMADol (ULTRAM) 50 MG tablet Take 1 tablet (50 mg total) by mouth 3 (three) times daily.  . [DISCONTINUED] bisoprolol (ZEBETA) 5 MG tablet Take 1 tablet (5 mg total) by mouth daily.  . [DISCONTINUED] metoprolol succinate (TOPROL-XL) 50 MG 24 hr tablet Take 1 tablet (50 mg total) by mouth daily.  . [DISCONTINUED] traMADol (ULTRAM) 50 MG tablet Take 1 tablet (50 mg total) by mouth 3 (three) times daily.   No facility-administered encounter medications on file as of 08/13/2019.     Surgical History: Past Surgical History:  Procedure Laterality Date  . APPENDECTOMY  2009  . INGUINAL HERNIA REPAIR Right 11/12/2018   Procedure: HERNIA REPAIR INGUINAL ADULT;  Surgeon: Olean Ree, MD;  Location: [facility] ORS;  Service: General;  Laterality: Right;  . TUMOR REMOVAL  2002   from right side of neck    Medical History: Past Medical History:  Diagnosis Date  . Hyperlipidemia   . Hypertension   . Insomnia   . Osteoarthritis     Family History: Family History  Problem Relation Age of Onset  . Diabetes Mellitus II Mother   . Hypertension Father     Social History   Socioeconomic History  . Marital status: Married    Spouse name: Not on file  . Number of children: 3  . Years of education: Not on file  . Highest education level: Not on file  Occupational History  . Not on file  Social Needs  . Financial resource strain: Not on file  . Food insecurity    Worry: Not on file    Inability: Not on file  . Transportation needs    Medical: Not on file    Non-medical: Not on file  Tobacco Use  . Smoking status: Former Smoker    Start date: 11/19/2011  . Smokeless tobacco: Never Used  Substance and Sexual Activity  . Alcohol use: No    Alcohol/week: 0.0 standard drinks  . Drug use: No  . Sexual activity: Not on file  Lifestyle  . Physical activity    Days per week: Not on file    Minutes per session: Not on file  . Stress: Not on file  Relationships  . Social  Herbalist on phone: Not on file    Gets together: Not on file    Attends religious service: Not on file    Active member of club or organization: Not on file    Attends meetings of clubs or organizations: Not on file    Relationship status: Not on file  . Intimate partner violence    Fear of current or ex partner: Not on file    Emotionally abused: Not on file    Physically abused: Not on file    Forced sexual activity: Not on file  Other Topics Concern  . Not on file  Social History Narrative  . Not on file      Review of Systems  Constitutional: Negative for chills, fatigue, fever and unexpected weight change.  HENT: Negative for congestion, postnasal drip, rhinorrhea, sinus pressure, sinus pain, sneezing and sore throat.   Respiratory: Negative for cough, chest tightness, shortness of breath and wheezing.   Cardiovascular: Negative for chest pain and palpitations.  Gastrointestinal: Negative for abdominal pain, constipation, diarrhea, nausea and vomiting.  Endocrine: Negative for cold intolerance, heat intolerance, polydipsia and polyuria.  Musculoskeletal: Negative for arthralgias, back pain, joint swelling and neck pain.       Bilateral knee pain.  Skin: Negative for rash.  Allergic/Immunologic: Negative for environmental allergies.  Neurological: Negative for dizziness, tremors, numbness and headaches.  Hematological: Negative for adenopathy. Does not bruise/bleed easily.  Psychiatric/Behavioral: Negative for behavioral problems (Depression), sleep disturbance and suicidal ideas. The patient is not nervous/anxious.     Today's Vitals   08/13/19 1042 08/13/19 1126  BP: (!) 153/100 (!) 146/86  Pulse: 63   Resp: 16   Temp: 97.8 F (36.6 C)   SpO2: 94%   Weight: 204 lb (92.5 kg)   Height: 6' (1.829 m)    Body mass index is 27.67 kg/m.  Physical Exam Vitals signs and nursing note reviewed.  Constitutional:      General: He is not in acute distress.     Appearance: Normal appearance. He is well-developed. He is not diaphoretic.  HENT:     Head: Normocephalic and atraumatic.     Mouth/Throat:     Pharynx: No oropharyngeal exudate.  Eyes:     Pupils: Pupils are equal, round, and reactive to light.  Neck:     Musculoskeletal: Normal range of motion and neck supple.     Thyroid: No thyromegaly.     Vascular: No carotid bruit or JVD.     Trachea: No tracheal deviation.  Cardiovascular:     Rate and Rhythm: Normal rate and regular rhythm.     Heart sounds: Normal heart sounds. No murmur. No friction rub. No gallop.  Pulmonary:     Effort: Pulmonary effort is normal. No respiratory distress.     Breath sounds: Normal breath sounds. No wheezing or rales.  Chest:     Chest wall: No tenderness.  Abdominal:     Palpations: Abdomen is soft.  Musculoskeletal: Normal range of motion.     Comments: Bilateral knee pain. Mild swelling present along medial and lateral aspects of the knee. Moderate crepitus can be felt with flexion and extension of the knees   Lymphadenopathy:     Cervical: No cervical adenopathy.  Skin:    General: Skin is warm and dry.  Neurological:     Mental Status: He is alert and oriented to person, place, and time.     Cranial Nerves: No cranial nerve deficit.  Psychiatric:        Behavior: Behavior normal.        Thought Content: Thought content normal.        Judgment: Judgment normal.   Assessment/Plan:  1. Hypertension, unspecified type Stable. Continue bp medication as prescribed. Monitor blood pressures closely at home.  - hydrochlorothiazide (HYDRODIURIL) 12.5 MG tablet; Take 0.5 tablets (6.25 mg total) by mouth daily.  Dispense: 15 tablet; Refill: 5  2. Primary osteoarthritis of both knees May continue to take tramadol up to three times daily as needed for pain. A new, written prescription was provided.  - traMADol (ULTRAM) 50 MG tablet; Take 1 tablet (50 mg total) by mouth 3 (three) times daily.  Dispense:  90 tablet; Refill: 3  General Counseling: Kayce verbalizes understanding of the findings of todays visit and agrees with plan of treatment. I have discussed any further diagnostic evaluation that may be needed or ordered today. We also reviewed his medications today. he has been encouraged to call the office with any questions or concerns that should arise related to todays visit.  Hypertension Counseling:   The following hypertensive lifestyle modification were recommended and discussed:  1. Limiting alcohol intake to less than 1 oz/day of ethanol:(24 oz of beer or 8 oz of wine or 2 oz of 100-proof whiskey). 2. Take baby ASA 81 mg daily. 3. Importance of regular aerobic exercise and losing weight. 4. Reduce dietary saturated fat and cholesterol intake for overall cardiovascular health. 5. Maintaining adequate dietary potassium, calcium, and magnesium intake. 6. Regular monitoring of the blood pressure. 7. Reduce sodium intake to less than 100 mmol/day (less than 2.3 gm of sodium or less than 6 gm of sodium choride)   This patient was seen by [place] with Dr Lavera Guise as a part of collaborative care agreement   Meds ordered this encounter  Medications  . traMADol (ULTRAM) 50 MG tablet    Sig: Take 1 tablet (50 mg total) by mouth 3 (three) times daily.    Dispense:  90 tablet    Refill:  3    Patient provided written prescription today.    Order Specific Question:   Supervising Provider    Answer:   Lavera Guise [id number]  . hydrochlorothiazide (HYDRODIURIL) 12.5 MG tablet    Sig: Take 0.5 tablets (6.25 mg total) by mouth daily.    Dispense:  15 tablet    Refill:  5    Please d/c metoprolol as this is not working.    Order Specific Question:   Supervising Provider    Answer:   Lavera Guise [id number]    Time spent: 89 Minutes      Dr Latricia Heft  [facility]

## 2019-11-04 ENCOUNTER — Telehealth: Payer: Self-pay

## 2019-11-04 NOTE — Telephone Encounter (Signed)
CONFIRMED AND SCREENED FOR 11-08-19 OV. 

## 2019-11-08 ENCOUNTER — Other Ambulatory Visit: Payer: Self-pay

## 2019-11-08 ENCOUNTER — Ambulatory Visit (INDEPENDENT_AMBULATORY_CARE_PROVIDER_SITE_OTHER): Payer: Medicare Other | Admitting: Nurse Practitioner

## 2019-11-08 ENCOUNTER — Encounter: Payer: Self-pay | Admitting: Nurse Practitioner

## 2019-11-08 VITALS — BP 151/97 | HR 88 | Resp 16 | Ht 72.0 in | Wt 206.0 lb

## 2019-11-08 DIAGNOSIS — E782 Mixed hyperlipidemia: Secondary | ICD-10-CM | POA: Diagnosis not present

## 2019-11-08 DIAGNOSIS — R300 Dysuria: Secondary | ICD-10-CM

## 2019-11-08 DIAGNOSIS — I10 Essential (primary) hypertension: Secondary | ICD-10-CM

## 2019-11-08 DIAGNOSIS — Z1159 Encounter for screening for other viral diseases: Secondary | ICD-10-CM

## 2019-11-08 DIAGNOSIS — Z0001 Encounter for general adult medical examination with abnormal findings: Secondary | ICD-10-CM

## 2019-11-08 DIAGNOSIS — Z23 Encounter for immunization: Secondary | ICD-10-CM

## 2019-11-08 DIAGNOSIS — M170 Bilateral primary osteoarthritis of knee: Secondary | ICD-10-CM

## 2019-11-08 MED ORDER — PNEUMOVAX 23 25 MCG/0.5ML IJ INJ: INJECTION | INTRAMUSCULAR | 0 refills | Status: DC

## 2019-11-08 MED ORDER — TRAMADOL HCL 50 MG PO TABS: 50.0000 mg | ORAL_TABLET | Freq: Three times a day (TID) | ORAL | 3 refills | Status: DC

## 2019-11-08 NOTE — Progress Notes (Addendum)
[facility] [place], [place] [address]  Internal MEDICINE  Office Visit Note  Patient Name: Terry Harvey  [id number]  [id number]  Date of Service: 11/08/2019   Pt is here for routine health maintenance examination  Chief Complaint  Patient presents with  . Medical Management of Chronic Issues  . Hyperlipidemia  . Hypertension  . Annual Exam    awv     The patient is here for health maintenance exam. His blood pressure is slightly elevated today. He states that it generally goes up about ten points when he is in the office. He does, regularly, check his blood pressure at home. Generally runs about 130/80. He has no symptoms such as chest pain, chest pressure, or headache. His cholesterol panel was moderately elevated at his last check. He will be due for new set of labs prior to his next visit. He states that he does need new prescription for his tramadol. Takes this as needed for bilateral knee pain. Only takes as needed .    Current Medication: Outpatient Encounter Medications as of 11/08/2019  Medication Sig  . bisoprolol (ZEBETA) 5 MG tablet Take 1 tablet (5 mg total) by mouth daily.  . hydrochlorothiazide (HYDRODIURIL) 12.5 MG tablet Take 0.5 tablets (6.25 mg total) by mouth daily.  . Misc Natural Products (OSTEO BI-FLEX JOINT SHIELD PO) Take 2 tablets by mouth daily.   . Multiple Vitamin (ONE-A-DAY MENS PO) Take 1 tablet by mouth daily.   . naproxen sodium (ANAPROX) 220 MG tablet Take 220 mg by mouth 2 (two) times daily.   . traMADol (ULTRAM) 50 MG tablet Take 1 tablet (50 mg total) by mouth 3 (three) times daily.  . [DISCONTINUED] traMADol (ULTRAM) 50 MG tablet Take 1 tablet (50 mg total) by mouth 3 (three) times daily.  . pneumococcal 23 valent vaccine (PNEUMOVAX 23) 25 MCG/0.5ML injection Inject 0.47ml IM once   No facility-administered encounter medications on file as of 11/08/2019.    Surgical History: Past Surgical History:   Procedure Laterality Date  . APPENDECTOMY  2009  . INGUINAL HERNIA REPAIR Right 11/12/2018   Procedure: HERNIA REPAIR INGUINAL ADULT;  Surgeon: Olean Ree, MD;  Location: [facility] ORS;  Service: General;  Laterality: Right;  . TUMOR REMOVAL  2002   from right side of neck    Medical History: Past Medical History:  Diagnosis Date  . Hyperlipidemia   . Hypertension   . Insomnia   . Osteoarthritis     Family History: Family History  Problem Relation Age of Onset  . Diabetes Mellitus II Mother   . Hypertension Father       Review of Systems  Constitutional: Negative for activity change, chills, fatigue, fever and unexpected weight change.  HENT: Negative for congestion, postnasal drip, rhinorrhea, sinus pressure, sinus pain, sneezing and sore throat.   Respiratory: Negative for cough, chest tightness, shortness of breath and wheezing.   Cardiovascular: Negative for chest pain and palpitations.       Elevated blood pressure in the office.   Gastrointestinal: Negative for abdominal pain, constipation, diarrhea, nausea and vomiting.  Endocrine: Negative for cold intolerance, heat intolerance, polydipsia and polyuria.  Musculoskeletal: Negative for arthralgias, back pain, joint swelling and neck pain.       Bilateral knee pain.  Skin: Negative for rash.  Allergic/Immunologic: Negative for environmental allergies.  Neurological: Negative for dizziness, tremors, numbness and headaches.  Hematological: Negative for adenopathy. Does not bruise/bleed easily.  Psychiatric/Behavioral: Negative for behavioral  problems (Depression), sleep disturbance and suicidal ideas. The patient is not nervous/anxious.      Today's Vitals   11/08/19 1509  BP: (!) 151/97  Pulse: 88  Resp: 16  SpO2: 92%  Weight: 206 lb (93.4 kg)  Height: 6' (1.829 m)   Body mass index is 27.94 kg/m.  Physical Exam Vitals and nursing note reviewed.  Constitutional:      General: He is not in acute  distress.    Appearance: Normal appearance. He is well-developed. He is not diaphoretic.  HENT:     Head: Normocephalic and atraumatic.     Mouth/Throat:     Pharynx: No oropharyngeal exudate.  Eyes:     Pupils: Pupils are equal, round, and reactive to light.  Neck:     Thyroid: No thyromegaly.     Vascular: No carotid bruit or JVD.     Trachea: No tracheal deviation.  Cardiovascular:     Rate and Rhythm: Normal rate and regular rhythm.     Pulses: Normal pulses.     Heart sounds: Normal heart sounds. No murmur. No friction rub. No gallop.   Pulmonary:     Effort: Pulmonary effort is normal. No respiratory distress.     Breath sounds: Normal breath sounds. No wheezing or rales.  Chest:     Chest wall: No tenderness.  Abdominal:     General: Bowel sounds are normal.     Palpations: Abdomen is soft.     Tenderness: There is no abdominal tenderness.  Musculoskeletal:        General: Normal range of motion.     Cervical back: Normal range of motion and neck supple.     Comments: Bilateral knee pain. Mild swelling present along medial and lateral aspects of the knee. Moderate crepitus can be felt with flexion and extension of the knees   Lymphadenopathy:     Cervical: No cervical adenopathy.  Skin:    General: Skin is warm and dry.  Neurological:     Mental Status: He is alert and oriented to person, place, and time.     Cranial Nerves: No cranial nerve deficit.  Psychiatric:        Behavior: Behavior normal.        Thought Content: Thought content normal.        Judgment: Judgment normal.    Depression screen [facility] 2/9 11/08/2019 08/13/2019 05/26/2019 02/05/2019 11/05/2018  Decreased Interest 0 0 0 0 0  Down, Depressed, Hopeless 0 0 0 0 0  PHQ - 2 Score 0 0 0 0 0    Functional Status Survey: Is the patient deaf or have difficulty hearing?: No Does the patient have difficulty seeing, even when wearing glasses/contacts?: No Does the patient have difficulty concentrating,  remembering, or making decisions?: No Does the patient have difficulty walking or climbing stairs?: No Does the patient have difficulty dressing or bathing?: No Does the patient have difficulty doing errands alone such as visiting a doctor's office or shopping?: No  MMSE - [place] Exam 11/08/2019  Orientation to time 5  Orientation to Place 5  Registration 3  Attention/ Calculation 5  Recall 3  Language- name 2 objects 2  Language- repeat 1  Language- follow 3 step command 3  Language- read & follow direction 1  Write a sentence 1  Copy design 1  Total score 30    Fall Risk  11/08/2019 08/13/2019 05/26/2019 02/05/2019 11/27/2018  Falls in the past year? 0 0 0  0 0  Number falls in past yr: 0 - - - 0  Injury with Fall? 0 - - - 0    Assessment/Plan: 1. Encounter for routine adult health examination with abnormal findings Annual health maintenance exam today.  2. Hypertension, unspecified type Stable at home. Monitored regularly. Continue meds as prescribed.   3. Mixed hyperlipidemia Recheck fasting lipid panel. Treat as indicated   4. Primary osteoarthritis of both knees May take tramadol 50mg  up to three times daily if needed for pain. New prescription sent to his pharmacy today.  - traMADol (ULTRAM) 50 MG tablet; Take 1 tablet (50 mg total) by mouth 3 (three) times daily.  Dispense: 90 tablet; Refill: 3  5. Encounter for hepatitis C screening test for low risk patient Check hepatitis C when checking routine, fasting labs.   6. Need for vaccination against Streptococcus pneumoniae using pneumococcal conjugate vaccine 7 Prescription for pneumovax sent to his pharmacy for administration.  - pneumococcal 23 valent vaccine (PNEUMOVAX 23) 25 MCG/0.5ML injection; Inject 0.25ml IM once  Dispense: 0.5 mL; Refill: 0  7. Dysuria - UA/M w/rflx Culture, Routine  General Counseling: [place] verbalizes understanding of the findings of todays visit and agrees with plan of  treatment. I have discussed any further diagnostic evaluation that may be needed or ordered today. We also reviewed his medications today. he has been encouraged to call the office with any questions or concerns that should arise related to todays visit.    Counseling:  Hypertension Counseling:   The following hypertensive lifestyle modification were recommended and discussed:  1. Limiting alcohol intake to less than 1 oz/day of ethanol:(24 oz of beer or 8 oz of wine or 2 oz of 100-proof whiskey). 2. Take baby ASA 81 mg daily. 3. Importance of regular aerobic exercise and losing weight. 4. Reduce dietary saturated fat and cholesterol intake for overall cardiovascular health. 5. Maintaining adequate dietary potassium, calcium, and magnesium intake. 6. Regular monitoring of the blood pressure. 7. Reduce sodium intake to less than 100 mmol/day (less than 2.3 gm of sodium or less than 6 gm of sodium choride)   This patient was seen by [place] with Dr Lavera Guise as a part of collaborative care agreement  Orders Placed This Encounter  Procedures  . UA/M w/rflx Culture, Routine    Meds ordered this encounter  Medications  . pneumococcal 23 valent vaccine (PNEUMOVAX 23) 25 MCG/0.5ML injection    Sig: Inject 0.69ml IM once    Dispense:  0.5 mL    Refill:  0    Order Specific Question:   Supervising Provider    Answer:   Lavera Guise [id number]  . traMADol (ULTRAM) 50 MG tablet    Sig: Take 1 tablet (50 mg total) by mouth 3 (three) times daily.    Dispense:  90 tablet    Refill:  3    Order Specific Question:   Supervising Provider    Answer:   Lavera Guise [id number]    Time spent: [place], MD  Internal Medicine

## 2019-11-09 LAB — UA/M W/RFLX CULTURE, ROUTINE
Bilirubin, UA: NEGATIVE
Glucose, UA: NEGATIVE
Ketones, UA: NEGATIVE
Leukocytes,UA: NEGATIVE
Nitrite, UA: NEGATIVE
Protein,UA: NEGATIVE
RBC, UA: NEGATIVE
Specific Gravity, UA: 1.012 (ref 1.005–1.030)
Urobilinogen, Ur: 0.2 mg/dL (ref 0.2–1.0)
pH, UA: 7 (ref 5.0–7.5)

## 2019-11-09 LAB — MICROSCOPIC EXAMINATION
Casts: NONE SEEN /lpf
Epithelial Cells (non renal): NONE SEEN /hpf (ref 0–10)

## 2019-11-15 ENCOUNTER — Ambulatory Visit: Payer: Medicare Other | Admitting: Nurse Practitioner

## 2019-12-07 ENCOUNTER — Other Ambulatory Visit: Payer: Self-pay

## 2019-12-07 DIAGNOSIS — I10 Essential (primary) hypertension: Secondary | ICD-10-CM

## 2019-12-07 MED ORDER — BISOPROLOL FUMARATE 5 MG PO TABS: 5.0000 mg | ORAL_TABLET | Freq: Every day | ORAL | 1 refills | Status: DC

## 2020-01-10 ENCOUNTER — Ambulatory Visit: Payer: Medicare Other | Attending: Internal Medicine

## 2020-01-10 ENCOUNTER — Other Ambulatory Visit: Payer: Self-pay

## 2020-01-10 DIAGNOSIS — Z23 Encounter for immunization: Secondary | ICD-10-CM | POA: Insufficient documentation

## 2020-01-10 NOTE — Progress Notes (Signed)
   [id number]  Name:  Terry Harvey.    MRN: [id number] DOB: 13-Mar-1951  01/10/2020  Mr. [name] was observed post Covid-19 immunization for 15 minutes without incidence. He was provided with Vaccine Information Sheet and instruction to access the V-Safe system.   Mr. [name] was instructed to call 911 with any severe reactions post vaccine: Marland Kitchen Difficulty breathing  . Swelling of your face and throat  . A fast heartbeat  . A bad rash all over your body  . Dizziness and weakness    Immunizations Administered    Name Date Dose VIS Date Route   Pfizer COVID-19 Vaccine 01/10/2020  9:55 AM 0.3 mL 10/29/2019 Intramuscular   Manufacturer: [place]   Lot: [id number]   [place]: [id number]

## 2020-01-18 ENCOUNTER — Encounter: Payer: Self-pay | Admitting: Nurse Practitioner

## 2020-01-19 NOTE — Telephone Encounter (Signed)
This is important to give every 2 months app

## 2020-01-19 NOTE — Telephone Encounter (Signed)
Can you ask his pharmacy about this? The prescription I wrote him should have lasted until 02/2020. I did reply to him saying I may need to see him back sooner based on [place] law, but I am not 100% sure thre requirements.

## 2020-02-02 ENCOUNTER — Ambulatory Visit: Payer: Medicare Other | Attending: Internal Medicine

## 2020-02-02 DIAGNOSIS — Z23 Encounter for immunization: Secondary | ICD-10-CM

## 2020-02-02 NOTE — Progress Notes (Signed)
   [id number]  Name:  Terry Harvey.    MRN: [id number] DOB: Aug 04, 1951  02/02/2020  [name] was observed post Covid-19 immunization for 15 minutes without incident. He was provided with Vaccine Information Sheet and instruction to access the V-Safe system.   [name] was instructed to call 911 with any severe reactions post vaccine: Marland Kitchen Difficulty breathing  . Swelling of face and throat  . A fast heartbeat  . A bad rash all over body  . Dizziness and weakness   Immunizations Administered    Name Date Dose VIS Date Route   Pfizer COVID-19 Vaccine 02/02/2020 11:36 AM 0.3 mL 10/29/2019 Intramuscular   Manufacturer: [place]   Lot: [id number]   [place]: [id number]

## 2020-02-14 ENCOUNTER — Other Ambulatory Visit: Payer: Self-pay

## 2020-02-14 DIAGNOSIS — I10 Essential (primary) hypertension: Secondary | ICD-10-CM

## 2020-02-14 MED ORDER — HYDROCHLOROTHIAZIDE 12.5 MG PO TABS: 6.2500 mg | ORAL_TABLET | Freq: Every day | ORAL | 3 refills | Status: DC

## 2020-03-06 ENCOUNTER — Other Ambulatory Visit: Payer: Self-pay | Admitting: Nurse Practitioner

## 2020-03-07 ENCOUNTER — Telehealth: Payer: Self-pay

## 2020-03-07 LAB — CBC
Hematocrit: 43 % (ref 37.5–51.0)
Hemoglobin: 15.1 g/dL (ref 13.0–17.7)
MCH: 31.6 pg (ref 26.6–33.0)
MCHC: 35.1 g/dL (ref 31.5–35.7)
MCV: 90 fL (ref 79–97)
Platelets: 253 10*3/uL (ref 150–450)
RBC: 4.78 x10E6/uL (ref 4.14–5.80)
RDW: 12.6 % (ref 11.6–15.4)
WBC: 7.2 10*3/uL (ref 3.4–10.8)

## 2020-03-07 LAB — COMPREHENSIVE METABOLIC PANEL
ALT: 37 IU/L (ref 0–44)
AST: 28 IU/L (ref 0–40)
Albumin/Globulin Ratio: 1.5 (ref 1.2–2.2)
Albumin: 4.4 g/dL (ref 3.8–4.8)
Alkaline Phosphatase: 56 IU/L (ref 39–117)
BUN/Creatinine Ratio: 11 (ref 10–24)
BUN: 13 mg/dL (ref 8–27)
Bilirubin Total: 0.5 mg/dL (ref 0.0–1.2)
CO2: 24 mmol/L (ref 20–29)
Calcium: 9.7 mg/dL (ref 8.6–10.2)
Chloride: 101 mmol/L (ref 96–106)
Creatinine, Ser: 1.2 mg/dL (ref 0.76–1.27)
GFR calc Af Amer: 71 mL/min/{1.73_m2} (ref >59)
GFR calc non Af Amer: 62 mL/min/{1.73_m2} (ref >59)
Globulin, Total: 3 g/dL (ref 1.5–4.5)
Glucose: 103 mg/dL — ABNORMAL HIGH (ref 65–99)
Potassium: 4.1 mmol/L (ref 3.5–5.2)
Sodium: 141 mmol/L (ref 134–144)
Total Protein: 7.4 g/dL (ref 6.0–8.5)

## 2020-03-07 LAB — LIPID PANEL W/O CHOL/HDL RATIO
Cholesterol, Total: 170 mg/dL (ref 100–199)
HDL: 38 mg/dL — ABNORMAL LOW (ref >39)
LDL Chol Calc (NIH): 84 mg/dL (ref 0–99)
Triglycerides: 289 mg/dL — ABNORMAL HIGH (ref 0–149)
VLDL Cholesterol Cal: 48 mg/dL — ABNORMAL HIGH (ref 5–40)

## 2020-03-07 LAB — HCV AB W REFLEX TO QUANT PCR: HCV Ab: <0.1 s/co ratio (ref 0.0–0.9)

## 2020-03-07 LAB — PSA: Prostate Specific Ag, Serum: 0.7 ng/mL (ref 0.0–4.0)

## 2020-03-07 LAB — T4, FREE: Free T4: 0.94 ng/dL (ref 0.82–1.77)

## 2020-03-07 LAB — HCV INTERPRETATION

## 2020-03-07 LAB — TSH: TSH: 1.48 u[IU]/mL (ref 0.450–4.500)

## 2020-03-07 NOTE — Telephone Encounter (Signed)
Confirmed and screened for 03-09-20 ov. 

## 2020-03-08 NOTE — Progress Notes (Signed)
Overall, labs good. Review lipid panel. Discuss at visit 03/09/2020

## 2020-03-09 ENCOUNTER — Other Ambulatory Visit: Payer: Self-pay

## 2020-03-09 ENCOUNTER — Ambulatory Visit: Payer: Medicare Other | Admitting: Nurse Practitioner

## 2020-03-09 ENCOUNTER — Encounter: Payer: Self-pay | Admitting: Nurse Practitioner

## 2020-03-09 VITALS — BP 151/93 | HR 78 | Temp 97.7°F | Resp 16 | Ht 72.0 in | Wt 209.2 lb

## 2020-03-09 DIAGNOSIS — I10 Essential (primary) hypertension: Secondary | ICD-10-CM

## 2020-03-09 DIAGNOSIS — M170 Bilateral primary osteoarthritis of knee: Secondary | ICD-10-CM | POA: Diagnosis not present

## 2020-03-09 MED ORDER — HYDROCHLOROTHIAZIDE 12.5 MG PO TABS: 12.5000 mg | ORAL_TABLET | Freq: Every day | ORAL | 3 refills | Status: DC

## 2020-03-09 NOTE — Progress Notes (Signed)
[facility] [place], [place] [address]  Internal MEDICINE  Office Visit Note  Patient Name: Terry Harvey  [id number]  [id number]  Date of Service: 03/27/2020  Chief Complaint  Patient presents with  . Follow-up    review labs   . Hypertension  . Medication Management    naproxen side effects, hypertension and easily bruised   . Knee Pain    messaged dr who gave pt cortisone shot and has not gotten response     The patient is here for routine follow up visit. Blood pressure is mildly elevated today. Has been this way intermittently. Currently taking HCTZ 6.25mg  daily and bisoprolol 5mg  every day. He denies chest pain, chest pressure, or headaches.  He does have bilateral knee pain which is getting worse. He has seen orthopedic provider at [facility] and would like to go back to see him. He will take naproxen 220mg , two, twice daily, most days. Uses tramadol as well to help control his knee pain. Gradually, these medications are becoming less effective, both on their own, and together.       Current Medication: Outpatient Encounter Medications as of 03/09/2020  Medication Sig  . bisoprolol (ZEBETA) 5 MG tablet Take 1 tablet (5 mg total) by mouth daily.  . hydrochlorothiazide (HYDRODIURIL) 12.5 MG tablet Take 1 tablet (12.5 mg total) by mouth daily.  . Misc Natural Products (OSTEO BI-FLEX JOINT SHIELD PO) Take 2 tablets by mouth daily.   . Multiple Vitamin (ONE-A-DAY MENS PO) Take 1 tablet by mouth daily.   . naproxen sodium (ANAPROX) 220 MG tablet Take 220 mg by mouth 2 (two) times daily.   . pneumococcal 23 valent vaccine (PNEUMOVAX 23) 25 MCG/0.5ML injection Inject 0.82ml IM once  . traMADol (ULTRAM) 50 MG tablet Take 1 tablet (50 mg total) by mouth 3 (three) times daily.  . [DISCONTINUED] hydrochlorothiazide (HYDRODIURIL) 12.5 MG tablet Take 0.5 tablets (6.25 mg total) by mouth daily.   No facility-administered encounter medications on file  as of 03/09/2020.    Surgical History: Past Surgical History:  Procedure Laterality Date  . APPENDECTOMY  2009  . INGUINAL HERNIA REPAIR Right 11/12/2018   Procedure: HERNIA REPAIR INGUINAL ADULT;  Surgeon: Olean Ree, MD;  Location: [facility] ORS;  Service: General;  Laterality: Right;  . TUMOR REMOVAL  2002   from right side of neck    Medical History: Past Medical History:  Diagnosis Date  . Hyperlipidemia   . Hypertension   . Insomnia   . Osteoarthritis     Family History: Family History  Problem Relation Age of Onset  . Diabetes Mellitus II Mother   . Hypertension Father     Social History   Socioeconomic History  . Marital status: Married    Spouse name: Not on file  . Number of children: 3  . Years of education: Not on file  . Highest education level: Not on file  Occupational History  . Not on file  Tobacco Use  . Smoking status: Former Smoker    Start date: 11/19/2011  . Smokeless tobacco: Never Used  Substance and Sexual Activity  . Alcohol use: No    Alcohol/week: 0.0 standard drinks  . Drug use: No  . Sexual activity: Not on file  Other Topics Concern  . Not on file  Social History Narrative  . Not on file   Social Determinants of Health   Financial Resource Strain:   . Difficulty of Paying Living Expenses:  Food Insecurity:   . Worried About Charity fundraiser in the Last Year:   . Arboriculturist in the Last Year:   Transportation Needs:   . Film/video editor (Medical):   Marland Kitchen Lack of Transportation (Non-Medical):   Physical Activity:   . Days of Exercise per Week:   . Minutes of Exercise per Session:   Stress:   . Feeling of Stress :   Social Connections:   . Frequency of Communication with Friends and Family:   . Frequency of Social Gatherings with Friends and Family:   . Attends Religious Services:   . Active Member of Clubs or Organizations:   . Attends Archivist Meetings:   Marland Kitchen Marital Status:   Intimate Partner  Violence:   . Fear of Current or Ex-Partner:   . Emotionally Abused:   Marland Kitchen Physically Abused:   . Sexually Abused:       Review of Systems  Constitutional: Negative for activity change, chills, fatigue, fever and unexpected weight change.  HENT: Negative for congestion, postnasal drip, rhinorrhea, sinus pressure, sinus pain, sneezing and sore throat.   Respiratory: Negative for cough, chest tightness, shortness of breath and wheezing.   Cardiovascular: Negative for chest pain and palpitations.       Elevated blood pressure in the office.   Gastrointestinal: Negative for abdominal pain, constipation, diarrhea, nausea and vomiting.  Endocrine: Negative for cold intolerance, heat intolerance, polydipsia and polyuria.  Musculoskeletal: Negative for arthralgias, back pain, joint swelling and neck pain.       Bilateral knee pain.  Skin: Negative for rash.  Allergic/Immunologic: Negative for environmental allergies.  Neurological: Negative for dizziness, tremors, numbness and headaches.  Hematological: Negative for adenopathy. Does not bruise/bleed easily.  Psychiatric/Behavioral: Negative for behavioral problems (Depression), sleep disturbance and suicidal ideas. The patient is not nervous/anxious.    Today's Vitals   03/09/20 1501  BP: (!) 151/93  Pulse: 78  Resp: 16  Temp: 97.7 F (36.5 C)  SpO2: 94%  Weight: 209 lb 3.2 oz (94.9 kg)  Height: 6' (1.829 m)   Body mass index is 28.37 kg/m.  Physical Exam Vitals and nursing note reviewed.  Constitutional:      General: He is not in acute distress.    Appearance: Normal appearance. He is well-developed. He is not ill-appearing or diaphoretic.  HENT:     Head: Normocephalic and atraumatic.     Right Ear: External ear normal. Tympanic membrane is not erythematous.     Left Ear: External ear normal. Tympanic membrane is not erythematous.     Nose: Nose normal. No rhinorrhea.     Right Sinus: No frontal sinus tenderness.     Left  Sinus: No frontal sinus tenderness.     Mouth/Throat:     Pharynx: No oropharyngeal exudate or posterior oropharyngeal erythema.  Eyes:     Extraocular Movements: Extraocular movements intact.     Pupils: Pupils are equal, round, and reactive to light.  Neck:     Thyroid: No thyromegaly.     Vascular: No carotid bruit or JVD.     Trachea: No tracheal deviation.  Cardiovascular:     Rate and Rhythm: Normal rate. Rhythm irregular.     Pulses: Normal pulses.     Heart sounds: Normal heart sounds. No murmur. No friction rub. No gallop.      Comments: Mild tachycardia Pulmonary:     Effort: Pulmonary effort is normal. No respiratory distress.  Breath sounds: Normal breath sounds. No wheezing or rales.  Chest:     Chest wall: No tenderness.  Abdominal:     General: Abdomen is flat.     Palpations: Abdomen is soft.  Musculoskeletal:        General: Normal range of motion.     Cervical back: Normal range of motion and neck supple.     Comments: Bilateral knee pain. Mild swelling present along medial and lateral aspects of the knee. Moderate crepitus can be felt with flexion and extension of the knees     Lymphadenopathy:     Cervical: No cervical adenopathy.  Skin:    General: Skin is warm and dry.     Capillary Refill: Capillary refill takes less than 2 seconds.  Neurological:     General: No focal deficit present.     Mental Status: He is alert and oriented to person, place, and time.     Cranial Nerves: No cranial nerve deficit.  Psychiatric:        Mood and Affect: Mood normal.        Behavior: Behavior normal.        Thought Content: Thought content normal.        Judgment: Judgment normal.   Assessment/Plan: 1. Hypertension, unspecified type Increase HCTZ to 12.5mg  daily. Continue bisoprolol at current dose. Advised patient to limit salt and increase water intake in the diet.  - hydrochlorothiazide (HYDRODIURIL) 12.5 MG tablet; Take 1 tablet (12.5 mg total) by mouth  daily.  Dispense: 90 tablet; Refill: 3  2. Primary osteoarthritis of both knees Patient ma continue to take naproxen twice daily as needed and tramadol as needed and as prescribed. New referral to orthopedic provider made today.  - Ambulatory referral to Orthopedic Surgery  General Counseling: Cypress verbalizes understanding of the findings of todays visit and agrees with plan of treatment. I have discussed any further diagnostic evaluation that may be needed or ordered today. We also reviewed his medications today. he has been encouraged to call the office with any questions or concerns that should arise related to todays visit.  Hypertension Counseling:   The following hypertensive lifestyle modification were recommended and discussed:  1. Limiting alcohol intake to less than 1 oz/day of ethanol:(24 oz of beer or 8 oz of wine or 2 oz of 100-proof whiskey). 2. Take baby ASA 81 mg daily. 3. Importance of regular aerobic exercise and losing weight. 4. Reduce dietary saturated fat and cholesterol intake for overall cardiovascular health. 5. Maintaining adequate dietary potassium, calcium, and magnesium intake. 6. Regular monitoring of the blood pressure. 7. Reduce sodium intake to less than 100 mmol/day (less than 2.3 gm of sodium or less than 6 gm of sodium choride)   This patient was seen by Wallace with Dr Lavera Guise as a part of collaborative care agreement  Orders Placed This Encounter  Procedures  . Ambulatory referral to Orthopedic Surgery    Meds ordered this encounter  Medications  . hydrochlorothiazide (HYDRODIURIL) 12.5 MG tablet    Sig: Take 1 tablet (12.5 mg total) by mouth daily.    Dispense:  90 tablet    Refill:  3    Updating prescription. Does not need refills at this time.    Order Specific Question:   Supervising Provider    Answer:   Lavera Guise [id number]    Total time spent: 30 Minutes   Time spent includes review of chart,  medications, test  results, and follow up plan with the patient.      Dr Lavera Guise Internal medicine

## 2020-03-23 ENCOUNTER — Encounter: Payer: Self-pay | Admitting: Nurse Practitioner

## 2020-03-28 ENCOUNTER — Encounter: Payer: Self-pay | Admitting: Nurse Practitioner

## 2020-03-29 ENCOUNTER — Other Ambulatory Visit: Payer: Self-pay

## 2020-03-29 DIAGNOSIS — I10 Essential (primary) hypertension: Secondary | ICD-10-CM

## 2020-03-29 MED ORDER — HYDROCHLOROTHIAZIDE 12.5 MG PO TABS: 12.5000 mg | ORAL_TABLET | Freq: Every day | ORAL | 3 refills | Status: DC

## 2020-03-30 NOTE — Telephone Encounter (Signed)
Can you print this out so I can fill for patient? Thanks

## 2020-04-03 DIAGNOSIS — M170 Bilateral primary osteoarthritis of knee: Secondary | ICD-10-CM | POA: Diagnosis not present

## 2020-04-19 ENCOUNTER — Other Ambulatory Visit: Payer: Self-pay

## 2020-04-19 DIAGNOSIS — I10 Essential (primary) hypertension: Secondary | ICD-10-CM

## 2020-04-19 MED ORDER — HYDROCHLOROTHIAZIDE 12.5 MG PO TABS: 12.5000 mg | ORAL_TABLET | Freq: Every day | ORAL | 3 refills | Status: DC

## 2020-04-21 ENCOUNTER — Other Ambulatory Visit: Payer: Self-pay

## 2020-04-21 DIAGNOSIS — I10 Essential (primary) hypertension: Secondary | ICD-10-CM

## 2020-04-21 MED ORDER — HYDROCHLOROTHIAZIDE 12.5 MG PO TABS: ORAL_TABLET | ORAL | 3 refills | Status: DC

## 2020-04-25 ENCOUNTER — Other Ambulatory Visit: Payer: Self-pay

## 2020-04-25 DIAGNOSIS — I10 Essential (primary) hypertension: Secondary | ICD-10-CM

## 2020-04-25 MED ORDER — HYDROCHLOROTHIAZIDE 12.5 MG PO TABS: ORAL_TABLET | ORAL | 3 refills | Status: DC

## 2020-05-10 IMAGING — CT CT ABD-PELV W/ CM
2 of 5 series · 14 of 46 positions shown, 16 images · IV contrast (iopamidol)
Comparison: 10/09/2008 CT.

CLINICAL DATA: 67-year-old male with right lower quadrant hernia.
Post appendectomy. Initial encounter.

EXAM:
CT ABDOMEN AND PELVIS WITH CONTRAST
TECHNIQUE: Multidetector CT imaging of the abdomen and pelvis was performed
using the standard protocol following bolus administration of
intravenous contrast.
CONTRAST:  100mL PNWTDK-044 IOPAMIDOL (PNWTDK-044) INJECTION 61%

[Series 2: abd pelvis · axial · 0.84mm/px · z∈[-1616,-1176]mm · 11 of 100 slices shown, 13 images (1 of 2)]
[im 6/100  soft-tissue]
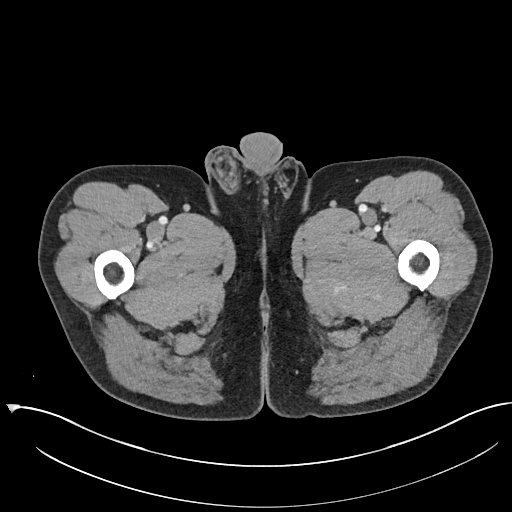
[im 6/100  bone]
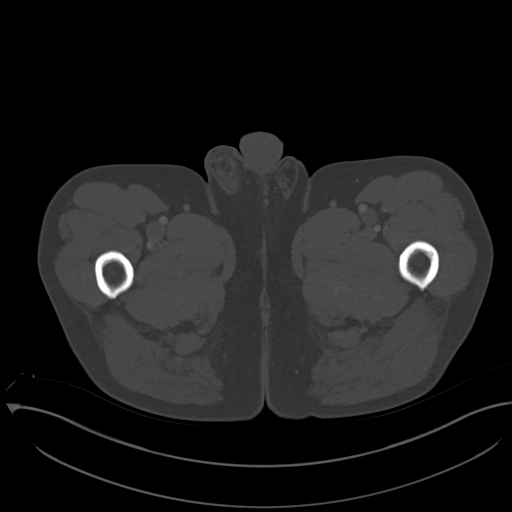
[im 17/100  soft-tissue]
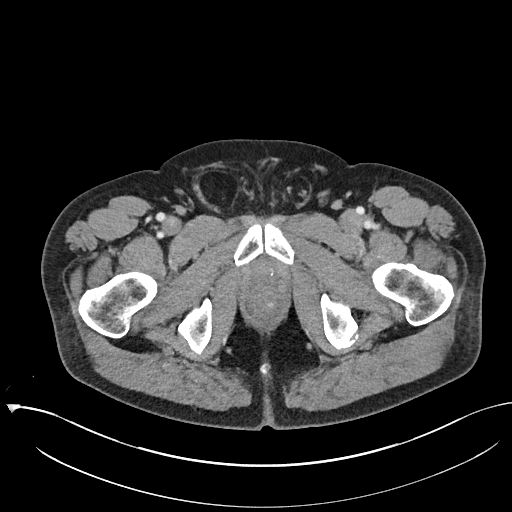
[im 23/100  soft-tissue]
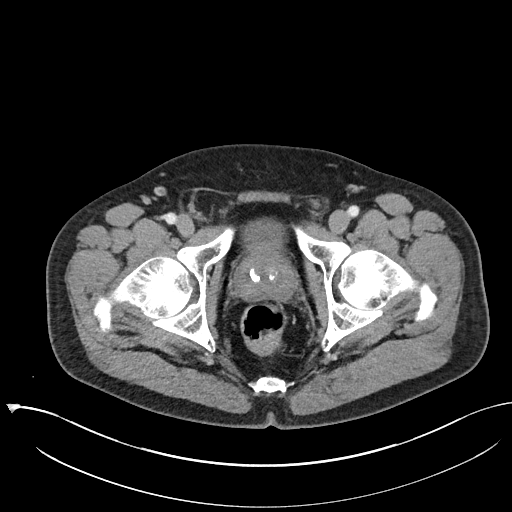
[im 34/100  soft-tissue]
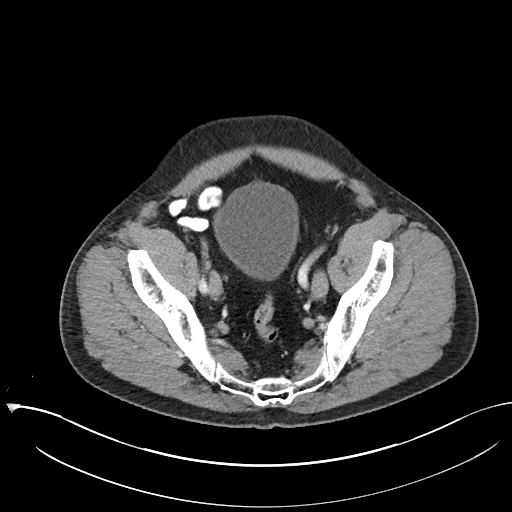
[im 39/100  soft-tissue]
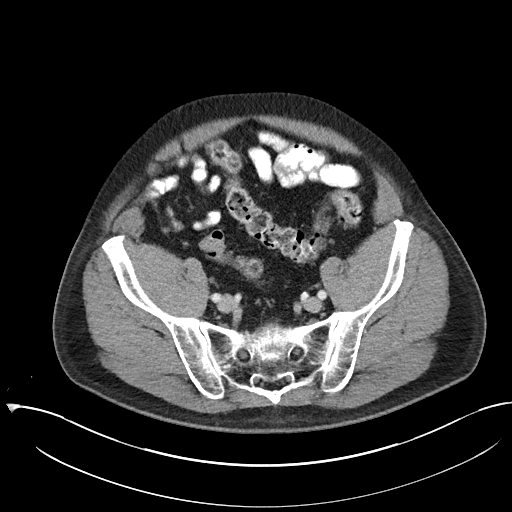
[im 50/100  soft-tissue]
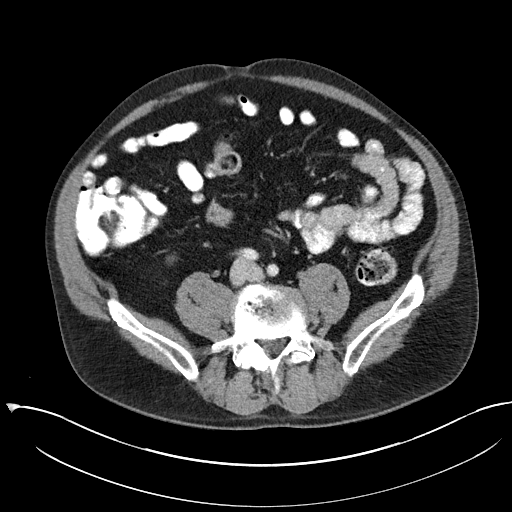
[im 61/100  soft-tissue]
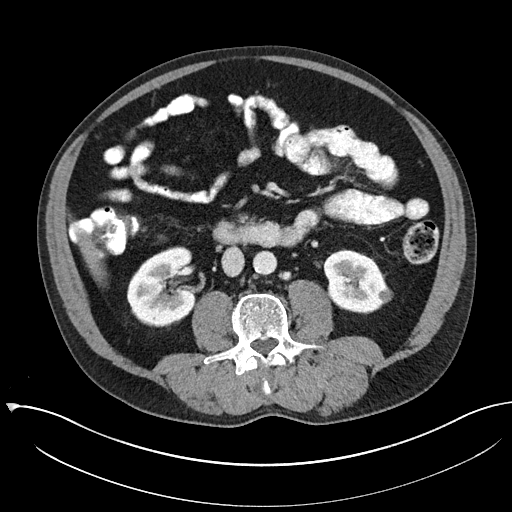
[im 67/100  soft-tissue]
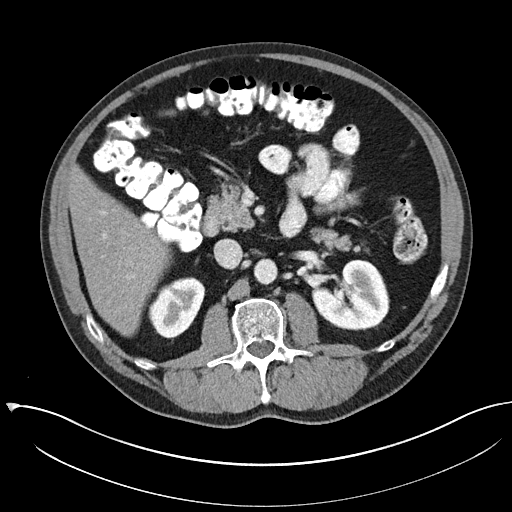
[im 78/100  soft-tissue]
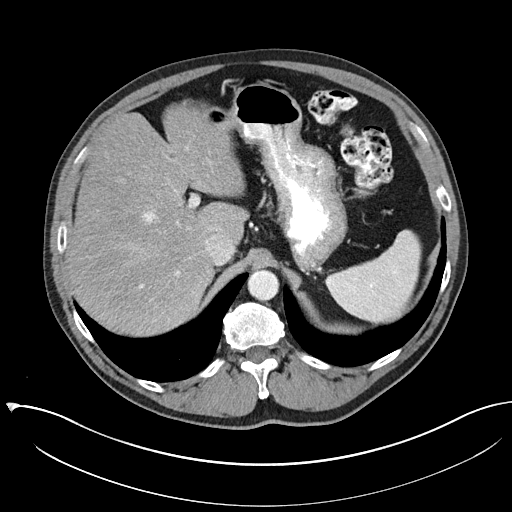
[im 78/100  bone]
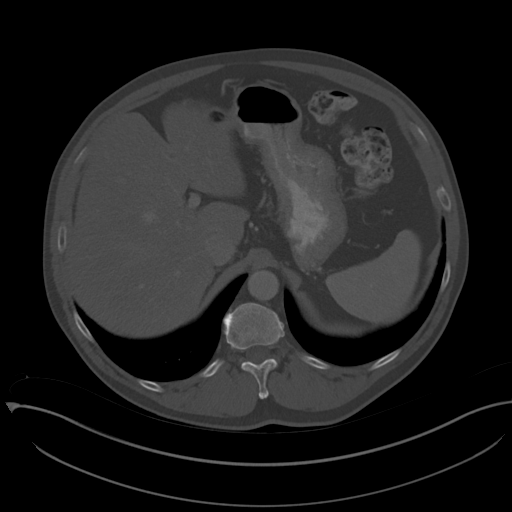
[im 83/100  soft-tissue]
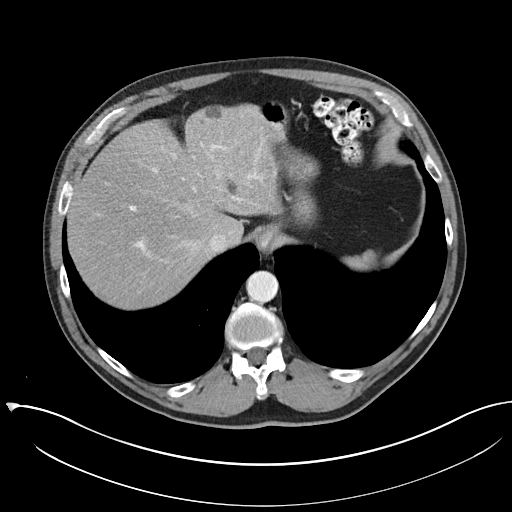
[im 94/100  soft-tissue]
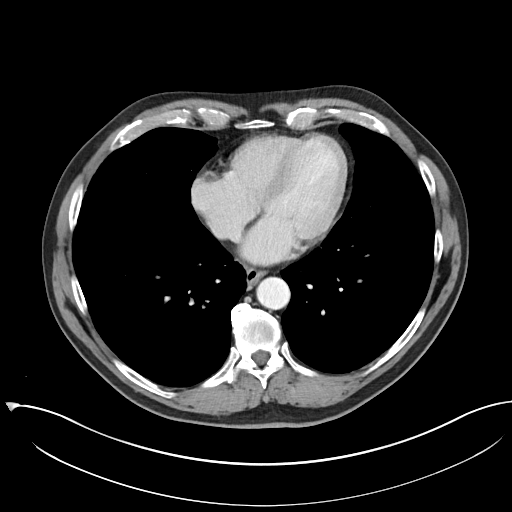

[Series 4: abd pelvis · coronal · 0.76mm/px · 3 of 164 slices shown (2 of 2)]
[im 55/164  soft-tissue]
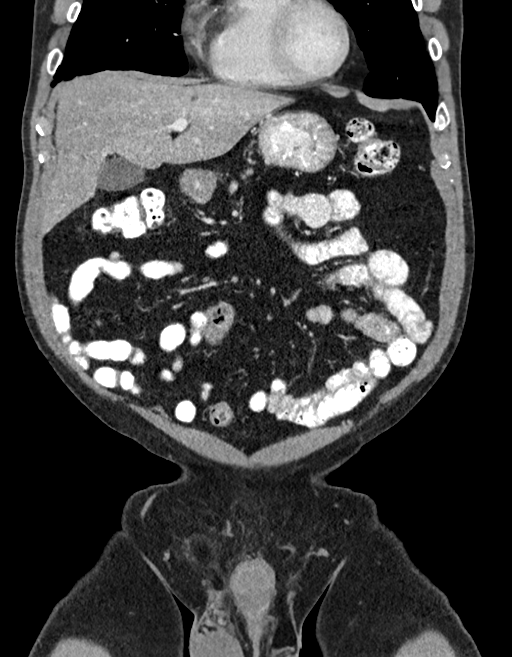
[im 73/164  soft-tissue]
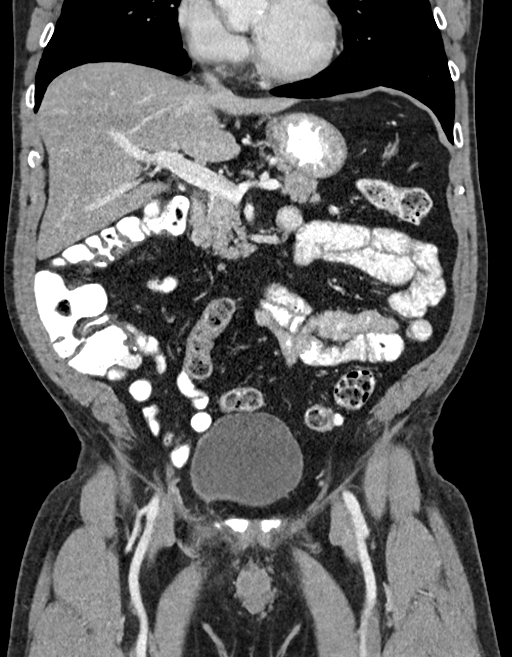
[im 91/164  soft-tissue]
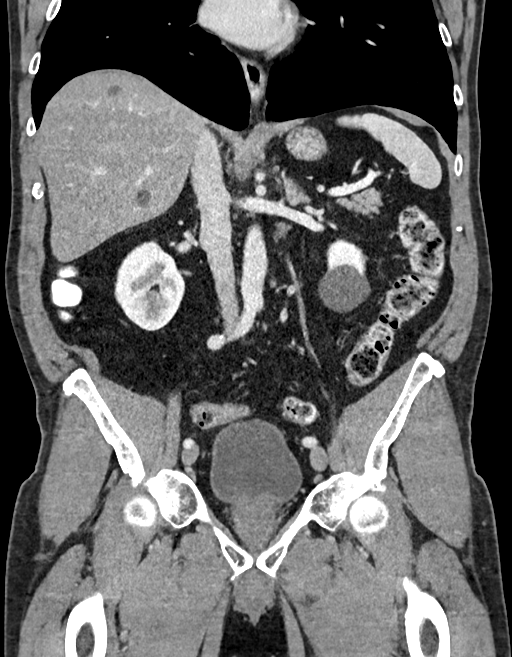

[14 of 46 positions shown; findings below may reference images not displayed]

FINDINGS: Lower chest: 5 mm nodule left lung base (series 3, image 1) possibly
a lymph node in major fissure but cannot exclude primary pulmonary
nodule. This area was not imaged previously.

Heart size within normal limits. Right coronary artery
calcification.

Hepatobiliary: Fatty liver. Multiple low-density liver lesions,
larger ones which are cysts and others too small to adequately
characterize. Several of these were present in 7551.

No calcified gallstone or CT evidence of gallbladder inflammation.

Pancreas: No worrisome pancreatic lesion or inflammation.

Spleen: No splenic lesion or enlargement.

Adrenals/Urinary Tract: No obstructing stone or hydronephrosis.
Bilateral low-density renal lesions largest lower pole left kidney
measures up to 4 cm. Some of the low-density structures are cysts
whereas others cannot be confirmed as simple cyst by present exam.

No adrenal lesion.

Noncontrast filled views of the urinary bladder without lesion
identified.

Stomach/Bowel: No extraluminal bowel inflammatory process noted.
Knuckle of small bowel extends towards superior margin of the right
inguinal canal but does not enter such.

Portions of the stomach, colon and small bowel are under distended
limiting evaluation for detection of a mass.

Vascular/Lymphatic: Atherosclerotic changes abdominal aorta without
aneurysm. Atherosclerotic changes aortic branch vessels without
large vessel occlusion.

Top-normal size peripancreatic lymph node. Scattered normal size
lymph nodes otherwise noted.

Reproductive: Slightly enlarged minimally lobulated prostate gland
with central calcifications. Mild prominence seminal vesicles.

Other: Right inguinal canal fat and vessel containing hernia. A
knuckle of small bowel extends to the superior margin of the right
inguinal canal but currently does not enter such. Right lateral
aspect of the urinary bladder extends towards the inguinal canal but
does not enter such. No free intraperitoneal air.

Musculoskeletal: Mild curvature lower thoracic and lumbar spine
convex right. Degenerative changes L2-3 through L5-S1. Disc space
narrowing greatest at the L4-5 level and more notable on the right.
Bilateral hip joint degenerative changes with subchondral cysts.
IMPRESSION: 1. Right inguinal canal fat and vessel containing hernia. Knuckle of
small bowel extends to the superior margin of the right inguinal
canal but currently does not enter such. Right lateral aspect of the
urinary bladder extends towards the inguinal canal but does not
enter such.
2. Bilateral low-density renal lesions largest lower pole left
kidney measures up to 4 cm. Some of the low-density structures are
cysts whereas others cannot be confirmed as simple cysts by present
exam. Dedicated contrast enhanced renal MR would be necessary for
further delineation if clinically desired.
3. Slightly enlarged minimally lobulated prostate gland.
4. Aortic Atherosclerosis ([id number]-AP0.0). Right coronary artery
calcification.
5. Fatty liver. Multiple low-density liver lesions, larger ones
which are cysts and others too small to adequately characterize.
Several of these were present in 7551.
6. Degenerative changes lower lumbar spine most notable L4-5 level.
Bilateral hip joint degenerative changes.
7. 5 mm nodule left lung base (series 3, image 1). No follow-up
needed if patient is low-risk. Non-contrast chest CT can be
considered in 12 months if patient is high-risk. This recommendation
follows the consensus statement: Guidelines for Management of
Incidental Pulmonary Nodules Detected on CT Images: From the

## 2020-05-25 DIAGNOSIS — Z79891 Long term (current) use of opiate analgesic: Secondary | ICD-10-CM | POA: Diagnosis not present

## 2020-05-25 DIAGNOSIS — Z6830 Body mass index (BMI) 30.0-30.9, adult: Secondary | ICD-10-CM | POA: Diagnosis not present

## 2020-05-25 DIAGNOSIS — I10 Essential (primary) hypertension: Secondary | ICD-10-CM | POA: Diagnosis not present

## 2020-05-25 DIAGNOSIS — E669 Obesity, unspecified: Secondary | ICD-10-CM | POA: Diagnosis not present

## 2020-05-25 DIAGNOSIS — Z8249 Family history of ischemic heart disease and other diseases of the circulatory system: Secondary | ICD-10-CM | POA: Diagnosis not present

## 2020-05-25 DIAGNOSIS — Z833 Family history of diabetes mellitus: Secondary | ICD-10-CM | POA: Diagnosis not present

## 2020-05-25 DIAGNOSIS — G8929 Other chronic pain: Secondary | ICD-10-CM | POA: Diagnosis not present

## 2020-05-25 DIAGNOSIS — M1990 Unspecified osteoarthritis, unspecified site: Secondary | ICD-10-CM | POA: Diagnosis not present

## 2020-05-25 DIAGNOSIS — K08409 Partial loss of teeth, unspecified cause, unspecified class: Secondary | ICD-10-CM | POA: Diagnosis not present

## 2020-05-25 DIAGNOSIS — K219 Gastro-esophageal reflux disease without esophagitis: Secondary | ICD-10-CM | POA: Diagnosis not present

## 2020-06-05 ENCOUNTER — Other Ambulatory Visit: Payer: Self-pay

## 2020-06-05 DIAGNOSIS — I10 Essential (primary) hypertension: Secondary | ICD-10-CM

## 2020-06-05 MED ORDER — BISOPROLOL FUMARATE 5 MG PO TABS: 5.0000 mg | ORAL_TABLET | Freq: Every day | ORAL | 1 refills | Status: DC

## 2020-07-06 ENCOUNTER — Telehealth: Payer: Self-pay

## 2020-07-06 NOTE — Telephone Encounter (Signed)
Lmom to confirm and screen for 07-10-20 ov.

## 2020-07-10 ENCOUNTER — Encounter: Payer: Self-pay | Admitting: Nurse Practitioner

## 2020-07-10 ENCOUNTER — Other Ambulatory Visit: Payer: Self-pay

## 2020-07-10 ENCOUNTER — Ambulatory Visit (INDEPENDENT_AMBULATORY_CARE_PROVIDER_SITE_OTHER): Payer: Medicare HMO | Admitting: Adult Health

## 2020-07-10 VITALS — BP 132/74 | HR 84 | Temp 98.0°F | Resp 16 | Ht 72.0 in | Wt 207.8 lb

## 2020-07-10 DIAGNOSIS — D229 Melanocytic nevi, unspecified: Secondary | ICD-10-CM | POA: Diagnosis not present

## 2020-07-10 DIAGNOSIS — I10 Essential (primary) hypertension: Secondary | ICD-10-CM | POA: Diagnosis not present

## 2020-07-10 DIAGNOSIS — M170 Bilateral primary osteoarthritis of knee: Secondary | ICD-10-CM | POA: Diagnosis not present

## 2020-07-10 MED ORDER — TRAMADOL HCL 50 MG PO TABS: 50.0000 mg | ORAL_TABLET | Freq: Three times a day (TID) | ORAL | 1 refills | Status: DC

## 2020-07-10 NOTE — Progress Notes (Signed)
[facility] [place], [place] [address]  Internal MEDICINE  Office Visit Note  Patient Name: Terry Harvey  [id number]  [id number]  Date of Service: 07/10/2020  Chief Complaint  Patient presents with  . Follow-up    spot on abd, left knee popping and slightly painful  . Hypertension  . Hyperlipidemia  . Quality Metric Gaps    TDAP, PNA    HPI  Pt is here for follow up. He has a history of HTN, and HLD.  He denies any issues at this time.  He has a discolored, raised skin area near his umbilicus.  He reports it has been present for about 4 months.  He feels like it has gotten bigger, and that it is changing colors.  It also has irregular borders. He denies any history of skin cancers.    Current Medication: Outpatient Encounter Medications as of 07/10/2020  Medication Sig  . bisoprolol (ZEBETA) 5 MG tablet Take 1 tablet (5 mg total) by mouth daily.  . hydrochlorothiazide (HYDRODIURIL) 12.5 MG tablet Take 1 tablet by mouth everyday  . Misc Natural Products (OSTEO BI-FLEX JOINT SHIELD PO) Take 2 tablets by mouth daily.   . Multiple Vitamin (ONE-A-DAY MENS PO) Take 1 tablet by mouth daily.   . naproxen sodium (ANAPROX) 220 MG tablet Take 220 mg by mouth 2 (two) times daily.   . traMADol (ULTRAM) 50 MG tablet Take 1 tablet (50 mg total) by mouth 3 (three) times daily.  . [DISCONTINUED] traMADol (ULTRAM) 50 MG tablet Take 1 tablet (50 mg total) by mouth 3 (three) times daily.  . pneumococcal 23 valent vaccine (PNEUMOVAX 23) 25 MCG/0.5ML injection Inject 0.108ml IM once (Patient not taking: Reported on 07/10/2020)   No facility-administered encounter medications on file as of 07/10/2020.    Surgical History: Past Surgical History:  Procedure Laterality Date  . APPENDECTOMY  2009  . INGUINAL HERNIA REPAIR Right 11/12/2018   Procedure: HERNIA REPAIR INGUINAL ADULT;  Surgeon: Olean Ree, MD;  Location: [facility] ORS;  Service: General;  Laterality: Right;   . TUMOR REMOVAL  2002   from right side of neck    Medical History: Past Medical History:  Diagnosis Date  . Hyperlipidemia   . Hypertension   . Insomnia   . Osteoarthritis     Family History: Family History  Problem Relation Age of Onset  . Diabetes Mellitus II Mother   . Hypertension Father     Social History   Socioeconomic History  . Marital status: Married    Spouse name: Not on file  . Number of children: 3  . Years of education: Not on file  . Highest education level: Not on file  Occupational History  . Not on file  Tobacco Use  . Smoking status: Former Smoker    Start date: 11/19/2011  . Smokeless tobacco: Never Used  Vaping Use  . Vaping Use: Never used  Substance and Sexual Activity  . Alcohol use: No    Alcohol/week: 0.0 standard drinks  . Drug use: No  . Sexual activity: Not on file  Other Topics Concern  . Not on file  Social History Narrative  . Not on file   Social Determinants of Health   Financial Resource Strain:   . Difficulty of Paying Living Expenses: Not on file  Food Insecurity:   . Worried About Charity fundraiser in the Last Year: Not on file  . Ran Out of Food in the  Last Year: Not on file  Transportation Needs:   . Lack of Transportation (Medical): Not on file  . Lack of Transportation (Non-Medical): Not on file  Physical Activity:   . Days of Exercise per Week: Not on file  . Minutes of Exercise per Session: Not on file  Stress:   . Feeling of Stress : Not on file  Social Connections:   . Frequency of Communication with Friends and Family: Not on file  . Frequency of Social Gatherings with Friends and Family: Not on file  . Attends Religious Services: Not on file  . Active Member of Clubs or Organizations: Not on file  . Attends Archivist Meetings: Not on file  . Marital Status: Not on file  Intimate Partner Violence:   . Fear of Current or Ex-Partner: Not on file  . Emotionally Abused: Not on file  .  Physically Abused: Not on file  . Sexually Abused: Not on file      Review of Systems  Constitutional: Negative.  Negative for chills, fatigue and unexpected weight change.  HENT: Negative.  Negative for congestion, rhinorrhea, sneezing and sore throat.   Eyes: Negative for redness.  Respiratory: Negative.  Negative for cough, chest tightness and shortness of breath.   Cardiovascular: Negative.  Negative for chest pain and palpitations.  Gastrointestinal: Negative.  Negative for abdominal pain, constipation, diarrhea, nausea and vomiting.  Endocrine: Negative.   Genitourinary: Negative.  Negative for dysuria and frequency.  Musculoskeletal: Negative.  Negative for arthralgias, back pain, joint swelling and neck pain.  Skin: Negative.  Negative for rash.       Irregular mole on abodman,   Allergic/Immunologic: Negative.   Neurological: Negative.  Negative for tremors and numbness.  Hematological: Negative for adenopathy. Does not bruise/bleed easily.  Psychiatric/Behavioral: Negative.  Negative for behavioral problems, sleep disturbance and suicidal ideas. The patient is not nervous/anxious.     Vital Signs: BP 132/74   Pulse 84   Temp 98 F (36.7 C)   Resp 16   Ht 6' (1.829 m)   Wt 207 lb 12.8 oz (94.3 kg)   SpO2 91%   BMI 28.18 kg/m    Physical Exam Vitals and nursing note reviewed.  Constitutional:      General: He is not in acute distress.    Appearance: He is well-developed. He is not diaphoretic.  HENT:     Head: Normocephalic and atraumatic.     Mouth/Throat:     Pharynx: No oropharyngeal exudate.  Eyes:     Pupils: Pupils are equal, round, and reactive to light.  Neck:     Thyroid: No thyromegaly.     Vascular: No JVD.     Trachea: No tracheal deviation.  Cardiovascular:     Rate and Rhythm: Normal rate and regular rhythm.     Heart sounds: Normal heart sounds. No murmur heard.  No friction rub. No gallop.   Pulmonary:     Effort: Pulmonary effort is  normal. No respiratory distress.     Breath sounds: Normal breath sounds. No wheezing or rales.  Chest:     Chest wall: No tenderness.  Abdominal:     Palpations: Abdomen is soft.     Tenderness: There is no abdominal tenderness. There is no guarding.  Musculoskeletal:        General: Normal range of motion.     Cervical back: Normal range of motion and neck supple.  Lymphadenopathy:     Cervical: No cervical  adenopathy.  Skin:    General: Skin is warm and dry.     Comments: Lesion/mole, raised, discolored, irregular borders on abdomen near umbilicus.   Neurological:     Mental Status: He is alert and oriented to person, place, and time.     Cranial Nerves: No cranial nerve deficit.  Psychiatric:        Behavior: Behavior normal.        Thought Content: Thought content normal.        Judgment: Judgment normal.    Assessment/Plan: 1. Hypertension, unspecified type Well controlled, continue current management.   2. Atypical mole Pt needs skin check , and examine new lesion. - Ambulatory referral to Dermatology  3. Primary osteoarthritis of both knees Reviewed risks and possible side effects associated with taking opiates, benzodiazepines and other CNS depressants. Combination of these could cause dizziness and drowsiness. Advised patient not to drive or operate machinery when taking these medications, as patient's and other's life can be at risk and will have consequences. Patient verbalized understanding in this matter. Dependence and abuse for these drugs will be monitored closely. A Controlled substance policy and procedure is on file which allows [facility] to order a urine drug screen test at any visit. Patient understands and agrees with the plan - traMADol (ULTRAM) 50 MG tablet; Take 1 tablet (50 mg total) by mouth 3 (three) times daily.  Dispense: 90 tablet; Refill: 1  General Counseling: Mycal verbalizes understanding of the findings of todays visit and agrees  with plan of treatment. I have discussed any further diagnostic evaluation that may be needed or ordered today. We also reviewed his medications today. he has been encouraged to call the office with any questions or concerns that should arise related to todays visit.    Orders Placed This Encounter  Procedures  . Ambulatory referral to Dermatology    Meds ordered this encounter  Medications  . traMADol (ULTRAM) 50 MG tablet    Sig: Take 1 tablet (50 mg total) by mouth 3 (three) times daily.    Dispense:  90 tablet    Refill:  1    Time spent: 30 Minutes   This patient was seen by Orson Gear AGNP-C in Collaboration with Dr Lavera Guise as a part of collaborative care agreement     Kendell Bane AGNP-C Internal medicine

## 2020-08-01 DIAGNOSIS — R69 Illness, unspecified: Secondary | ICD-10-CM | POA: Diagnosis not present

## 2020-09-18 ENCOUNTER — Other Ambulatory Visit: Payer: Self-pay

## 2020-09-18 DIAGNOSIS — I10 Essential (primary) hypertension: Secondary | ICD-10-CM

## 2020-09-18 MED ORDER — BISOPROLOL FUMARATE 5 MG PO TABS: 5.0000 mg | ORAL_TABLET | Freq: Every day | ORAL | 1 refills | Status: DC

## 2020-09-19 ENCOUNTER — Other Ambulatory Visit: Payer: Self-pay

## 2020-09-19 ENCOUNTER — Encounter: Payer: Self-pay | Admitting: Internal Medicine

## 2020-09-19 ENCOUNTER — Ambulatory Visit (INDEPENDENT_AMBULATORY_CARE_PROVIDER_SITE_OTHER): Payer: Medicare HMO | Admitting: Internal Medicine

## 2020-09-19 DIAGNOSIS — I700 Atherosclerosis of aorta: Secondary | ICD-10-CM

## 2020-09-19 DIAGNOSIS — I10 Essential (primary) hypertension: Secondary | ICD-10-CM

## 2020-09-19 DIAGNOSIS — E782 Mixed hyperlipidemia: Secondary | ICD-10-CM | POA: Diagnosis not present

## 2020-09-19 DIAGNOSIS — M170 Bilateral primary osteoarthritis of knee: Secondary | ICD-10-CM | POA: Diagnosis not present

## 2020-09-19 MED ORDER — TRAMADOL HCL 50 MG PO TABS: ORAL_TABLET | ORAL | 2 refills | Status: DC

## 2020-09-19 NOTE — Progress Notes (Signed)
[facility] [place], [place] [address]  Internal MEDICINE  Office Visit Note  Patient Name: Terry Harvey  [id number]  [id number]  Date of Service: 09/25/2020  Chief Complaint  Patient presents with  . Follow-up    left knee pain under knee cap, cant flex it like the other, pain comes and goes  . Hyperlipidemia  . Hypertension  . policy update form  . Quality Metric Gaps    PNA     HPI Pt is here for routine health maintenance examination. C/o knee pain and has to take tramadol every day to go on with his ADLs, has not had evaluation from ortho, does not know if he had a recent xray. CT scan of abdomen /pelvis reviewed from 2019 with multiple abnormal findings. He had stopped smoking in 2013, CT showed hepatorenal cystic disease,. Aortic atherosclerosis and calcification in right coronary. Pt had abnormal lipid profile as well which is not treated . Had an incidental nodule which he will need CT lung follow up   Current Medication: Outpatient Encounter Medications as of 09/19/2020  Medication Sig  . bisoprolol (ZEBETA) 5 MG tablet Take 1 tablet (5 mg total) by mouth daily.  . hydrochlorothiazide (HYDRODIURIL) 12.5 MG tablet Take 1 tablet by mouth everyday  . Misc Natural Products (OSTEO BI-FLEX JOINT SHIELD PO) Take 2 tablets by mouth daily.   . Multiple Vitamin (ONE-A-DAY MENS PO) Take 1 tablet by mouth daily.   . naproxen sodium (ANAPROX) 220 MG tablet Take 220 mg by mouth 2 (two) times daily.   . rosuvastatin (CRESTOR) 5 MG tablet Take 1 tablet (5 mg total) by mouth daily.  . traMADol (ULTRAM) 50 MG tablet Take one tab po tid for knee OA  . [DISCONTINUED] pneumococcal 23 valent vaccine (PNEUMOVAX 23) 25 MCG/0.5ML injection Inject 0.81ml IM once (Patient not taking: Reported on 07/10/2020)  . [DISCONTINUED] traMADol (ULTRAM) 50 MG tablet Take 1 tablet (50 mg total) by mouth 3 (three) times daily.   No facility-administered encounter medications  on file as of 09/19/2020.    Surgical History: Past Surgical History:  Procedure Laterality Date  . APPENDECTOMY  2009  . INGUINAL HERNIA REPAIR Right 11/12/2018   Procedure: HERNIA REPAIR INGUINAL ADULT;  Surgeon: Olean Ree, MD;  Location: [facility] ORS;  Service: General;  Laterality: Right;  . TUMOR REMOVAL  2002   from right side of neck    Medical History: Past Medical History:  Diagnosis Date  . Hyperlipidemia   . Hypertension   . Insomnia   . Osteoarthritis     Family History: Family History  Problem Relation Age of Onset  . Diabetes Mellitus II Mother   . Hypertension Father    Social History   Social History Narrative  . Not on file     Social Connections:   . Frequency of Communication with Friends and Family: Not on file  . Frequency of Social Gatherings with Friends and Family: Not on file  . Attends Religious Services: Not on file  . Active Member of Clubs or Organizations: Not on file  . Attends Archivist Meetings: Not on file  . Marital Status: Not on file     Tobacco Use: Medium Risk  . Smoking Tobacco Use: Former Smoker  . Smokeless Tobacco Use: Never Used    Review of Systems  Constitutional: Negative for chills, fatigue and unexpected weight change.  HENT: Positive for postnasal drip. Negative for congestion, rhinorrhea, sneezing and sore  throat.   Eyes: Negative for redness.  Respiratory: Negative for cough, chest tightness and shortness of breath.   Cardiovascular: Negative for chest pain and palpitations.  Gastrointestinal: Negative for abdominal pain, constipation, diarrhea, nausea and vomiting.  Genitourinary: Negative for dysuria and frequency.  Musculoskeletal: Positive for arthralgias, back pain and joint swelling. Negative for neck pain.  Skin: Negative for rash.  Neurological: Negative.  Negative for tremors and numbness.  Hematological: Negative for adenopathy. Does not bruise/bleed easily.  Psychiatric/Behavioral:  Negative for behavioral problems (Depression), sleep disturbance and suicidal ideas. The patient is not nervous/anxious.      Vital Signs: BP (!) 142/88   Pulse 80   Temp 98 F (36.7 C)   Resp 16   Ht 5\' 11"  (1.803 m)   Wt 208 lb 9.6 oz (94.6 kg)   SpO2 96%   BMI 29.09 kg/m    Physical Exam Constitutional:      General: He is not in acute distress.    Appearance: He is well-developed. He is not diaphoretic.  HENT:     Head: Normocephalic and atraumatic.     Mouth/Throat:     Pharynx: No oropharyngeal exudate.  Eyes:     Pupils: Pupils are equal, round, and reactive to light.  Neck:     Thyroid: No thyromegaly.     Vascular: No JVD.     Trachea: No tracheal deviation.  Cardiovascular:     Rate and Rhythm: Normal rate and regular rhythm.     Heart sounds: Normal heart sounds. No murmur heard.  No friction rub. No gallop.   Pulmonary:     Effort: Pulmonary effort is normal. No respiratory distress.     Breath sounds: No wheezing or rales.  Chest:     Chest wall: No tenderness.  Abdominal:     General: Bowel sounds are normal.     Palpations: Abdomen is soft.  Musculoskeletal:        General: Swelling and tenderness present.     Cervical back: Normal range of motion and neck supple.  Lymphadenopathy:     Cervical: No cervical adenopathy.  Skin:    General: Skin is warm and dry.  Neurological:     Mental Status: He is alert and oriented to person, place, and time.     Cranial Nerves: No cranial nerve deficit.  Psychiatric:        Behavior: Behavior normal.        Thought Content: Thought content normal.        Judgment: Judgment normal.    Assessment/Plan: 1. Aortic atherosclerosis ([place]) Will need another vascular surgery, start Crestor 5 mg po qd   2. Primary osteoarthritis of both knees Xray from 2011 showed DJD, pt might need to see ortho for further DX and treatment  - traMADol (ULTRAM) 50 MG tablet; Take one tab po tid for knee OA  Dispense: 90 tablet;  Refill: 2  3. Primary hypertension Continue meds as before, blood pressure is elevated but repeat is 130/80. Will continue to monitor, might want to change hctz to combo therapy ( losartan hctz 50/12.5 on next visit )   4. Mixed hyperlipidemia Start low dose Crestor, will reeat lipid profile  - rosuvastatin (CRESTOR) 5 MG tablet; Take 1 tablet (5 mg total) by mouth daily.  Dispense: 90 tablet; Refill: 3 - Lipid Panel With LDL/HDL Ratio  General Counseling: Christophor verbalizes understanding of the findings of todays visit and agrees with plan of treatment. I have discussed  any further diagnostic evaluation that may be needed or ordered today. We also reviewed his medications today. he has been encouraged to call the office with any questions or concerns that should arise related to todays visit.  Counseling: Cardiac risk factor modification:  1. Control blood pressure. 2. Exercise as prescribed. 3. Follow low sodium, low fat diet. and low fat and low cholestrol diet. 4. Take ASA 81mg  once a day. 5. Restricted calories diet to lose weight.   Orders Placed This Encounter  Procedures  . Lipid Panel With LDL/HDL Ratio    Meds ordered this encounter  Medications  . traMADol (ULTRAM) 50 MG tablet    Sig: Take one tab po tid for knee OA    Dispense:  90 tablet    Refill:  2  . rosuvastatin (CRESTOR) 5 MG tablet    Sig: Take 1 tablet (5 mg total) by mouth daily.    Dispense:  90 tablet    Refill:  3    Total time spent: 30 Minutes  Time spent includes review of chart, medications, test results, and follow up plan with the patient.     Lavera Guise, MD  Internal Medicine

## 2020-09-25 ENCOUNTER — Encounter: Payer: Self-pay | Admitting: Internal Medicine

## 2020-09-25 MED ORDER — ROSUVASTATIN CALCIUM 5 MG PO TABS: 5.0000 mg | ORAL_TABLET | Freq: Every day | ORAL | 3 refills | Status: DC

## 2020-09-27 ENCOUNTER — Telehealth: Payer: Self-pay

## 2020-09-27 NOTE — Telephone Encounter (Signed)
-----   Message from Lavera Guise, MD sent at 09/25/2020  6:49 AM EST ----- Hey, please let him know that his cholesterol is very high and I am starting him a very low dose medicine, I am also sending an order to recheck his cholesterol before his next follow up, needs to be fasting

## 2020-09-27 NOTE — Telephone Encounter (Signed)
Spoke to pt and informed him of his labs and that medication was sent to pharmacy for him to start for cholesterol.  Pt advised he wasn't happy about it but will take the medication.  Also informed pt to have fasting lab drawn before next office visit

## 2020-11-06 ENCOUNTER — Ambulatory Visit: Payer: Medicare Other | Admitting: Dermatology

## 2020-11-07 ENCOUNTER — Ambulatory Visit: Payer: Medicare HMO | Admitting: Nurse Practitioner

## 2020-12-21 ENCOUNTER — Encounter: Payer: Self-pay | Admitting: Hospice and Palliative Medicine

## 2020-12-21 ENCOUNTER — Ambulatory Visit (INDEPENDENT_AMBULATORY_CARE_PROVIDER_SITE_OTHER): Payer: Medicare HMO | Admitting: Hospice and Palliative Medicine

## 2020-12-21 VITALS — BP 131/77 | HR 75 | Temp 97.4°F | Resp 16 | Ht 71.0 in | Wt 201.8 lb

## 2020-12-21 DIAGNOSIS — I700 Atherosclerosis of aorta: Secondary | ICD-10-CM

## 2020-12-21 DIAGNOSIS — I10 Essential (primary) hypertension: Secondary | ICD-10-CM | POA: Diagnosis not present

## 2020-12-21 DIAGNOSIS — G8929 Other chronic pain: Secondary | ICD-10-CM

## 2020-12-21 DIAGNOSIS — N281 Cyst of kidney, acquired: Secondary | ICD-10-CM | POA: Diagnosis not present

## 2020-12-21 DIAGNOSIS — M25562 Pain in left knee: Secondary | ICD-10-CM

## 2020-12-21 DIAGNOSIS — R911 Solitary pulmonary nodule: Secondary | ICD-10-CM

## 2020-12-21 DIAGNOSIS — M170 Bilateral primary osteoarthritis of knee: Secondary | ICD-10-CM

## 2020-12-21 MED ORDER — TRAMADOL HCL 50 MG PO TABS: ORAL_TABLET | ORAL | 2 refills | Status: DC

## 2020-12-21 NOTE — Progress Notes (Signed)
[facility] [place], [place] [address]  Internal MEDICINE  Office Visit Note  Patient Name: Terry Harvey  [id number]  [id number]  Date of Service: 12/25/2020  Chief Complaint  Patient presents with  . Follow-up  . Hyperlipidemia  . Hypertension  . Knee Pain    Still bothering him needs A REFERRAL  . Medication Refill  . 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 bisoprolol (ZEBETA) 5 MG tablet Take 1 tablet (5 mg total) by mouth daily.  . hydrochlorothiazide (HYDRODIURIL) 12.5 MG tablet Take 1 tablet by mouth everyday  . Misc Natural Products (OSTEO BI-FLEX JOINT SHIELD PO) Take 2 tablets by mouth daily.   . Multiple Vitamin (ONE-A-DAY MENS PO) Take 1 tablet by mouth daily.   . naproxen sodium (ANAPROX) 220 MG tablet Take 220 mg by mouth 2 (two) times daily.   . rosuvastatin (CRESTOR) 5 MG tablet Take 1 tablet (5 mg total) by mouth daily.   . traMADol (ULTRAM) 50 MG tablet Take one tab po tid for knee OA  . [DISCONTINUED] traMADol (ULTRAM) 50 MG tablet Take one tab po tid for knee OA   No facility-administered encounter medications on file as of 12/21/2020.    Surgical History: Past Surgical History:  Procedure Laterality Date  . APPENDECTOMY  2009  . INGUINAL HERNIA REPAIR Right 11/12/2018   Procedure: HERNIA REPAIR INGUINAL ADULT;  Surgeon: Olean Ree, MD;  Location: [facility] ORS;  Service: General;  Laterality: Right;  . TUMOR REMOVAL  2002   from right side of neck    Medical History: Past Medical History:  Diagnosis Date  . Hyperlipidemia   . Hypertension   . Insomnia   . Osteoarthritis     Family History: Family History  Problem Relation Age of Onset  . Diabetes Mellitus II Mother   . Hypertension Father     Social History   Socioeconomic History  . Marital status: Married    Spouse name: Not on file  . Number of children: 3  . Years of education: Not on file  . Highest education level: Not on file  Occupational History  . Not on file  Tobacco Use  . Smoking status: Former Smoker    Start date: 11/19/2011  . Smokeless tobacco: Never Used  Vaping Use  . Vaping Use: Never used  Substance and Sexual Activity  . Alcohol use: No    Alcohol/week: 0.0 standard drinks  .  Drug use: No  . Sexual activity: Not on file  Other Topics Concern  . Not on file  Social History Narrative  . Not on file   Social Determinants of Health   Financial Resource Strain: Not on file  Food Insecurity: Not on file  Transportation Needs: Not on file  Physical Activity: Not on file  Stress: Not on file  Social Connections: Not on file  Intimate Partner Violence: Not on file      Review of Systems  Constitutional: Negative for chills, fatigue and unexpected weight change.  HENT: Negative for congestion, postnasal drip, rhinorrhea, sneezing and sore throat.   Eyes: Negative for redness.  Respiratory: Negative  for cough, chest tightness and shortness of breath.   Cardiovascular: Negative for chest pain and palpitations.  Gastrointestinal: Negative for abdominal pain, constipation, diarrhea, nausea and vomiting.  Genitourinary: Negative for dysuria and frequency.  Musculoskeletal: Negative for arthralgias, back pain, joint swelling and neck pain.       Left knee pain  Skin: Negative for rash.  Neurological: Negative for tremors and numbness.  Hematological: Negative for adenopathy. Does not bruise/bleed easily.  Psychiatric/Behavioral: Negative for behavioral problems (Depression), sleep disturbance and suicidal ideas. The patient is not nervous/anxious.     Vital Signs: BP 131/77   Pulse 75   Temp (!) 97.4 F (36.3 C)   Resp 16   Ht 5\' 11"  (1.803 m)   Wt 201 lb 12.8 oz (91.5 kg)   SpO2 96%   BMI 28.15 kg/m    Physical Exam Vitals reviewed.  Constitutional:      Appearance: Normal appearance. He is obese.  Cardiovascular:     Rate and Rhythm: Normal rate and regular rhythm.     Pulses: Normal pulses.     Heart sounds: Normal heart sounds.  Pulmonary:     Effort: Pulmonary effort is normal.     Breath sounds: Normal breath sounds.  Abdominal:     General: Abdomen is flat.  Musculoskeletal:        General: Normal range of motion.     Cervical back: Normal range of motion.  Skin:    General: Skin is warm.  Neurological:     General: No focal deficit present.     Mental Status: He is alert and oriented to person, place, and time. Mental status is at baseline.  Psychiatric:        Mood and Affect: Mood normal.        Behavior: Behavior normal.        Thought Content: Thought content normal.        Judgment: Judgment normal.    Assessment/Plan: 1. Chronic pain of left knee Referral to ortho for worsening chronic pain Tramadol refilled while awaiting referral - Ambulatory referral to Orthopedic Surgery - traMADol (ULTRAM) 50 MG tablet; Take one tab po tid for knee OA   Dispense: 90 tablet; Refill: 2  2. Aortic atherosclerosis ([place]) Continue with Crestor Will need further vascular studies--deferred at this time due to wife's condition  3. Essential hypertension BP and HR well controlled today--continue monitoring  4. Acquired bilateral renal cysts Will need renal US for further evaluation Deferred at this time due to wife's condition  5. Nodule of left lung Consider chest CT for further evaluation Deferred due to wife's condition  General Counseling: Teion verbalizes understanding of the findings of todays visit and agrees with plan of treatment. I have discussed any further diagnostic evaluation that may be needed or  ordered today. We also reviewed his medications today. he has been encouraged to call the office with any questions or concerns that should arise related to todays visit.    Orders Placed This Encounter  Procedures  . Ambulatory referral to Orthopedic Surgery    Meds ordered this encounter  Medications  . traMADol (ULTRAM) 50 MG tablet    Sig: Take one tab po tid for knee OA    Dispense:  90 tablet    Refill:  2    Time spent: 30 Minutes Time spent includes review of chart, medications, test results and follow-up plan with the patient.  This patient was seen by Theodoro Grist AGNP-C in Collaboration with Dr Lavera Guise as a part of collaborative care agreement     Tanna Furry. Delwin Raczkowski AGNP-C Internal medicine

## 2020-12-25 ENCOUNTER — Encounter: Payer: Self-pay | Admitting: Hospice and Palliative Medicine

## 2021-01-10 DIAGNOSIS — M170 Bilateral primary osteoarthritis of knee: Secondary | ICD-10-CM | POA: Diagnosis not present

## 2021-01-10 DIAGNOSIS — M25561 Pain in right knee: Secondary | ICD-10-CM | POA: Diagnosis not present

## 2021-01-10 DIAGNOSIS — M25562 Pain in left knee: Secondary | ICD-10-CM | POA: Diagnosis not present

## 2021-01-15 ENCOUNTER — Other Ambulatory Visit: Payer: Self-pay | Admitting: Nurse Practitioner

## 2021-01-15 DIAGNOSIS — I10 Essential (primary) hypertension: Secondary | ICD-10-CM

## 2021-03-11 DIAGNOSIS — Z8249 Family history of ischemic heart disease and other diseases of the circulatory system: Secondary | ICD-10-CM | POA: Diagnosis not present

## 2021-03-11 DIAGNOSIS — E785 Hyperlipidemia, unspecified: Secondary | ICD-10-CM | POA: Diagnosis not present

## 2021-03-11 DIAGNOSIS — G8929 Other chronic pain: Secondary | ICD-10-CM | POA: Diagnosis not present

## 2021-03-11 DIAGNOSIS — I10 Essential (primary) hypertension: Secondary | ICD-10-CM | POA: Diagnosis not present

## 2021-03-11 DIAGNOSIS — M1990 Unspecified osteoarthritis, unspecified site: Secondary | ICD-10-CM | POA: Diagnosis not present

## 2021-03-11 DIAGNOSIS — Z79891 Long term (current) use of opiate analgesic: Secondary | ICD-10-CM | POA: Diagnosis not present

## 2021-03-11 DIAGNOSIS — Z87891 Personal history of nicotine dependence: Secondary | ICD-10-CM | POA: Diagnosis not present

## 2021-03-11 DIAGNOSIS — Z008 Encounter for other general examination: Secondary | ICD-10-CM | POA: Diagnosis not present

## 2021-03-22 ENCOUNTER — Other Ambulatory Visit: Payer: Self-pay | Admitting: Nurse Practitioner

## 2021-03-22 DIAGNOSIS — I10 Essential (primary) hypertension: Secondary | ICD-10-CM

## 2021-03-27 ENCOUNTER — Other Ambulatory Visit: Payer: Self-pay

## 2021-03-27 ENCOUNTER — Encounter: Payer: Self-pay | Admitting: Nurse Practitioner

## 2021-03-27 ENCOUNTER — Ambulatory Visit (INDEPENDENT_AMBULATORY_CARE_PROVIDER_SITE_OTHER): Payer: Medicare HMO | Admitting: Nurse Practitioner

## 2021-03-27 DIAGNOSIS — I10 Essential (primary) hypertension: Secondary | ICD-10-CM | POA: Diagnosis not present

## 2021-03-27 DIAGNOSIS — I700 Atherosclerosis of aorta: Secondary | ICD-10-CM

## 2021-03-27 DIAGNOSIS — M25562 Pain in left knee: Secondary | ICD-10-CM

## 2021-03-27 DIAGNOSIS — G8929 Other chronic pain: Secondary | ICD-10-CM | POA: Diagnosis not present

## 2021-03-27 MED ORDER — BISOPROLOL FUMARATE 5 MG PO TABS: 5.0000 mg | ORAL_TABLET | Freq: Every day | ORAL | 1 refills | Status: DC

## 2021-03-27 MED ORDER — HYDROCHLOROTHIAZIDE 12.5 MG PO TABS: ORAL_TABLET | ORAL | 0 refills | Status: DC

## 2021-03-27 MED ORDER — AMLODIPINE BESYLATE 5 MG PO TABS: 5.0000 mg | ORAL_TABLET | Freq: Every day | ORAL | 0 refills | Status: DC

## 2021-03-27 NOTE — Progress Notes (Signed)
[facility] [place], [place] [address]  Internal MEDICINE  Office Visit Note  Patient Name: Terry Harvey  [id number]  [id number]  Date of Service: 03/30/2021  Chief Complaint  Patient presents with  . Follow-up    Med refills   . Arthritis  . Hyperlipidemia  . Hypertension    HPI Pt is here for routine follow up BP is elevated today, takes 3 different BP meds, did run out few days ago  He was started on Crestor for DX of atherosclerosis and borderline lipid profile, does have elevated TG     Current Medication: Outpatient Encounter Medications as of 03/27/2021  Medication Sig  . amLODipine (NORVASC) 5 MG tablet Take 1 tablet (5 mg total) by mouth daily.  . Misc Natural Products (OSTEO BI-FLEX JOINT SHIELD PO) Take 2 tablets by mouth daily.   . Multiple Vitamin (ONE-A-DAY MENS PO) Take 1 tablet by mouth daily.   . naproxen sodium (ANAPROX) 220 MG tablet Take 220 mg by mouth 2 (two) times daily.   . rosuvastatin (CRESTOR) 5 MG tablet Take 1 tablet (5 mg total) by mouth daily.  . [DISCONTINUED] bisoprolol (ZEBETA) 5 MG tablet Take 1 tablet (5 mg total) by mouth daily.  . [DISCONTINUED] hydrochlorothiazide (HYDRODIURIL) 12.5 MG tablet TAKE 1/2 TABLET BY MOUTH EVERYDAY  . bisoprolol (ZEBETA) 5 MG tablet Take 1 tablet (5 mg total) by mouth daily.  . hydrochlorothiazide (HYDRODIURIL) 12.5 MG tablet TAKE 1 TABLET BY MOUTH EVERYDAY  . traMADol (ULTRAM) 50 MG tablet Take one tab po tid for knee OA  . [DISCONTINUED] traMADol (ULTRAM) 50 MG tablet Take one tab po tid for knee OA   No facility-administered encounter medications on file as of 03/27/2021.    Surgical History: Past Surgical History:  Procedure Laterality Date  . APPENDECTOMY  2009  . INGUINAL HERNIA REPAIR Right 11/12/2018   Procedure: HERNIA REPAIR INGUINAL ADULT;  Surgeon: Olean Ree, MD;  Location: [facility] ORS;  Service: General;  Laterality: Right;  . TUMOR REMOVAL  2002   from  right side of neck    Medical History: Past Medical History:  Diagnosis Date  . Hyperlipidemia   . Hypertension   . Insomnia   . Osteoarthritis     Family History: Family History  Problem Relation Age of Onset  . Diabetes Mellitus II Mother   . Hypertension Father     Social History   Socioeconomic History  . Marital status: Married    Spouse name: Not on file  . Number of children: 3  . Years of education: Not on file  . Highest education level: Not on file  Occupational History  . Not on file  Tobacco Use  . Smoking status: Former Smoker    Types: Cigarettes    Quit date: 11/19/2011    Years since quitting: 9.3  . Smokeless tobacco: Never Used  Vaping Use  . Vaping Use: Never used  Substance and Sexual Activity  . Alcohol use: No    Alcohol/week: 0.0 standard drinks  . Drug use: No  . Sexual activity: Not on file  Other Topics Concern  . Not on file  Social History Narrative  . Not on file   Social Determinants of Health   Financial Resource Strain: Not on file  Food Insecurity: Not on file  Transportation Needs: Not on file  Physical Activity: Not on file  Stress: Not on file  Social Connections: Not on file  Intimate Partner Violence:  Not on file      Review of Systems  Constitutional: Negative.   HENT: Negative.   Respiratory: Negative.   Cardiovascular: Negative.     Vital Signs: BP (!) 168/115   Pulse 95   Temp 98 F (36.7 C)   Resp 16   Ht 5\' 11"  (1.803 m)   Wt 191 lb 12.8 oz (87 kg)   SpO2 94%   BMI 26.75 kg/m    Physical Exam Constitutional:      Appearance: Normal appearance. He is normal weight.  HENT:     Head: Normocephalic and atraumatic.  Cardiovascular:     Rate and Rhythm: Normal rate and regular rhythm.     Pulses: Normal pulses.     Heart sounds: Normal heart sounds.  Pulmonary:     Effort: Pulmonary effort is normal.     Breath sounds: Normal breath sounds.  Skin:    General: Skin is warm and dry.   Neurological:     General: No focal deficit present.     Mental Status: He is alert and oriented to person, place, and time.        Assessment/Plan:  1. Hypertension, unspecified type BP elevated significantly this visit. Manual BP recheck 160/95. Patient ran out of blood pressure medications and needs them refilled. Bisoprolol, hydrochlorothiazide and amlodipine prescriptions ordered. - bisoprolol (ZEBETA) 5 MG tablet; Take 1 tablet (5 mg total) by mouth daily.  Dispense: 90 tablet; Refill: 1 - hydrochlorothiazide (HYDRODIURIL) 12.5 MG tablet; TAKE 1 TABLET BY MOUTH EVERYDAY  Dispense: 90 tablet; Refill: 0 - amLODipine (NORVASC) 5 MG tablet; Take 1 tablet (5 mg total) by mouth daily.  Dispense: 30 tablet; Refill: 0  2. Chronic pain of left knee Patient has chronic pain in the left knee and takes tramadol for this. Refill ordered - traMADol (ULTRAM) 50 MG tablet; Take one tab po tid for knee OA  Dispense: 90 tablet; Refill: 2  3. Aortic atherosclerosis ([facility]) Continue Crestor as before   General Counseling: Terry Harvey verbalizes understanding of the findings of todays visit and agrees with plan of treatment. I have discussed any further diagnostic evaluation that may be needed or ordered today. We also reviewed his medications today. he has been encouraged to call the office with any questions or concerns that should arise related to todays visit.    No orders of the defined types were placed in this encounter.   Meds ordered this encounter  Medications  . bisoprolol (ZEBETA) 5 MG tablet    Sig: Take 1 tablet (5 mg total) by mouth daily.    Dispense:  90 tablet    Refill:  1  . hydrochlorothiazide (HYDRODIURIL) 12.5 MG tablet    Sig: TAKE 1 TABLET BY MOUTH EVERYDAY    Dispense:  90 tablet    Refill:  0  . amLODipine (NORVASC) 5 MG tablet    Sig: Take 1 tablet (5 mg total) by mouth daily.    Dispense:  30 tablet    Refill:  0  . traMADol (ULTRAM) 50 MG tablet    Sig: Take  one tab po tid for knee OA    Dispense:  90 tablet    Refill:  2   Return in about 1 month (around 04/27/2021) for F/U, med refill, BP check, Ociel Harvey PCP.  Total time spent: 20 Minutes Time spent includes review of chart, medications, test results, and follow up plan with the patient.   [place] Controlled Substance Database was reviewed by  me.   Dr Lavera Guise Internal medicine

## 2021-03-29 MED ORDER — TRAMADOL HCL 50 MG PO TABS: ORAL_TABLET | ORAL | 2 refills | Status: DC

## 2021-04-20 ENCOUNTER — Other Ambulatory Visit: Payer: Self-pay | Admitting: Internal Medicine

## 2021-04-20 DIAGNOSIS — I10 Essential (primary) hypertension: Secondary | ICD-10-CM

## 2021-04-27 ENCOUNTER — Ambulatory Visit: Payer: Medicare HMO | Admitting: Nurse Practitioner

## 2021-04-29 ENCOUNTER — Other Ambulatory Visit: Payer: Self-pay | Admitting: Internal Medicine

## 2021-04-29 DIAGNOSIS — I10 Essential (primary) hypertension: Secondary | ICD-10-CM

## 2021-05-03 ENCOUNTER — Ambulatory Visit (INDEPENDENT_AMBULATORY_CARE_PROVIDER_SITE_OTHER): Payer: Medicare HMO | Admitting: Nurse Practitioner

## 2021-05-03 ENCOUNTER — Other Ambulatory Visit: Payer: Self-pay

## 2021-05-03 ENCOUNTER — Encounter: Payer: Self-pay | Admitting: Nurse Practitioner

## 2021-05-03 VITALS — BP 142/88 | HR 70 | Temp 97.8°F | Resp 16 | Ht 71.0 in | Wt 190.4 lb

## 2021-05-03 DIAGNOSIS — G8929 Other chronic pain: Secondary | ICD-10-CM

## 2021-05-03 DIAGNOSIS — R911 Solitary pulmonary nodule: Secondary | ICD-10-CM | POA: Diagnosis not present

## 2021-05-03 DIAGNOSIS — M25562 Pain in left knee: Secondary | ICD-10-CM | POA: Diagnosis not present

## 2021-05-03 DIAGNOSIS — I10 Essential (primary) hypertension: Secondary | ICD-10-CM

## 2021-05-03 DIAGNOSIS — I700 Atherosclerosis of aorta: Secondary | ICD-10-CM | POA: Diagnosis not present

## 2021-05-03 MED ORDER — TRAMADOL HCL 50 MG PO TABS: ORAL_TABLET | ORAL | 2 refills | Status: DC

## 2021-05-03 NOTE — Progress Notes (Signed)
[facility] [place], [place] [address]  Internal MEDICINE  Office Visit Note  Patient Name: Terry Harvey  [id number]  [id number]  Date of Service: 05/10/2021  Chief Complaint  Patient presents with   Follow-up   Hyperlipidemia   Hypertension   Quality Metric Gaps    Shingrix, covid booster    HPI Terry Harvey presents for a follow up visit  for hypertension. At his previous office visit on may 10th, he had rum out of his blood pressure medications. Those prescriptions were refilled and none of the doses were changed. Since he has had his blood pressure medications reordered his blood pressure has been much more controlled.    Current Medication: Outpatient Encounter Medications as of 05/03/2021  Medication Sig   amLODipine (NORVASC) 5 MG tablet TAKE 1 TABLET (5 MG TOTAL) BY MOUTH DAILY.   bisoprolol (ZEBETA) 5 MG tablet Take 1 tablet (5 mg total) by mouth daily.   hydrochlorothiazide (HYDRODIURIL) 12.5 MG tablet TAKE 1 TABLET BY MOUTH EVERYDAY   Misc Natural Products (OSTEO BI-FLEX JOINT SHIELD PO) Take 2 tablets by mouth daily.    Multiple Vitamin (ONE-A-DAY MENS PO) Take 1 tablet by mouth daily.    naproxen sodium (ANAPROX) 220 MG tablet Take 220 mg by mouth 2 (two) times daily.    rosuvastatin (CRESTOR) 5 MG tablet Take 1 tablet (5 mg total) by mouth daily.   [DISCONTINUED] traMADol (ULTRAM) 50 MG tablet Take one tab po tid for knee OA   traMADol (ULTRAM) 50 MG tablet Take one tab po tid for knee OA   No facility-administered encounter medications on file as of 05/03/2021.    Surgical History: Past Surgical History:  Procedure Laterality Date   APPENDECTOMY  2009   INGUINAL HERNIA REPAIR Right 11/12/2018   Procedure: HERNIA REPAIR INGUINAL ADULT;  Surgeon: Olean Ree, MD;  Location: [facility] ORS;  Service: General;  Laterality: Right;   TUMOR REMOVAL  2002   from right side of neck    Medical History: Past Medical History:  Diagnosis Date    Hyperlipidemia    Hypertension    Insomnia    Osteoarthritis     Family History: Family History  Problem Relation Age of Onset   Diabetes Mellitus II Mother    Hypertension Father     Social History   Socioeconomic History   Marital status: Widowed    Spouse name: Not on file   Number of children: 3   Years of education: Not on file   Highest education level: Not on file  Occupational History   Not on file  Tobacco Use   Smoking status: Former    Pack years: 0.00    Types: Cigarettes    Quit date: 11/19/2011    Years since quitting: 9.4   Smokeless tobacco: Never  Vaping Use   Vaping Use: Never used  Substance and Sexual Activity   Alcohol use: No    Alcohol/week: 0.0 standard drinks   Drug use: No   Sexual activity: Not on file  Other Topics Concern   Not on file  Social History Narrative   Not on file   Social Determinants of Health   Financial Resource Strain: Not on file  Food Insecurity: Not on file  Transportation Needs: Not on file  Physical Activity: Not on file  Stress: Not on file  Social Connections: Not on file  Intimate Partner Violence: Not on file      Review of Systems  Constitutional:  Negative for chills, fatigue and unexpected weight change.  HENT:  Negative for congestion, rhinorrhea, sneezing and sore throat.   Eyes:  Negative for redness.  Respiratory:  Negative for cough, chest tightness and shortness of breath.   Cardiovascular:  Negative for chest pain and palpitations.  Gastrointestinal:  Negative for abdominal pain, constipation, diarrhea, nausea and vomiting.  Genitourinary:  Negative for dysuria and frequency.  Musculoskeletal:  Negative for arthralgias, back pain, joint swelling and neck pain.  Skin:  Negative for rash.  Neurological: Negative.  Negative for tremors and numbness.  Hematological:  Negative for adenopathy. Does not bruise/bleed easily.  Psychiatric/Behavioral:  Negative for behavioral problems  (Depression), sleep disturbance and suicidal ideas. The patient is not nervous/anxious.    Vital Signs: BP (!) 142/88   Pulse 70   Temp 97.8 F (36.6 C)   Resp 16   Ht 5\' 11"  (1.803 m)   Wt 190 lb 6.4 oz (86.4 kg)   SpO2 98%   BMI 26.56 kg/m    Physical Exam Vitals reviewed.  Constitutional:      General: He is not in acute distress.    Appearance: Normal appearance. He is well-developed. He is not ill-appearing or diaphoretic.  HENT:     Head: Normocephalic and atraumatic.  Neck:     Thyroid: No thyromegaly.     Vascular: No JVD.     Trachea: No tracheal deviation.  Cardiovascular:     Rate and Rhythm: Normal rate and regular rhythm.     Pulses: Normal pulses.     Heart sounds: Normal heart sounds. No murmur heard.   No friction rub. No gallop.  Pulmonary:     Effort: Pulmonary effort is normal. No respiratory distress.     Breath sounds: Normal breath sounds. No wheezing or rales.  Chest:     Chest wall: No tenderness.  Musculoskeletal:        General: Normal range of motion.  Skin:    General: Skin is warm and dry.     Capillary Refill: Capillary refill takes less than 2 seconds.  Neurological:     Mental Status: He is alert and oriented to person, place, and time.  Psychiatric:        Mood and Affect: Mood normal.        Behavior: Behavior normal.    Assessment/Plan: 1. Essential hypertension Blood pressure is stable now that patient was able to obtain all of his antihypertensive medication prescriptions. Continue medications as prescribed.   2. Nodule of left lung A CT scan of the abdomen identified a possible 85mm nodule in the base of the left lung in 2019. This has not been followed up with any imaging since then. He is a former smoker and quit in 2013. CT chest ordered  to assess for progression. - CT CHEST LUNG CA SCREEN LOW DOSE W/O CM; Future  3. Chronic pain of left knee History of significant osteoarthritis to the left knee, tramadol prescription  reordered.  - traMADol (ULTRAM) 50 MG tablet; Take one tab po tid for knee OA  Dispense: 90 tablet; Refill: 2  4. Aortic atherosclerosis ([place]) Patient is current taking rosuvastatin 5 mg daily. Will discuss ordering a bilateral carotid ultrasound at next follow up visit in September to assess for stenosis.    General Counseling: Terry Harvey verbalizes understanding of the findings of todays visit and agrees with plan of treatment. I have discussed any further diagnostic evaluation that may be needed or ordered  today. We also reviewed his medications today. he has been encouraged to call the office with any questions or concerns that should arise related to todays visit.    No orders of the defined types were placed in this encounter.   Meds ordered this encounter  Medications   traMADol (ULTRAM) 50 MG tablet    Sig: Take one tab po tid for knee OA    Dispense:  90 tablet    Refill:  2    Order Specific Question:   Supervising Provider    Answer:   Lavera Guise [[id number]]    Return in about 3 months (around 08/03/2021) for F/U, BP check, Jonalyn Sedlak PCP.   Total time spent:30 Minutes Time spent includes review of chart, medications, test results, and follow up plan with the patient.   [place] Controlled Substance Database was reviewed by me.  This patient was seen by Jonetta Osgood, FNP-C in collaboration with Dr. Clayborn Bigness as a part of collaborative care agreement.   Nathanial Arrighi R. Valetta Fuller, MSN, FNP-C Internal medicine

## 2021-05-16 DIAGNOSIS — M1712 Unilateral primary osteoarthritis, left knee: Secondary | ICD-10-CM | POA: Diagnosis not present

## 2021-05-24 DIAGNOSIS — M1712 Unilateral primary osteoarthritis, left knee: Secondary | ICD-10-CM | POA: Diagnosis not present

## 2021-05-30 DIAGNOSIS — M1712 Unilateral primary osteoarthritis, left knee: Secondary | ICD-10-CM | POA: Diagnosis not present

## 2021-06-17 ENCOUNTER — Other Ambulatory Visit: Payer: Self-pay | Admitting: Internal Medicine

## 2021-06-17 DIAGNOSIS — I10 Essential (primary) hypertension: Secondary | ICD-10-CM

## 2021-06-23 ENCOUNTER — Other Ambulatory Visit: Payer: Self-pay | Admitting: Internal Medicine

## 2021-06-23 DIAGNOSIS — I10 Essential (primary) hypertension: Secondary | ICD-10-CM

## 2021-06-29 ENCOUNTER — Other Ambulatory Visit: Payer: Self-pay | Admitting: Internal Medicine

## 2021-06-29 DIAGNOSIS — E782 Mixed hyperlipidemia: Secondary | ICD-10-CM

## 2021-07-26 ENCOUNTER — Telehealth: Payer: Self-pay | Admitting: Internal Medicine

## 2021-07-26 NOTE — Progress Notes (Signed)
  Chronic Care Management   Outreach Note  07/26/2021 Name: Terry Harvey. MRN: [id number] DOB: 04/29/1951  Referred by: Lavera Guise, MD Reason for referral : No chief complaint on file.   An unsuccessful telephone outreach was attempted today. The patient was referred to the pharmacist for assistance with care management and care coordination.   Follow Up Plan:   Tatjana Dellinger Upstream Scheduler

## 2021-08-02 ENCOUNTER — Telehealth: Payer: Self-pay | Admitting: Internal Medicine

## 2021-08-02 NOTE — Progress Notes (Signed)
  Chronic Care Management   Outreach Note  08/02/2021 Name: Terry Harvey. MRN: [id number] DOB: [birth year]  Referred by: Lavera Guise, MD Reason for referral : No chief complaint on file.   A second unsuccessful telephone outreach was attempted today. The patient was referred to pharmacist for assistance with care management and care coordination.  Follow Up Plan:   Tatjana Dellinger Upstream Scheduler

## 2021-08-06 ENCOUNTER — Encounter: Payer: Self-pay | Admitting: Nurse Practitioner

## 2021-08-06 ENCOUNTER — Ambulatory Visit (INDEPENDENT_AMBULATORY_CARE_PROVIDER_SITE_OTHER): Payer: Medicare HMO | Admitting: Nurse Practitioner

## 2021-08-06 ENCOUNTER — Telehealth: Payer: Self-pay | Admitting: Internal Medicine

## 2021-08-06 ENCOUNTER — Other Ambulatory Visit: Payer: Self-pay

## 2021-08-06 VITALS — BP 140/82 | HR 73 | Temp 98.8°F | Resp 16 | Ht 71.0 in | Wt 194.2 lb

## 2021-08-06 DIAGNOSIS — M79604 Pain in right leg: Secondary | ICD-10-CM | POA: Diagnosis not present

## 2021-08-06 DIAGNOSIS — G8929 Other chronic pain: Secondary | ICD-10-CM

## 2021-08-06 DIAGNOSIS — M79606 Pain in leg, unspecified: Secondary | ICD-10-CM

## 2021-08-06 DIAGNOSIS — E559 Vitamin D deficiency, unspecified: Secondary | ICD-10-CM | POA: Diagnosis not present

## 2021-08-06 DIAGNOSIS — M25562 Pain in left knee: Secondary | ICD-10-CM | POA: Diagnosis not present

## 2021-08-06 DIAGNOSIS — Z23 Encounter for immunization: Secondary | ICD-10-CM

## 2021-08-06 DIAGNOSIS — M79605 Pain in left leg: Secondary | ICD-10-CM

## 2021-08-06 DIAGNOSIS — I10 Essential (primary) hypertension: Secondary | ICD-10-CM | POA: Diagnosis not present

## 2021-08-06 DIAGNOSIS — I700 Atherosclerosis of aorta: Secondary | ICD-10-CM | POA: Diagnosis not present

## 2021-08-06 DIAGNOSIS — Z125 Encounter for screening for malignant neoplasm of prostate: Secondary | ICD-10-CM

## 2021-08-06 MED ORDER — GABAPENTIN 300 MG PO CAPS
ORAL_CAPSULE | ORAL | 2 refills | Status: DC
Start: 2021-08-06 — End: 2021-09-25

## 2021-08-06 MED ORDER — TRAMADOL HCL 50 MG PO TABS: ORAL_TABLET | ORAL | 2 refills | Status: DC

## 2021-08-06 NOTE — Progress Notes (Signed)
  Chronic Care Management   Note  08/06/2021 Name: Taelor Fragoza. MRN: [id number] DOB: [birth year]  Ledora Bottcher. is a 70 y.o. year old male who is a primary care patient of Lavera Guise, MD. I reached out to [facility]. by phone today in response to a referral sent by Mr. TEVAN IRWIN Jr.'s PCP, Lavera Guise, MD.   Mr. Baggarly was given information about Chronic Care Management services today including:  CCM service includes personalized support from designated clinical staff supervised by his physician, including individualized plan of care and coordination with other care providers 24/7 contact phone numbers for assistance for urgent and routine care needs. Service will only be billed when office clinical staff spend 20 minutes or more in a month to coordinate care. Only one practitioner may furnish and bill the service in a calendar month. The patient may stop CCM services at any time (effective at the end of the month) by phone call to the office staff.   Patient agreed to services and verbal consent obtained.   Follow up plan:   Tatjana Secretary/administrator

## 2021-08-06 NOTE — Progress Notes (Signed)
[facility] [place],  [address]  Internal MEDICINE  Office Visit Note  Patient Name: Terry Harvey  [id number]  [id number]  Date of Service: 08/06/2021  Chief Complaint  Patient presents with   Follow-up    Wakes up in the middle of the night with lower leg pain, sometimes will take Tylenol to ease pain so he can sleep, tenderness under jaw area both sides, discuss shots for left knee    Hyperlipidemia   Hypertension    HPI Eathon presents for a follow up visit for hypertension. He has also been experiencing leg pain/cramping in the middle of the night that wakes him up. Sometimes, he takes tylenol which helps. Wants steroid injection in left knee. Blood pressure was elevated today but improved when rechecked, see vitals. He is scheduled for his annual wellness visit in November.  130/80 at home    Current Medication: Outpatient Encounter Medications as of 08/06/2021  Medication Sig   amLODipine (NORVASC) 5 MG tablet TAKE 1 TABLET (5 MG TOTAL) BY MOUTH DAILY.   bisoprolol (ZEBETA) 5 MG tablet Take 1 tablet (5 mg total) by mouth daily.   gabapentin (NEURONTIN) 300 MG capsule Take one tab po qhs for spasm   hydrochlorothiazide (HYDRODIURIL) 12.5 MG tablet TAKE 1 TABLET BY MOUTH EVERY DAY   Misc Natural Products (OSTEO BI-FLEX JOINT SHIELD PO) Take 2 tablets by mouth daily.    Multiple Vitamin (ONE-A-DAY MENS PO) Take 1 tablet by mouth daily.    naproxen sodium (ANAPROX) 220 MG tablet Take 220 mg by mouth 2 (two) times daily.    rosuvastatin (CRESTOR) 5 MG tablet TAKE 1 TABLET BY MOUTH EVERY DAY   [DISCONTINUED] traMADol (ULTRAM) 50 MG tablet Take one tab po tid for knee OA   traMADol (ULTRAM) 50 MG tablet Take one tab po tid for knee OA   No facility-administered encounter medications on file as of 08/06/2021.    Surgical History: Past Surgical History:  Procedure Laterality Date   APPENDECTOMY  2009   INGUINAL HERNIA REPAIR Right  11/12/2018   Procedure: HERNIA REPAIR INGUINAL ADULT;  Surgeon: Olean Ree, MD;  Location: [facility] ORS;  Service: General;  Laterality: Right;   TUMOR REMOVAL  2002   from right side of neck    Medical History: Past Medical History:  Diagnosis Date   Hyperlipidemia    Hypertension    Insomnia    Osteoarthritis     Family History: Family History  Problem Relation Age of Onset   Diabetes Mellitus II Mother    Hypertension Father     Social History   Socioeconomic History   Marital status: Widowed    Spouse name: Not on file   Number of children: 3   Years of education: Not on file   Highest education level: Not on file  Occupational History   Not on file  Tobacco Use   Smoking status: Former    Types: Cigarettes    Quit date: 11/19/2011    Years since quitting: 9.7   Smokeless tobacco: Never  Vaping Use   Vaping Use: Never used  Substance and Sexual Activity   Alcohol use: No    Alcohol/week: 0.0 standard drinks   Drug use: No   Sexual activity: Not on file  Other Topics Concern   Not on file  Social History Narrative   Not on file   Social Determinants of Health   Financial Resource Strain: Not on file  Food  Insecurity: Not on file  Transportation Needs: Not on file  Physical Activity: Not on file  Stress: Not on file  Social Connections: Not on file  Intimate Partner Violence: Not on file      Review of Systems  Constitutional:  Negative for chills, fatigue and unexpected weight change.  HENT:  Negative for congestion, rhinorrhea, sneezing and sore throat.   Eyes:  Negative for redness.  Respiratory:  Negative for cough, chest tightness and shortness of breath.   Cardiovascular:  Negative for chest pain and palpitations.  Gastrointestinal:  Negative for abdominal pain, constipation, diarrhea, nausea and vomiting.  Genitourinary:  Negative for dysuria and frequency.  Musculoskeletal:  Negative for arthralgias, back pain, joint swelling and neck  pain.  Skin:  Negative for rash.  Neurological: Negative.  Negative for tremors and numbness.  Hematological:  Negative for adenopathy. Does not bruise/bleed easily.  Psychiatric/Behavioral:  Negative for behavioral problems (Depression), sleep disturbance and suicidal ideas. The patient is not nervous/anxious.    Vital Signs: BP 140/82 Comment: 146/84  Pulse 73   Temp 98.8 F (37.1 C)   Resp 16   Ht $R'5\' 11"'DR$  (1.803 m)   Wt 194 lb 3.2 oz (88.1 kg)   SpO2 98%   BMI 27.09 kg/m    Physical Exam Vitals reviewed.  Constitutional:      General: He is not in acute distress.    Appearance: Normal appearance. He is not ill-appearing.  HENT:     Head: Normocephalic and atraumatic.  Eyes:     Extraocular Movements: Extraocular movements intact.     Pupils: Pupils are equal, round, and reactive to light.  Cardiovascular:     Rate and Rhythm: Normal rate and regular rhythm.  Pulmonary:     Effort: Pulmonary effort is normal. No respiratory distress.  Neurological:     Mental Status: He is alert and oriented to person, place, and time.     Cranial Nerves: No cranial nerve deficit.     Coordination: Coordination normal.     Gait: Gait normal.  Psychiatric:        Mood and Affect: Mood normal.        Behavior: Behavior normal.       Assessment/Plan: 1. Primary hypertension Blood pressure is stable on current medications, continue as prescribed.   2. Bilateral leg pain ABI done in office, results were wnl. Gabapentin prescribed to take at bedtime to help with pain and restlessness.  - POCT ABI Screening Pilot No Charge - gabapentin (NEURONTIN) 300 MG capsule; Take one tab po qhs for spasm  Dispense: 30 capsule; Refill: 2  3. Aortic atherosclerosis ([place]) Routine labs ordered.  - CBC with Differential/Platelet - CMP14+EGFR - TSH + free T4 - Lipid Profile  4. Chronic pain of left knee Tramadol refill ordered, patient is planning to make an appointment with his orthopedic  specialist for joint injection.  - traMADol (ULTRAM) 50 MG tablet; Take one tab po tid for knee OA  Dispense: 90 tablet; Refill: 2  5. Vitamin D deficiency Rule out low vitamin D - Vitamin D (25 hydroxy)  6. Screening for prostate cancer PSA level ordered.  - PSA, total and free  7. Needs flu shot Administered in office today - Flu Vaccine MDCK QUAD PF   General Counseling: Zakary verbalizes understanding of the findings of todays visit and agrees with plan of treatment. I have discussed any further diagnostic evaluation that may be needed or ordered today. We also reviewed his  medications today. he has been encouraged to call the office with any questions or concerns that should arise related to todays visit.    Orders Placed This Encounter  Procedures   Flu Vaccine MDCK QUAD PF   CBC with Differential/Platelet   CMP14+EGFR   TSH + free T4   Lipid Profile   Vitamin D (25 hydroxy)   PSA, total and free   POCT ABI Screening Pilot No Charge    Meds ordered this encounter  Medications   gabapentin (NEURONTIN) 300 MG capsule    Sig: Take one tab po qhs for spasm    Dispense:  30 capsule    Refill:  2   traMADol (ULTRAM) 50 MG tablet    Sig: Take one tab po tid for knee OA    Dispense:  90 tablet    Refill:  2    Return in about 4 weeks (around 09/03/2021) for F/U, BP check, Breeley Bischof PCP.   Total time spent:30 Minutes Time spent includes review of chart, medications, test results, and follow up plan with the patient.    Controlled Substance Database was reviewed by me.  This patient was seen by Jonetta Osgood, FNP-C in collaboration with Dr. Clayborn Bigness as a part of collaborative care agreement.   Cristen Murcia R. Valetta Fuller, MSN, FNP-C Internal medicine

## 2021-09-20 ENCOUNTER — Ambulatory Visit: Payer: Medicare HMO | Admitting: Nurse Practitioner

## 2021-09-21 ENCOUNTER — Telehealth: Payer: Self-pay

## 2021-09-21 NOTE — Telephone Encounter (Signed)
Left vm to confirm 09/25/21 appointment-Toni 

## 2021-09-24 ENCOUNTER — Telehealth: Payer: Self-pay | Admitting: Student-PharmD

## 2021-09-24 NOTE — Progress Notes (Signed)
  Chronic Care Management Pharmacy Assistant   Name: Terry Harvey.  MRN: [id number] DOB: 1951/05/02  Terry Harvey. is an 70 y.o. year old male who presents for his initial CCM visit with the clinical pharmacist.  Reason for Encounter: Chart Prep    Conditions to be addressed/monitored: HTN, HLD.  Primary concerns for visit include: HTN  Recent office visits:  08/06/21 Jonetta Osgood, NP. For follow-up. STARTED Gabapentin 300 mg 1 tab po QHS for spasm.  05/03/21 Jonetta Osgood, NP. For follow-up. No medication changes.  03/27/21 Jonetta Osgood, NP. For follow-up. STARTED Amlodipine 5 mg daily. CHANGED Hydrochlorothiazide 12.5 mg 1 tablet by mouth daily.   Recent consult visits:  05/30/21 Orthopedic Surgery Poggi, Smith Mince, MD For follow-up. No mediation changes.  05/24/21 Orthopedic Surgery Feliberto Gottron and Langston Reusing. No more information given.  05/16/21 Orthopedic Surgery Watt Climes. For Unilateral primary osteoarthritis. No medication changes.   Hospital visits:  Non in the last months  Medication History: Rosuvastatin 5 mg 09/06/21  Medications: Outpatient Encounter Medications as of 09/24/2021  Medication Sig   amLODipine (NORVASC) 5 MG tablet TAKE 1 TABLET (5 MG TOTAL) BY MOUTH DAILY.   bisoprolol (ZEBETA) 5 MG tablet Take 1 tablet (5 mg total) by mouth daily.   gabapentin (NEURONTIN) 300 MG capsule Take one tab po qhs for spasm   hydrochlorothiazide (HYDRODIURIL) 12.5 MG tablet TAKE 1 TABLET BY MOUTH EVERY DAY   Misc Natural Products (OSTEO BI-FLEX JOINT SHIELD PO) Take 2 tablets by mouth daily.    Multiple Vitamin (ONE-A-DAY MENS PO) Take 1 tablet by mouth daily.    naproxen sodium (ANAPROX) 220 MG tablet Take 220 mg by mouth 2 (two) times daily.    rosuvastatin (CRESTOR) 5 MG tablet TAKE 1 TABLET BY MOUTH EVERY DAY   traMADol (ULTRAM) 50 MG tablet Take one tab po tid for knee OA   No facility-administered encounter medications  on file as of 09/24/2021.   Have you seen any other providers since your last visit? Patient stated no.   Any changes in your medications or health? Patient stated no.   Any side effects from any medications? Patient stated no.   Do you have an symptoms or problems not managed by your medications? Patient stated his left knee pain.   Any concerns about your health right now? Patient stated no.   Has your provider asked that you check blood pressure, blood sugar, or follow special diet at home? Patient stated no.   Do you get any type of exercise on a regular basis? Patient stated he exercises daily.   Can you think of a goal you would like to reach for your health? Patient stated he like to loose more weight.   Do you have any problems getting your medications? Patient stated no.  Is there anything that you would like to discuss during the appointment? Patient stated no.  Please bring medications and supplements to appointment, patient reminded of his appointment on 09/28/21 at 930 pm.  [place], [place] Pharmacist Assistant [phone]

## 2021-09-25 ENCOUNTER — Other Ambulatory Visit: Payer: Self-pay

## 2021-09-25 ENCOUNTER — Ambulatory Visit (INDEPENDENT_AMBULATORY_CARE_PROVIDER_SITE_OTHER): Payer: Medicare HMO | Admitting: Nurse Practitioner

## 2021-09-25 ENCOUNTER — Encounter: Payer: Self-pay | Admitting: Nurse Practitioner

## 2021-09-25 VITALS — BP 135/72 | HR 72 | Temp 98.8°F | Resp 16 | Ht 71.0 in | Wt 196.4 lb

## 2021-09-25 DIAGNOSIS — M79605 Pain in left leg: Secondary | ICD-10-CM

## 2021-09-25 DIAGNOSIS — R300 Dysuria: Secondary | ICD-10-CM | POA: Diagnosis not present

## 2021-09-25 DIAGNOSIS — G8929 Other chronic pain: Secondary | ICD-10-CM

## 2021-09-25 DIAGNOSIS — I10 Essential (primary) hypertension: Secondary | ICD-10-CM

## 2021-09-25 DIAGNOSIS — M79604 Pain in right leg: Secondary | ICD-10-CM | POA: Diagnosis not present

## 2021-09-25 DIAGNOSIS — Z0001 Encounter for general adult medical examination with abnormal findings: Secondary | ICD-10-CM

## 2021-09-25 DIAGNOSIS — M25562 Pain in left knee: Secondary | ICD-10-CM | POA: Diagnosis not present

## 2021-09-25 MED ORDER — BISOPROLOL FUMARATE 5 MG PO TABS: 5.0000 mg | ORAL_TABLET | Freq: Every day | ORAL | 1 refills | Status: DC

## 2021-09-25 MED ORDER — HYDROCHLOROTHIAZIDE 12.5 MG PO TABS: ORAL_TABLET | ORAL | 1 refills | Status: DC

## 2021-09-25 MED ORDER — TRAMADOL HCL 50 MG PO TABS: ORAL_TABLET | ORAL | 2 refills | Status: DC

## 2021-09-25 MED ORDER — GABAPENTIN 300 MG PO CAPS: ORAL_CAPSULE | ORAL | 2 refills | Status: DC

## 2021-09-25 MED ORDER — AMLODIPINE BESYLATE 5 MG PO TABS: 5.0000 mg | ORAL_TABLET | Freq: Every day | ORAL | 1 refills | Status: DC

## 2021-09-25 NOTE — Progress Notes (Signed)
Chronic Care Management Pharmacy Note  09/28/2021 Name:  Terry Harvey. MRN:  [id number] DOB:  [birth year]  Summary: -Patient will complete active lab orders next week -Patient will start taking tylenol $RemoveBefore'500mg'[id number]$  1-2 tabs with his current tramadol regimen -Patient mentions tenderness in neck that comes and goes -Patient will continue medications and try to watch salt intake  Recommendations/Changes made from today's visit: -Start taking tylenol $RemoveBefore'500mg'YRgbTtcQpECZl$  1-2 tabs with tramadol -Check bp readings at home 1-2 times per week -Try to start wearing knee brace at work  Plan: -Follow-up in 03/2022   Subjective: Terry Harvey. is an 70 y.o. year old male who is a primary patient of Humphrey Rolls, Timoteo Gaul, MD.  The CCM team was consulted for assistance with disease management and care coordination needs.    Engaged with patient face to face for initial visit in response to provider referral for pharmacy case management and/or care coordination services.   Consent to Services:  The patient was given the following information about Chronic Care Management services today, agreed to services, and gave verbal consent: 1. CCM service includes personalized support from designated clinical staff supervised by the primary care provider, including individualized plan of care and coordination with other care providers 2. 24/7 contact phone numbers for assistance for urgent and routine care needs. 3. Service will only be billed when office clinical staff spend 20 minutes or more in a month to coordinate care. 4. Only one practitioner may furnish and bill the service in a calendar month. 5.The patient may stop CCM services at any time (effective at the end of the month) by phone call to the office staff. 6. The patient will be responsible for cost sharing (co-pay) of up to 20% of the service fee (after annual deductible is met). Patient agreed to services and consent obtained.  Patient Care Team: Lavera Guise, MD  as PCP - General (Internal Medicine) Alena Bills, [facility] as Pharmacist (Pharmacist)  Recent office visits: 09/25/21 Jonetta Osgood, NP. For follow-up. No medication changes 08/06/21 Jonetta Osgood, NP. For follow-up. STARTED Gabapentin 300 mg 1 tab po QHS for spasm.  05/03/21 Jonetta Osgood, NP. For follow-up. No medication changes.  03/27/21 Jonetta Osgood, NP. For follow-up. STARTED Amlodipine 5 mg daily. CHANGED Hydrochlorothiazide 12.5 mg 1 tablet by mouth daily.   Recent consult visits: 05/30/21 Orthopedic Surgery Poggi, Smith Mince, MD For follow-up. No mediation changes.  05/24/21 Orthopedic Surgery Feliberto Gottron and Langston Reusing. No more information given.  05/16/21 Orthopedic Surgery Watt Climes. For Unilateral primary osteoarthritis. No medication changes.   Hospital visits: None in previous 6 months   Objective:  Lab Results  Component Value Date   CREATININE 1.20 03/06/2020   BUN 13 03/06/2020   GFRNONAA 62 03/06/2020   GFRAA 71 03/06/2020   NA 141 03/06/2020   K 4.1 03/06/2020   CALCIUM 9.7 03/06/2020   CO2 24 03/06/2020   GLUCOSE 103 (H) 03/06/2020    No results found for: HGBA1C, FRUCTOSAMINE, GFR, MICROALBUR  Last diabetic Eye exam: No results found for: HMDIABEYEEXA  Last diabetic Foot exam: No results found for: HMDIABFOOTEX   Lab Results  Component Value Date   CHOL 170 03/06/2020   HDL 38 (L) 03/06/2020   LDLCALC 84 03/06/2020   TRIG 289 (H) 03/06/2020    Hepatic Function Latest Ref Rng & Units 03/06/2020 02/01/2019  Total Protein 6.0 - 8.5 g/dL 7.4 7.0  Albumin 3.8 - 4.8 g/dL 4.4 4.2  AST  0 - 40 IU/L 28 23  ALT 0 - 44 IU/L 37 29  Alk Phosphatase 39 - 117 IU/L 56 62  Total Bilirubin 0.0 - 1.2 mg/dL 0.5 0.4    Lab Results  Component Value Date/Time   TSH 1.480 03/06/2020 09:25 AM   TSH 1.630 02/01/2019 08:29 AM   FREET4 0.94 03/06/2020 09:25 AM   FREET4 0.97 02/01/2019 08:29 AM    CBC Latest Ref Rng & Units  03/06/2020 02/01/2019  WBC 3.4 - 10.8 x10E3/uL 7.2 8.5  Hemoglobin 13.0 - 17.7 g/dL 21.3 20.0  Hematocrit 05.0 - 51.0 % 43.0 43.3  Platelets 150 - 450 x10E3/uL 253 261    No results found for: VD25OH  Clinical ASCVD: No  The 10-year ASCVD risk score (Arnett DK, et al., 2019) is: 22.1%   Values used to calculate the score:     Age: 70 years     Sex: Male     Is Non-Hispanic African American: No     Diabetic: No     Tobacco smoker: No     Systolic Blood Pressure: 135 mmHg     Is BP treated: Yes     HDL Cholesterol: 38 mg/dL     Total Cholesterol: 170 mg/dL    Depression screen [facility] 2/9 08/06/2021 03/27/2021 12/21/2020  Decreased Interest 0 0 0  Down, Depressed, Hopeless 0 0 0  PHQ - 2 Score 0 0 0      Social History   Tobacco Use  Smoking Status Former   Types: Cigarettes   Quit date: 11/19/2011   Years since quitting: 9.8  Smokeless Tobacco Never   BP Readings from Last 3 Encounters:  09/25/21 135/72  08/06/21 140/82  05/03/21 (!) 142/88   Pulse Readings from Last 3 Encounters:  09/25/21 72  08/06/21 73  05/03/21 70   Wt Readings from Last 3 Encounters:  09/25/21 196 lb 6.4 oz (89.1 kg)  08/06/21 194 lb 3.2 oz (88.1 kg)  05/03/21 190 lb 6.4 oz (86.4 kg)   BMI Readings from Last 3 Encounters:  09/25/21 27.39 kg/m  08/06/21 27.09 kg/m  05/03/21 26.56 kg/m    Assessment/Interventions: Review of patient past medical history, allergies, medications, health status, including review of consultants reports, laboratory and other test data, was performed as part of comprehensive evaluation and provision of chronic care management services.   SDOH:  (Social Determinants of Health) assessments and interventions performed: Yes  SDOH Screenings   Alcohol Screen: Low Risk    Last Alcohol Screening Score (AUDIT): 0  Depression (PHQ2-9): Low Risk    PHQ-2 Score: 0  Financial Resource Strain: Low Risk    Difficulty of Paying Living Expenses: Not very hard  Food  Insecurity: Not on file  Housing: Not on file  Physical Activity: Not on file  Social Connections: Not on file  Stress: Not on file  Tobacco Use: Medium Risk   Smoking Tobacco Use: Former   Smokeless Tobacco Use: Never   Passive Exposure: Not on file  Transportation Needs: Not on file    CCM Care Plan  Allergies  Allergen Reactions   Poison Oak Extract Rash    Medications Reviewed Today     Reviewed by Monika Salk, [facility] (Pharmacist) on 09/28/21 at 1032  Med List Status: <None>   Medication Order Taking? Sig Documenting Provider Last Dose Status Informant  amLODipine (NORVASC) 5 MG tablet [id number]  Take 1 tablet (5 mg total) by mouth daily. Sallyanne Kuster, NP  Active  bisoprolol (ZEBETA) 5 MG tablet [id number]  Take 1 tablet (5 mg total) by mouth daily. Jonetta Osgood, NP  Active   gabapentin (NEURONTIN) 300 MG capsule [id number]  Take one tab po qhs for spasm Jonetta Osgood, NP  Active   hydrochlorothiazide (HYDRODIURIL) 12.5 MG tablet [id number]  TAKE 1 TABLET BY MOUTH EVERY DAY Abernathy, Alyssa, NP  Active   Multiple Vitamin (ONE-A-DAY MENS PO) [id number] No Take 1 tablet by mouth daily.  [provider] Taking Active Self  naproxen sodium (ANAPROX) 220 MG tablet [id number] No Take 220 mg by mouth 2 (two) times daily.  [provider] Taking Active Self  rosuvastatin (CRESTOR) 5 MG tablet [id number] No TAKE 1 TABLET BY MOUTH EVERY DAY Lavera Guise, MD Taking Active   traMADol (ULTRAM) 50 MG tablet [id number]  Take one tab po tid for knee OA Jonetta Osgood, NP  Active             Patient Active Problem List   Diagnosis Date Noted   Mixed hyperlipidemia 11/08/2019   Encounter for hepatitis C screening test for low risk patient 11/08/2019   Need for vaccination against Streptococcus pneumoniae using pneumococcal conjugate vaccine 7 11/08/2019   Encounter for routine adult health examination with abnormal findings 11/08/2018   Need for  vaccination against Streptococcus pneumoniae using pneumococcal conjugate vaccine 13 11/08/2018   Dysuria 11/08/2018   Acute upper respiratory infection 10/26/2018   Flu-like symptoms 10/26/2018   Right inguinal hernia 07/06/2018   Hypertension 04/30/2018   Primary osteoarthritis of both knees 04/30/2018   Primary insomnia 04/30/2018   Right lower quadrant abdominal tenderness with rebound tenderness 04/30/2018    Immunization History  Administered Date(s) Administered   Fluad Quad(high Dose 65+) 07/19/2019   Influenza Inj Mdck Quad Pf 08/06/2021   Influenza, High Dose Seasonal PF 08/16/2017, 08/19/2018   Influenza-Unspecified 10/02/2017, 07/19/2019, 09/09/2019, 08/01/2020, 08/15/2020   PFIZER Comirnaty(Gray Top)Covid-19 Tri-Sucrose Vaccine 05/08/2021   PFIZER(Purple Top)SARS-COV-2 Vaccination 01/10/2020, 02/02/2020, 08/29/2020    Conditions to be addressed/monitored:  Hypertension, Hyperlipidemia, Osteoarthritis, and Insomnia  Care Plan : [place]  Updates made by Alena Bills, [place] since 09/28/2021 12:00 AM     Problem: HTN, HLD, Chronic Pain, Insomnia   Priority: High     Long-Range Goal: Disease Mgmt   Start Date: 09/28/2021  Expected End Date: 09/28/2022  This Visit's Progress: On track  Priority: High  Note:   Current Barriers:  May get busy at work and occasionally forget to take his afternoon tramadol  Pharmacist Clinical Goal(s):  Patient will achieve adherence to monitoring guidelines and medication adherence to achieve therapeutic efficacy through collaboration with PharmD and provider.   Interventions: 1:1 collaboration with Lavera Guise, MD regarding development and update of comprehensive plan of care as evidenced by provider attestation and co-signature Inter-disciplinary care team collaboration (see longitudinal plan of care) Comprehensive medication review performed; medication list updated in electronic medical record  Hypertension  (BP goal <130/80) -Uncontrolled -Current treatment: Amlodipine 5mg  daily Bisoprolol 5mg  daily Hydrochlorothiazide 12.5mg  daily -Medications previously tried: Metoprolol Succ 50mg  daily  -Current home readings: 130s/70-80 -Current dietary habits:   Breakfast: 2 cups of coffee, bowl of cereal or poptart packs  Lunch: fast food usually, burger and soda, subway  Dinner: meat and veggies cooked by son who lives with him  Drink: regular soda, 2-4 bottles of water per day  Snacks: sandwich -Current exercise habits: no particular routine, stays active standing and moving at work all day -Denies  hypotensive/hypertensive symptoms -Educated on BP goals and benefits of medications for prevention of heart attack, stroke and kidney damage; Daily salt intake goal < 2300 mg; Importance of home blood pressure monitoring; Proper BP monitoring technique; -Counseled to monitor BP at home 1-2 times per week, document, and provide log at future appointments -Counseled on diet and exercise extensively Recommended to continue current medication  Hyperlipidemia: (LDL goal < 70) -Uncontrolled -Current treatment: Rosuvastatin 5mg  daily -Medications previously tried: None noted  -Current dietary patterns: see above -Current exercise habits: see above -Educated on Cholesterol goals;  Benefits of statin for ASCVD risk reduction; -Counseled on diet and exercise extensively Recommended to continue current medication Counseled on potential need to increase Rosuvastatin 5mg  to higher dose for better ASCVD reduction. Patient wants to see where his cholesterol levels currently are and discuss in future.  Osteoarthritis (Goal: pain-controlled to perform normal daily function) -Not ideally controlled -Current treatment  Naproxen 220mg  twice daily Tramadol 50mg  three times daily as needed for knee pain Gabapentin 300mg  at bedtime for spasm -Medications previously tried: None noted  -Recommended to continue  current medication Recommended to start taking tylenol extra strength, 1-2 tabs each day with tramadol -Recommended could try wearing knee brace at work  Insomnia (Goal: Healthy sleep) -Not ideally controlled -Current treatment  Gabapentin 300mg  capsule at bedtime -Medications previously tried: none noted  -Recommended to continue current medication  Patient Goals/Self-Care Activities Patient will:  - check blood pressure 1-2 times per week, document, and provide at future appointments engage in dietary modifications by decreasing salt intake at fast food restaurants  Follow Up Plan: Face to Face appointment with care management team member scheduled for: 04/17/2022  Alena Bills Clinical Pharmacist [phone]        Medication Assistance: None required.  Patient affirms current coverage meets needs.  Compliance/Adherence/Medication fill history: Care Gaps: Pneumonia vaccine  Star-Rating Drugs: Rosuvastatin 5mg  daily  Patient's preferred pharmacy is:  [id number] - [place], [place] - [address] [address] Phone: [phone] Fax: [phone]  Uses pill box? Yes Pt endorses 90% compliance  We discussed: Benefits of medication synchronization, packaging and delivery as well as enhanced pharmacist oversight with [employer]. Patient decided to: Continue current medication management strategy  Care Plan and Follow Up Patient Decision:  Patient agrees to Care Plan and Follow-up.  Plan: Face to Face appointment with care management team member scheduled for: 04/17/2022    Alena Bills Clinical Pharmacist [phone]

## 2021-09-25 NOTE — Progress Notes (Signed)
[facility] [place], [place] [address]  Internal MEDICINE  Office Visit Note  Patient Name: Terry Harvey  [id number]  [id number]  Date of Service: 09/25/2021  Chief Complaint  Patient presents with   Medicare Wellness    Knee pain continues    Hypertension   Hyperlipidemia    HPI Terry Harvey presents for an annual well visit and physical exam. He is a well-appearing 70 yo male. At his previous office visit, he was started on gabapentin at bedtime which has been helping. He had his labs drawn. All of his labs were within normal limits which includes his CBC, metabolic panel, thyroid, vitamin D, lipid panel, and PSA.   He has chronic knee pain. And he sees and orthopedic specialist for his knee. He denies any new pains. His blood pressure is well controlled at this time. His hyperlipidemia is under control with current medications. He has no other concerns or questions today.      Current Medication: Outpatient Encounter Medications as of 09/25/2021  Medication Sig   Multiple Vitamin (ONE-A-DAY MENS PO) Take 1 tablet by mouth daily.    naproxen sodium (ANAPROX) 220 MG tablet Take 220 mg by mouth 2 (two) times daily.    rosuvastatin (CRESTOR) 5 MG tablet TAKE 1 TABLET BY MOUTH EVERY DAY   [DISCONTINUED] amLODipine (NORVASC) 5 MG tablet TAKE 1 TABLET (5 MG TOTAL) BY MOUTH DAILY.   [DISCONTINUED] bisoprolol (ZEBETA) 5 MG tablet Take 1 tablet (5 mg total) by mouth daily.   [DISCONTINUED] gabapentin (NEURONTIN) 300 MG capsule Take one tab po qhs for spasm   [DISCONTINUED] hydrochlorothiazide (HYDRODIURIL) 12.5 MG tablet TAKE 1 TABLET BY MOUTH EVERY DAY   [DISCONTINUED] traMADol (ULTRAM) 50 MG tablet Take one tab po tid for knee OA   amLODipine (NORVASC) 5 MG tablet Take 1 tablet (5 mg total) by mouth daily.   bisoprolol (ZEBETA) 5 MG tablet Take 1 tablet (5 mg total) by mouth daily.   gabapentin (NEURONTIN) 300 MG capsule Take one tab po qhs for spasm    hydrochlorothiazide (HYDRODIURIL) 12.5 MG tablet TAKE 1 TABLET BY MOUTH EVERY DAY   traMADol (ULTRAM) 50 MG tablet Take one tab po tid for knee OA   [DISCONTINUED] Misc Natural Products (OSTEO BI-FLEX JOINT SHIELD PO) Take 2 tablets by mouth daily.  (Patient not taking: Reported on 09/25/2021)   No facility-administered encounter medications on file as of 09/25/2021.    Surgical History: Past Surgical History:  Procedure Laterality Date   APPENDECTOMY  2009   INGUINAL HERNIA REPAIR Right 11/12/2018   Procedure: HERNIA REPAIR INGUINAL ADULT;  Surgeon: Olean Ree, MD;  Location: [facility] ORS;  Service: General;  Laterality: Right;   TUMOR REMOVAL  2002   from right side of neck    Medical History: Past Medical History:  Diagnosis Date   Hyperlipidemia    Hypertension    Insomnia    Osteoarthritis     Family History: Family History  Problem Relation Age of Onset   Diabetes Mellitus II Mother    Hypertension Father     Social History   Socioeconomic History   Marital status: Widowed    Spouse name: Not on file   Number of children: 3   Years of education: Not on file   Highest education level: Not on file  Occupational History   Not on file  Tobacco Use   Smoking status: Former    Types: Cigarettes    Quit date:  11/19/2011    Years since quitting: 9.9   Smokeless tobacco: Never  Vaping Use   Vaping Use: Never used  Substance and Sexual Activity   Alcohol use: No    Alcohol/week: 0.0 standard drinks   Drug use: No    Comment: cbd gummies   Sexual activity: Not on file  Other Topics Concern   Not on file  Social History Narrative   Not on file   Social Determinants of Health   Financial Resource Strain: Low Risk    Difficulty of Paying Living Expenses: Not very hard  Food Insecurity: Not on file  Transportation Needs: Not on file  Physical Activity: Not on file  Stress: Not on file  Social Connections: Not on file  Intimate Partner Violence: Not on file       Review of Systems  Constitutional:  Negative for activity change, appetite change, chills, fatigue, fever and unexpected weight change.  HENT: Negative.  Negative for congestion, ear pain, rhinorrhea, sore throat and trouble swallowing.   Eyes: Negative.   Respiratory: Negative.  Negative for cough, chest tightness, shortness of breath and wheezing.   Cardiovascular: Negative.  Negative for chest pain.  Gastrointestinal: Negative.  Negative for abdominal pain, blood in stool, constipation, diarrhea, nausea and vomiting.  Endocrine: Negative.   Genitourinary: Negative.  Negative for difficulty urinating, dysuria, frequency, hematuria and urgency.  Musculoskeletal: Negative.  Negative for arthralgias, back pain, joint swelling, myalgias and neck pain.  Skin: Negative.  Negative for rash and wound.  Allergic/Immunologic: Negative.  Negative for immunocompromised state.  Neurological: Negative.  Negative for dizziness, seizures, numbness and headaches.  Hematological: Negative.   Psychiatric/Behavioral: Negative.  Negative for behavioral problems, self-injury and suicidal ideas. The patient is not nervous/anxious.    Vital Signs: BP 135/72   Pulse 72   Temp 98.8 F (37.1 C)   Resp 16   Ht 5\' 11"  (1.803 m)   Wt 196 lb 6.4 oz (89.1 kg)   SpO2 98%   BMI 27.39 kg/m    Physical Exam Vitals reviewed.  Constitutional:      General: He is awake. He is not in acute distress.    Appearance: Normal appearance. He is well-developed and well-groomed. He is obese. He is not ill-appearing or diaphoretic.  HENT:     Head: Normocephalic and atraumatic.     Right Ear: Tympanic membrane, ear canal and external ear normal.     Left Ear: Tympanic membrane, ear canal and external ear normal.     Nose: Nose normal. No congestion or rhinorrhea.     Mouth/Throat:     Lips: Pink.     Mouth: Mucous membranes are moist.     Pharynx: Oropharynx is clear. Uvula midline. No oropharyngeal exudate or  posterior oropharyngeal erythema.  Eyes:     General: Lids are normal. Vision grossly intact. Gaze aligned appropriately. No scleral icterus.       Right eye: No discharge.        Left eye: No discharge.     Conjunctiva/sclera: Conjunctivae normal.     Pupils: Pupils are equal, round, and reactive to light.     Funduscopic exam:    Right eye: Red reflex present.        Left eye: Red reflex present. Neck:     Thyroid: No thyromegaly.     Vascular: No JVD.     Trachea: No tracheal deviation.  Cardiovascular:     Rate and Rhythm: Normal rate and regular  rhythm.     Pulses: Normal pulses.     Heart sounds: Normal heart sounds, S1 normal and S2 normal. No murmur heard.   No friction rub. No gallop.  Pulmonary:     Effort: Pulmonary effort is normal. No accessory muscle usage or respiratory distress.     Breath sounds: Normal breath sounds and air entry. No stridor. No wheezing or rales.  Chest:     Chest wall: No tenderness.  Abdominal:     General: Bowel sounds are normal. There is no distension.     Palpations: Abdomen is soft. There is no shifting dullness, fluid wave, mass or pulsatile mass.     Tenderness: There is no abdominal tenderness. There is no guarding or rebound.  Musculoskeletal:        General: No tenderness or deformity. Normal range of motion.     Cervical back: Normal range of motion and neck supple.  Skin:    General: Skin is warm and dry.     Capillary Refill: Capillary refill takes less than 2 seconds.     Coloration: Skin is not pale.     Findings: No erythema or rash.  Neurological:     Mental Status: He is alert and oriented to person, place, and time.     Cranial Nerves: No cranial nerve deficit.     Motor: No abnormal muscle tone.     Coordination: Coordination normal.     Gait: Gait normal.     Deep Tendon Reflexes: Reflexes are normal and symmetric.  Psychiatric:        Mood and Affect: Mood and affect normal.        Behavior: Behavior normal.  Behavior is cooperative.        Thought Content: Thought content normal.        Judgment: Judgment normal.       Assessment/Plan: 1. Encounter for routine adult health examination with abnormal findings Age-appropriate preventive screenings and vaccinations discussed, annual physical exam completed. Routine labs for health maintenance ordered, orderer their medication. PHM updated.   2. Primary hypertension Stable with current medications, refills ordered.  - amLODipine (NORVASC) 5 MG tablet; Take 1 tablet (5 mg total) by mouth daily.  Dispense: 90 tablet; Refill: 1 - bisoprolol (ZEBETA) 5 MG tablet; Take 1 tablet (5 mg tal) by mouth daily.  Dispense: 90 tablet; Refill: 1 - hydrochlorothiazide (HYDRODIURIL) 12.5 MG tablet; TAKE 1 TABLET BY MOUTH EVERY DAY  Dispense: 90 tablet; Refill: 1  3. Chronic pain of left knee Tramadol ordered. Encouraged patient to see n - traMADol (ULTRAM) 50 MG tablet; Take one tab po tid for knee OA  Dispense: 90 tablet; Refill: 2  4. Bilateral leg pain Gabapentin prescribed to take at night.  - gabapentin (NEURONTIN) 300 MG capsule; Take one tab po qhs for spasm  Dispense: 30 capsule; Refill: 2  5. Dysuria Routine urinalysis done. - UA/M w/rflx Culture, Routine - Microscopic Examination       General Counseling: Terry Harvey verbalizes understanding of the findings of todays visit and agrees with plan of treatment. I have discussed any further diagnostic evaluation that may be needed or ordered today. We also reviewed his medications today. he has been encouraged to call the office with any questions or concerns that should arise related to todays visit.    Orders Placed This Encounter  Procedures   Microscopic Examination   UA/M w/rflx Culture, Routine    Meds ordered this encounter  Medications   amLODipine (  NORVASC) 5 MG tablet    Sig: Take 1 tablet (5 mg total) by mouth daily.    Dispense:  90 tablet    Refill:  1   bisoprolol (ZEBETA) 5  MG tablet    Sig: Take 1 tablet (5 mg total) by mouth daily.    Dispense:  90 tablet    Refill:  1   traMADol (ULTRAM) 50 MG tablet    Sig: Take one tab po tid for knee OA    Dispense:  90 tablet    Refill:  2   hydrochlorothiazide (HYDRODIURIL) 12.5 MG tablet    Sig: TAKE 1 TABLET BY MOUTH EVERY DAY    Dispense:  90 tablet    Refill:  1   gabapentin (NEURONTIN) 300 MG capsule    Sig: Take one tab po qhs for spasm    Dispense:  30 capsule    Refill:  2    Return in about 3 months (around 12/26/2021) for F/U, med refill, Burdett Pinzon PCP.   Total time spent:30 Minutes Time spent includes review of chart, medications, test results, and follow up plan with the patient.   [place] Controlled Substance Database was reviewed by me.  This patient was seen by Jonetta Osgood, FNP-C in collaboration with Dr. Clayborn Bigness as a part of collaborative care agreement.  Liberato Stansbery R. Valetta Fuller, MSN, FNP-C Internal medicine

## 2021-09-25 NOTE — Progress Notes (Incomplete)
[facility], [place] [address]  Internal MEDICINE  Office Visit Note  Patient Name: Terry Harvey  [id number]  [id number]  Date of Service: 09/25/2021  Chief Complaint  Patient presents with   Medicare Wellness    Knee pain continues    Hypertension   Hyperlipidemia    Hypertension Pertinent negatives include no chest pain, headaches, neck pain or shortness of breath.  Hyperlipidemia Pertinent negatives include no chest pain, myalgias or shortness of breath.  Terry Harvey presents for an annual well visit and physical exam. he has a history of ____.  Left knee pain, went back to work, pain has been worse, taking CBD gummies which have been effective.   Not interested in any vaccines Blood pressure good    Current Medication: Outpatient Encounter Medications as of 09/25/2021  Medication Sig   Multiple Vitamin (ONE-A-DAY MENS PO) Take 1 tablet by mouth daily.    naproxen sodium (ANAPROX) 220 MG tablet Take 220 mg by mouth 2 (two) times daily.    rosuvastatin (CRESTOR) 5 MG tablet TAKE 1 TABLET BY MOUTH EVERY DAY   [DISCONTINUED] amLODipine (NORVASC) 5 MG tablet TAKE 1 TABLET (5 MG TOTAL) BY MOUTH DAILY.   [DISCONTINUED] bisoprolol (ZEBETA) 5 MG tablet Take 1 tablet (5 mg total) by mouth daily.   [DISCONTINUED] gabapentin (NEURONTIN) 300 MG capsule Take one tab po qhs for spasm   [DISCONTINUED] hydrochlorothiazide (HYDRODIURIL) 12.5 MG tablet TAKE 1 TABLET BY MOUTH EVERY DAY   [DISCONTINUED] traMADol (ULTRAM) 50 MG tablet Take one tab po tid for knee OA   amLODipine (NORVASC) 5 MG tablet Take 1 tablet (5 mg total) by mouth daily.   bisoprolol (ZEBETA) 5 MG tablet Take 1 tablet (5 mg total) by mouth daily.   gabapentin (NEURONTIN) 300 MG capsule Take one tab po qhs for spasm   hydrochlorothiazide (HYDRODIURIL) 12.5 MG tablet TAKE 1 TABLET BY MOUTH EVERY DAY   traMADol (ULTRAM) 50 MG tablet Take one tab po tid for knee OA    [DISCONTINUED] Misc Natural Products (OSTEO BI-FLEX JOINT SHIELD PO) Take 2 tablets by mouth daily.  (Patient not taking: Reported on 09/25/2021)   No facility-administered encounter medications on file as of 09/25/2021.    Surgical History: Past Surgical History:  Procedure Laterality Date   APPENDECTOMY  2009   INGUINAL HERNIA REPAIR Right 11/12/2018   Procedure: HERNIA REPAIR INGUINAL ADULT;  Surgeon: Olean Ree, MD;  Location: [facility] ORS;  Service: General;  Laterality: Right;   TUMOR REMOVAL  2002   from right side of neck    Medical History: Past Medical History:  Diagnosis Date   Hyperlipidemia    Hypertension    Insomnia    Osteoarthritis     Family History: Family History  Problem Relation Age of Onset   Diabetes Mellitus II Mother    Hypertension Father     Social History   Socioeconomic History   Marital status: Widowed    Spouse name: Not on file   Number of children: 3   Years of education: Not on file   Highest education level: Not on file  Occupational History   Not on file  Tobacco Use   Smoking status: Former    Types: Cigarettes    Quit date: 11/19/2011    Years since quitting: 9.8   Smokeless tobacco: Never  Vaping Use   Vaping Use: Never used  Substance and Sexual Activity   Alcohol use: No  Alcohol/week: 0.0 standard drinks   Drug use: No    Comment: cbd gummies   Sexual activity: Not on file  Other Topics Concern   Not on file  Social History Narrative   Not on file   Social Determinants of Health   Financial Resource Strain: Not on file  Food Insecurity: Not on file  Transportation Needs: Not on file  Physical Activity: Not on file  Stress: Not on file  Social Connections: Not on file  Intimate Partner Violence: Not on file      Review of Systems  Constitutional:  Negative for activity change, appetite change, chills, fatigue, fever and unexpected weight change.  HENT: Negative.  Negative for  congestion, ear pain, rhinorrhea, sore throat and trouble swallowing.   Eyes: Negative.   Respiratory: Negative.  Negative for cough, chest tightness, shortness of breath and wheezing.   Cardiovascular: Negative.  Negative for chest pain.  Gastrointestinal: Negative.  Negative for abdominal pain, blood in stool, constipation, diarrhea, nausea and vomiting.  Endocrine: Negative.   Genitourinary: Negative.  Negative for difficulty urinating, dysuria, frequency, hematuria and urgency.  Musculoskeletal: Negative.  Negative for arthralgias, back pain, joint swelling, myalgias and neck pain.  Skin: Negative.  Negative for rash and wound.  Allergic/Immunologic: Negative.  Negative for immunocompromised state.  Neurological: Negative.  Negative for dizziness, seizures, numbness and headaches.  Hematological: Negative.   Psychiatric/Behavioral: Negative.  Negative for behavioral problems, self-injury and suicidal ideas. The patient is not nervous/anxious.    Vital Signs: BP 135/72    Pulse 72    Temp 98.8 F (37.1 C)    Resp 16    Ht 5\' 11"  (1.803 m)    Wt 196 lb 6.4 oz (89.1 kg)    SpO2 98%    BMI 27.39 kg/m    Physical Exam Vitals reviewed.  Constitutional:      General: He is not in acute distress.    Appearance: He is well-developed. He is not diaphoretic.  HENT:     Head: Normocephalic and atraumatic.     Right Ear: External ear normal.     Left Ear: External ear normal.     Nose: Nose normal.     Mouth/Throat:     Pharynx: No oropharyngeal exudate.  Eyes:     General: No scleral icterus.       Right eye: No discharge.        Left eye: No discharge.     Conjunctiva/sclera: Conjunctivae normal.     Pupils: Pupils are equal, round, and reactive to light.  Neck:     Thyroid: No thyromegaly.     Vascular: No JVD.     Trachea: No tracheal deviation.  Cardiovascular:     Rate and Rhythm: Normal rate and regular rhythm.     Heart sounds: Normal heart sounds. No murmur heard.   No  friction rub. No gallop.  Pulmonary:     Effort: Pulmonary effort is normal. No respiratory distress.     Breath sounds: Normal breath sounds. No stridor. No wheezing or rales.  Chest:     Chest wall: No tenderness.  Abdominal:     General: Bowel sounds are normal. There is no distension.     Palpations: Abdomen is soft. There is no mass.     Tenderness: There is no abdominal tenderness. There is no guarding or rebound.  Musculoskeletal:        General: No tenderness or deformity. Normal range of motion.  Cervical back: Normal range of motion and neck supple.  Lymphadenopathy:     Cervical: No cervical adenopathy.  Skin:    General: Skin is warm and dry.     Coloration: Skin is not pale.     Findings: No erythema or rash.  Neurological:     Mental Status: He is alert.     Cranial Nerves: No cranial nerve deficit.     Motor: No abnormal muscle tone.     Coordination: Coordination normal.     Deep Tendon Reflexes: Reflexes are normal and symmetric.  Psychiatric:        Behavior: Behavior normal.        Thought Content: Thought content normal.        Judgment: Judgment normal.       Assessment/Plan: 1. Dysuria - UA/M w/rflx Culture, Routine  2. Hypertension, unspecified type - amLODipine (NORVASC) 5 MG tablet; Take 1 tablet (5 mg total) by mouth daily.  Dispense: 90 tablet; Refill: 1 - bisoprolol (ZEBETA) 5 MG tablet; Take 1 tablet (5 mg total) by mouth daily.  Dispense: 90 tablet; Refill: 1 - hydrochlorothiazide (HYDRODIURIL) 12.5 MG tablet; TAKE 1 TABLET BY MOUTH EVERY DAY  Dispense: 90 tablet; Refill: 1  3. Chronic pain of left knee - traMADol (ULTRAM) 50 MG tablet; Take one tab po tid for knee OA  Dispense: 90 tablet; Refill: 2  4. Bilateral leg pain - gabapentin (NEURONTIN) 300 MG capsule; Take one tab po qhs for spasm  Dispense: 30 capsule; Refill: 2     General Counseling: Sylus verbalizes understanding of the findings of todays visit and agrees with plan  of treatment. I have discussed any further diagnostic evaluation that may be needed or ordered today. We also reviewed his medications today. he has been encouraged to call the office with any questions or concerns that should arise related to todays visit.    Orders Placed This Encounter  Procedures   UA/M w/rflx Culture, Routine    Meds ordered this encounter  Medications   amLODipine (NORVASC) 5 MG tablet    Sig: Take 1 tablet (5 mg total) by mouth daily.    Dispense:  90 tablet    Refill:  1   bisoprolol (ZEBETA) 5 MG tablet    Sig: Take 1 tablet (5 mg total) by mouth daily.    Dispense:  90 tablet    Refill:  1   traMADol (ULTRAM) 50 MG tablet    Sig: Take one tab po tid for knee OA    Dispense:  90 tablet    Refill:  2   hydrochlorothiazide (HYDRODIURIL) 12.5 MG tablet    Sig: TAKE 1 TABLET BY MOUTH EVERY DAY    Dispense:  90 tablet    Refill:  1   gabapentin (NEURONTIN) 300 MG capsule    Sig: Take one tab po qhs for spasm    Dispense:  30 capsule    Refill:  2    Return in about 3 months (around 12/26/2021) for F/U, med refill, Mattthew Ziomek PCP.   Total time spent:30 Minutes Time spent includes review of chart, medications, test results, and follow up plan with the patient.   [place] Controlled Substance Database was reviewed by me.  This patient was seen by Jonetta Osgood, FNP-C in collaboration with Dr. Clayborn Bigness as a part of collaborative care agreement.  Izik Bingman R. Valetta Fuller, MSN, FNP-C Internal medicine

## 2021-09-26 LAB — UA/M W/RFLX CULTURE, ROUTINE
Bilirubin, UA: NEGATIVE
Glucose, UA: NEGATIVE
Ketones, UA: NEGATIVE
Leukocytes,UA: NEGATIVE
Nitrite, UA: NEGATIVE
Protein,UA: NEGATIVE
RBC, UA: NEGATIVE
Specific Gravity, UA: <=1.005 — AB (ref 1.005–1.030)
Urobilinogen, Ur: 0.2 mg/dL (ref 0.2–1.0)
pH, UA: 7 (ref 5.0–7.5)

## 2021-09-26 LAB — MICROSCOPIC EXAMINATION
Bacteria, UA: NONE SEEN
Casts: NONE SEEN /lpf
Epithelial Cells (non renal): NONE SEEN /hpf (ref 0–10)
RBC, Urine: NONE SEEN /hpf (ref 0–2)
WBC, UA: NONE SEEN /hpf (ref 0–5)

## 2021-09-28 ENCOUNTER — Ambulatory Visit: Payer: Medicare HMO | Admitting: Student-PharmD

## 2021-09-28 ENCOUNTER — Other Ambulatory Visit: Payer: Self-pay

## 2021-09-28 DIAGNOSIS — G8929 Other chronic pain: Secondary | ICD-10-CM

## 2021-09-28 DIAGNOSIS — E782 Mixed hyperlipidemia: Secondary | ICD-10-CM

## 2021-09-28 DIAGNOSIS — M25562 Pain in left knee: Secondary | ICD-10-CM

## 2021-09-28 DIAGNOSIS — I10 Essential (primary) hypertension: Secondary | ICD-10-CM

## 2021-09-28 DIAGNOSIS — F5101 Primary insomnia: Secondary | ICD-10-CM

## 2021-09-28 NOTE — Patient Instructions (Signed)
Visit Information   Goals Addressed   None    There are no care plans to display for this patient.   Mr. Niccoli was given information about Chronic Care Management services today including:  CCM service includes personalized support from designated clinical staff supervised by his physician, including individualized plan of care and coordination with other care providers 24/7 contact phone numbers for assistance for urgent and routine care needs. Standard insurance, coinsurance, copays and deductibles apply for chronic care management only during months in which we provide at least 20 minutes of these services. Most insurances cover these services at 100%, however patients may be responsible for any copay, coinsurance and/or deductible if applicable. This service may help you avoid the need for more expensive face-to-face services. Only one practitioner may furnish and bill the service in a calendar month. The patient may stop CCM services at any time (effective at the end of the month) by phone call to the office staff.  Patient agreed to services and verbal consent obtained.   The patient verbalized understanding of instructions, educational materials, and care plan provided today and agreed to receive a mailed copy of patient instructions, educational materials, and care plan.  Face to Face appointment with pharmacist scheduled for: 04/17/2022  Alena Bills, [facility]  Clinical Pharmacist [phone]

## 2021-10-02 ENCOUNTER — Telehealth: Payer: Self-pay | Admitting: Student-PharmD

## 2021-10-02 NOTE — Progress Notes (Signed)
  Chronic Care Management Pharmacy Assistant   Name: Terry Harvey.  MRN: [id number] DOB: 1951-01-26  Reason for Encounter: CCM Care Plan  Medications: Outpatient Encounter Medications as of 10/02/2021  Medication Sig   amLODipine (NORVASC) 5 MG tablet Take 1 tablet (5 mg total) by mouth daily.   bisoprolol (ZEBETA) 5 MG tablet Take 1 tablet (5 mg total) by mouth daily.   gabapentin (NEURONTIN) 300 MG capsule Take one tab po qhs for spasm   hydrochlorothiazide (HYDRODIURIL) 12.5 MG tablet TAKE 1 TABLET BY MOUTH EVERY DAY   Multiple Vitamin (ONE-A-DAY MENS PO) Take 1 tablet by mouth daily.    naproxen sodium (ANAPROX) 220 MG tablet Take 220 mg by mouth 2 (two) times daily.    rosuvastatin (CRESTOR) 5 MG tablet TAKE 1 TABLET BY MOUTH EVERY DAY   traMADol (ULTRAM) 50 MG tablet Take one tab po tid for knee OA   No facility-administered encounter medications on file as of 10/02/2021.   Reviewed the patients initial visit reinsured it was completed per the pharmacist Alena Bills request. Printed the CCM care plan. Mailed the patient CCM care plan to their most recent address on file.   Follow-Up:Pharmacist Review  Charlann Lange, [place] Pharmacist Assistant [phone]

## 2021-10-05 DIAGNOSIS — E038 Other specified hypothyroidism: Secondary | ICD-10-CM | POA: Diagnosis not present

## 2021-10-05 DIAGNOSIS — E559 Vitamin D deficiency, unspecified: Secondary | ICD-10-CM | POA: Diagnosis not present

## 2021-10-05 DIAGNOSIS — I700 Atherosclerosis of aorta: Secondary | ICD-10-CM | POA: Diagnosis not present

## 2021-10-05 DIAGNOSIS — Z125 Encounter for screening for malignant neoplasm of prostate: Secondary | ICD-10-CM | POA: Diagnosis not present

## 2021-10-06 LAB — CBC WITH DIFFERENTIAL/PLATELET
Basophils Absolute: 0.1 10*3/uL (ref 0.0–0.2)
Basos: 1 %
EOS (ABSOLUTE): 0.2 10*3/uL (ref 0.0–0.4)
Eos: 3 %
Hematocrit: 45.5 % (ref 37.5–51.0)
Hemoglobin: 15.3 g/dL (ref 13.0–17.7)
Immature Grans (Abs): 0 10*3/uL (ref 0.0–0.1)
Immature Granulocytes: 0 %
Lymphocytes Absolute: 2.8 10*3/uL (ref 0.7–3.1)
Lymphs: 36 %
MCH: 30.3 pg (ref 26.6–33.0)
MCHC: 33.6 g/dL (ref 31.5–35.7)
MCV: 90 fL (ref 79–97)
Monocytes Absolute: 0.7 10*3/uL (ref 0.1–0.9)
Monocytes: 9 %
Neutrophils Absolute: 4.1 10*3/uL (ref 1.4–7.0)
Neutrophils: 51 %
Platelets: 245 10*3/uL (ref 150–450)
RBC: 5.05 x10E6/uL (ref 4.14–5.80)
RDW: 12.4 % (ref 11.6–15.4)
WBC: 7.8 10*3/uL (ref 3.4–10.8)

## 2021-10-06 LAB — CMP14+EGFR
ALT: 20 IU/L (ref 0–44)
AST: 23 IU/L (ref 0–40)
Albumin/Globulin Ratio: 1.8 (ref 1.2–2.2)
Albumin: 4.6 g/dL (ref 3.8–4.8)
Alkaline Phosphatase: 82 IU/L (ref 44–121)
BUN/Creatinine Ratio: 11 (ref 10–24)
BUN: 13 mg/dL (ref 8–27)
Bilirubin Total: 0.6 mg/dL (ref 0.0–1.2)
CO2: 28 mmol/L (ref 20–29)
Calcium: 9.5 mg/dL (ref 8.6–10.2)
Chloride: 99 mmol/L (ref 96–106)
Creatinine, Ser: 1.2 mg/dL (ref 0.76–1.27)
Globulin, Total: 2.6 g/dL (ref 1.5–4.5)
Glucose: 102 mg/dL — ABNORMAL HIGH (ref 70–99)
Potassium: 3.9 mmol/L (ref 3.5–5.2)
Sodium: 140 mmol/L (ref 134–144)
Total Protein: 7.2 g/dL (ref 6.0–8.5)
eGFR: 65 mL/min/{1.73_m2} (ref >59)

## 2021-10-06 LAB — PSA, TOTAL AND FREE
PSA, Free Pct: 58.9 %
PSA, Free: 0.53 ng/mL
Prostate Specific Ag, Serum: 0.9 ng/mL (ref 0.0–4.0)

## 2021-10-06 LAB — TSH+FREE T4
Free T4: 1.13 ng/dL (ref 0.82–1.77)
TSH: 1.27 u[IU]/mL (ref 0.450–4.500)

## 2021-10-06 LAB — LIPID PANEL
Chol/HDL Ratio: 2.7 ratio (ref 0.0–5.0)
Cholesterol, Total: 118 mg/dL (ref 100–199)
HDL: 44 mg/dL (ref >39)
LDL Chol Calc (NIH): 56 mg/dL (ref 0–99)
Triglycerides: 93 mg/dL (ref 0–149)
VLDL Cholesterol Cal: 18 mg/dL (ref 5–40)

## 2021-10-06 LAB — VITAMIN D 25 HYDROXY (VIT D DEFICIENCY, FRACTURES): Vit D, 25-Hydroxy: 59.6 ng/mL (ref 30.0–100.0)

## 2021-10-17 DIAGNOSIS — I10 Essential (primary) hypertension: Secondary | ICD-10-CM | POA: Diagnosis not present

## 2021-10-17 DIAGNOSIS — E782 Mixed hyperlipidemia: Secondary | ICD-10-CM | POA: Diagnosis not present

## 2021-10-24 ENCOUNTER — Encounter: Payer: Self-pay | Admitting: Nurse Practitioner

## 2021-10-29 ENCOUNTER — Other Ambulatory Visit: Payer: Self-pay | Admitting: Internal Medicine

## 2021-10-29 DIAGNOSIS — I10 Essential (primary) hypertension: Secondary | ICD-10-CM

## 2021-12-28 ENCOUNTER — Ambulatory Visit (INDEPENDENT_AMBULATORY_CARE_PROVIDER_SITE_OTHER): Payer: Medicare HMO | Admitting: Nurse Practitioner

## 2021-12-28 ENCOUNTER — Encounter: Payer: Self-pay | Admitting: Nurse Practitioner

## 2021-12-28 ENCOUNTER — Other Ambulatory Visit: Payer: Self-pay

## 2021-12-28 VITALS — BP 130/71 | HR 70 | Temp 98.9°F | Resp 16 | Ht 71.0 in | Wt 187.2 lb

## 2021-12-28 DIAGNOSIS — M79604 Pain in right leg: Secondary | ICD-10-CM

## 2021-12-28 DIAGNOSIS — M79605 Pain in left leg: Secondary | ICD-10-CM

## 2021-12-28 DIAGNOSIS — I10 Essential (primary) hypertension: Secondary | ICD-10-CM

## 2021-12-28 DIAGNOSIS — G8929 Other chronic pain: Secondary | ICD-10-CM

## 2021-12-28 DIAGNOSIS — M25562 Pain in left knee: Secondary | ICD-10-CM

## 2021-12-28 MED ORDER — HYDROCHLOROTHIAZIDE 12.5 MG PO TABS: ORAL_TABLET | ORAL | 2 refills | Status: DC

## 2021-12-28 MED ORDER — HYDROCHLOROTHIAZIDE 12.5 MG PO TABS: 12.5000 mg | ORAL_TABLET | Freq: Every day | ORAL | 1 refills | Status: DC

## 2021-12-28 MED ORDER — GABAPENTIN 300 MG PO CAPS: ORAL_CAPSULE | ORAL | 2 refills | Status: DC

## 2021-12-28 MED ORDER — TRAMADOL HCL 50 MG PO TABS: ORAL_TABLET | ORAL | 2 refills | Status: DC

## 2021-12-28 NOTE — Progress Notes (Signed)
[facility] [place], [place] [address]  Internal MEDICINE  Office Visit Note  Patient Name: Terry Harvey  [id number]  [id number]  Date of Service: 12/28/2021  Chief Complaint  Patient presents with   Follow-up   Hyperlipidemia   Hypertension   Medication Refill    HPI Terry Harvey presents for a follow up visit for medication refills. His last set of labs were normal in November 2022.Marland Kitchen His blood pressure is stable but he is having some swelling in his ankles at the end of the day. He takes tramadol daily for arthritis pains and has to come to the office for a visit every 3 months to continue to get refills. He reports that the tramadol and gabapentin continue to help with his aches and pains.    Current Medication: Outpatient Encounter Medications as of 12/28/2021  Medication Sig   amLODipine (NORVASC) 5 MG tablet Take 1 tablet (5 mg total) by mouth daily.   bisoprolol (ZEBETA) 5 MG tablet Take 1 tablet (5 mg total) by mouth daily.   hydrochlorothiazide (HYDRODIURIL) 12.5 MG tablet Take 1 tablet (12.5 mg total) by mouth daily.   Multiple Vitamin (ONE-A-DAY MENS PO) Take 1 tablet by mouth daily.    naproxen sodium (ANAPROX) 220 MG tablet Take 220 mg by mouth 2 (two) times daily.    rosuvastatin (CRESTOR) 5 MG tablet TAKE 1 TABLET BY MOUTH EVERY DAY   [DISCONTINUED] gabapentin (NEURONTIN) 300 MG capsule Take one tab po qhs for spasm   [DISCONTINUED] hydrochlorothiazide (HYDRODIURIL) 12.5 MG tablet TAKE 1/2 TABLET BY MOUTH EVERYDAY   [DISCONTINUED] traMADol (ULTRAM) 50 MG tablet Take one tab po tid for knee OA   gabapentin (NEURONTIN) 300 MG capsule Take one tab po qhs for spasm   traMADol (ULTRAM) 50 MG tablet Take one tab po tid for knee OA   [DISCONTINUED] hydrochlorothiazide (HYDRODIURIL) 12.5 MG tablet TAKE 1/2 TABLET BY MOUTH EVERYDAY   No facility-administered encounter medications on file as of 12/28/2021.    Surgical History: Past Surgical  History:  Procedure Laterality Date   APPENDECTOMY  2009   INGUINAL HERNIA REPAIR Right 11/12/2018   Procedure: HERNIA REPAIR INGUINAL ADULT;  Surgeon: Olean Ree, MD;  Location: [facility] ORS;  Service: General;  Laterality: Right;   TUMOR REMOVAL  2002   from right side of neck    Medical History: Past Medical History:  Diagnosis Date   Hyperlipidemia    Hypertension    Insomnia    Osteoarthritis     Family History: Family History  Problem Relation Age of Onset   Diabetes Mellitus II Mother    Hypertension Father     Social History   Socioeconomic History   Marital status: Widowed    Spouse name: Not on file   Number of children: 3   Years of education: Not on file   Highest education level: Not on file  Occupational History   Not on file  Tobacco Use   Smoking status: Former    Types: Cigarettes    Quit date: 11/19/2011    Years since quitting: 10.1   Smokeless tobacco: Never  Vaping Use   Vaping Use: Never used  Substance and Sexual Activity   Alcohol use: No    Alcohol/week: 0.0 standard drinks   Drug use: No    Comment: cbd gummies   Sexual activity: Not on file  Other Topics Concern   Not on file  Social History Narrative   Not on  file   Social Determinants of Health   Financial Resource Strain: Low Risk    Difficulty of Paying Living Expenses: Not very hard  Food Insecurity: Not on file  Transportation Needs: Not on file  Physical Activity: Not on file  Stress: Not on file  Social Connections: Not on file  Intimate Partner Violence: Not on file      Review of Systems  Constitutional:  Negative for chills, fatigue and unexpected weight change.  HENT:  Negative for congestion, rhinorrhea, sneezing and sore throat.   Eyes:  Negative for redness.  Respiratory:  Negative for cough, chest tightness and shortness of breath.   Cardiovascular:  Negative for chest pain and palpitations.  Gastrointestinal:  Negative for abdominal pain, constipation,  diarrhea, nausea and vomiting.  Genitourinary:  Negative for dysuria and frequency.  Musculoskeletal:  Negative for arthralgias, back pain, joint swelling and neck pain.  Skin:  Negative for rash.  Neurological: Negative.  Negative for tremors and numbness.  Hematological:  Negative for adenopathy. Does not bruise/bleed easily.  Psychiatric/Behavioral:  Negative for behavioral problems (Depression), sleep disturbance and suicidal ideas. The patient is not nervous/anxious.    Vital Signs: BP 130/71    Pulse 70    Temp 98.9 F (37.2 C)    Resp 16    Ht 5\' 11"  (1.803 m)    Wt 187 lb 3.2 oz (84.9 kg)    SpO2 97%    BMI 26.11 kg/m    Physical Exam Vitals reviewed.  Constitutional:      General: He is not in acute distress.    Appearance: Normal appearance. He is not ill-appearing.  HENT:     Head: Normocephalic and atraumatic.  Eyes:     Pupils: Pupils are equal, round, and reactive to light.  Cardiovascular:     Rate and Rhythm: Normal rate and regular rhythm.  Pulmonary:     Effort: Pulmonary effort is normal. No respiratory distress.  Neurological:     Mental Status: He is alert and oriented to person, place, and time.     Cranial Nerves: No cranial nerve deficit.     Coordination: Coordination normal.     Gait: Gait normal.  Psychiatric:        Mood and Affect: Mood normal.        Behavior: Behavior normal.       Assessment/Plan: 1. Chronic pain of left knee Stable, continue as prescribed.  - traMADol (ULTRAM) 50 MG tablet; Take one tab po tid for knee OA  Dispense: 90 tablet; Refill: 2  2. Primary hypertension HCTZ dose increased mainly for edema but will improve BP too - hydrochlorothiazide (HYDRODIURIL) 12.5 MG tablet; Take 1 tablet (12.5 mg total) by mouth daily.  Dispense: 90 tablet; Refill: 1  3. Bilateral leg pain Gabapentin is helping with leg pains at night, continue as prescribed.  - gabapentin (NEURONTIN) 300 MG capsule; Take one tab po qhs for spasm   Dispense: 30 capsule; Refill: 2   General Counseling: Eeshan verbalizes understanding of the findings of todays visit and agrees with plan of treatment. I have discussed any further diagnostic evaluation that may be needed or ordered today. We also reviewed his medications today. he has been encouraged to call the office with any questions or concerns that should arise related to todays visit.    No orders of the defined types were placed in this encounter.   Meds ordered this encounter  Medications   traMADol (ULTRAM) 50 MG tablet  Sig: Take one tab po tid for knee OA    Dispense:  90 tablet    Refill:  2   gabapentin (NEURONTIN) 300 MG capsule    Sig: Take one tab po qhs for spasm    Dispense:  30 capsule    Refill:  2   DISCONTD: hydrochlorothiazide (HYDRODIURIL) 12.5 MG tablet    Sig: TAKE 1/2 TABLET BY MOUTH EVERYDAY    Dispense:  45 tablet    Refill:  2   hydrochlorothiazide (HYDRODIURIL) 12.5 MG tablet    Sig: Take 1 tablet (12.5 mg total) by mouth daily.    Dispense:  90 tablet    Refill:  1    Please discontinue all previous orders for hydrochlorothiazide    Return in about 3 months (around 03/27/2022) for F/U, med refill, Breia Ocampo PCP.   Total time spent:30 Minutes Time spent includes review of chart, medications, test results, and follow up plan with the patient.   [place] Controlled Substance Database was reviewed by me.  This patient was seen by Jonetta Osgood, FNP-C in collaboration with Dr. Clayborn Bigness as a part of collaborative care agreement.   Embrie Mikkelsen R. Valetta Fuller, MSN, FNP-C Internal medicine

## 2022-01-01 ENCOUNTER — Telehealth: Payer: Self-pay | Admitting: Student-PharmD

## 2022-01-01 NOTE — Progress Notes (Addendum)
General Review Call  [facility]  [id number] 3 years, Male  DOB: Oct 16, 1951  M: [phone]  General Review [facility]) Completed by Charlann Lange on 01/01/2022  Chart Review What recent interventions/DTPs have been made by any provider to improve the patient's conditions in the last 3 months?: None.  Any recent hospitalizations or ED visits since last visit with CPP?: No  Adherence Review Adherence rates for STAR metric medications: Rosuvastatin 5 mg 12/05/21 90 DS  Adherence rates for medications indicated for disease state being reviewed: Rosuvastatin 5 mg 12/05/21 90 DS  Does the patient have >5 day gap between last estimated fill dates for any of the above medications?: No  Disease State Questions Able to connect with the Patient?: Yes  Did patient have any problems with their health recently?: No  Did patient have any problems with their pharmacy?: No  Does patient have any issues or side effects with their medications?: No  Additional  information to pass to Patient's CPP?: No  Anything we can do to help take better care of Patient?: No  Engagement Notes Charlann Lange on 12/27/2021 10:52 AM HC Chart Review: 6 min 12/27/21 [facility] Assessment call time spent: 4 min 01/01/22 CP Review: 10 min 01/01/22  Charlann Lange, [place]  [phone]  Pharmacist Review Adherence gaps identified?: No Drug Therapy Problems identified?: No Assessment: Controlled  Alena Bills Clinical Pharmacist

## 2022-01-15 DIAGNOSIS — I10 Essential (primary) hypertension: Secondary | ICD-10-CM

## 2022-01-15 DIAGNOSIS — E782 Mixed hyperlipidemia: Secondary | ICD-10-CM

## 2022-02-03 ENCOUNTER — Other Ambulatory Visit: Payer: Self-pay | Admitting: Nurse Practitioner

## 2022-02-03 DIAGNOSIS — I10 Essential (primary) hypertension: Secondary | ICD-10-CM

## 2022-03-29 ENCOUNTER — Ambulatory Visit: Payer: Medicare HMO | Admitting: Nurse Practitioner

## 2022-03-29 DIAGNOSIS — M1712 Unilateral primary osteoarthritis, left knee: Secondary | ICD-10-CM | POA: Diagnosis not present

## 2022-04-03 ENCOUNTER — Other Ambulatory Visit: Payer: Self-pay | Admitting: Nurse Practitioner

## 2022-04-03 DIAGNOSIS — I10 Essential (primary) hypertension: Secondary | ICD-10-CM

## 2022-04-05 DIAGNOSIS — I700 Atherosclerosis of aorta: Secondary | ICD-10-CM | POA: Diagnosis not present

## 2022-04-05 DIAGNOSIS — M1712 Unilateral primary osteoarthritis, left knee: Secondary | ICD-10-CM | POA: Diagnosis not present

## 2022-04-12 ENCOUNTER — Ambulatory Visit (INDEPENDENT_AMBULATORY_CARE_PROVIDER_SITE_OTHER): Payer: Medicare HMO | Admitting: Nurse Practitioner

## 2022-04-12 ENCOUNTER — Encounter: Payer: Self-pay | Admitting: Nurse Practitioner

## 2022-04-12 DIAGNOSIS — M79605 Pain in left leg: Secondary | ICD-10-CM | POA: Diagnosis not present

## 2022-04-12 DIAGNOSIS — E782 Mixed hyperlipidemia: Secondary | ICD-10-CM | POA: Diagnosis not present

## 2022-04-12 DIAGNOSIS — M25562 Pain in left knee: Secondary | ICD-10-CM

## 2022-04-12 DIAGNOSIS — M1712 Unilateral primary osteoarthritis, left knee: Secondary | ICD-10-CM | POA: Diagnosis not present

## 2022-04-12 DIAGNOSIS — M79604 Pain in right leg: Secondary | ICD-10-CM

## 2022-04-12 DIAGNOSIS — G8929 Other chronic pain: Secondary | ICD-10-CM | POA: Diagnosis not present

## 2022-04-12 MED ORDER — TRAMADOL HCL 50 MG PO TABS: ORAL_TABLET | ORAL | 2 refills | Status: DC

## 2022-04-12 MED ORDER — ROSUVASTATIN CALCIUM 5 MG PO TABS: 5.0000 mg | ORAL_TABLET | Freq: Every day | ORAL | 3 refills | Status: DC

## 2022-04-12 MED ORDER — GABAPENTIN 300 MG PO CAPS: ORAL_CAPSULE | ORAL | 1 refills | Status: DC

## 2022-04-12 NOTE — Progress Notes (Signed)
[facility] [place], [place] [address]  Internal MEDICINE  Office Visit Note  Patient Name: Terry Harvey  [id number]  [id number]  Date of Service: 04/12/2022  Chief Complaint  Patient presents with   Follow-up   Hypertension   Hyperlipidemia    HPI Terry Harvey presents for a follow-up visit for hypertension, hyperlipidemia and chronic osteoarthritis and joint pain.  He has a routine follow-up every 3 months for refills of tramadol.  He reports that he is doing well and is in good spirits.  He has been feeling well today and has recently been getting the gel injections in his left knee and he is receiving the final gel injection today at another appointment in a different clinic.  His wife passed away last year and he did have a period Of grieving.  He reports that recently he has acquired a girlfriend/significant other.  He has been enjoying spending time with this person.  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  All medication prescriptions were checked and any prescriptions that were in need of refills will be sent to the pharmacy.     Current Medication: Outpatient Encounter Medications as of 04/12/2022  Medication Sig   amLODipine (NORVASC) 5 MG tablet TAKE 1 TABLET (5 MG TOTAL) BY MOUTH DAILY.   bisoprolol (ZEBETA) 5 MG tablet TAKE 1 TABLET (5 MG TOTAL) BY MOUTH DAILY.   hydrochlorothiazide (HYDRODIURIL) 12.5 MG tablet Take 1 tablet (12.5 mg total) by mouth daily.   Multiple Vitamin (ONE-A-DAY MENS PO) Take 1 tablet by mouth daily.    naproxen sodium (ANAPROX) 220 MG tablet Take 220 mg by mouth 2 (two) times daily.    [DISCONTINUED] gabapentin (NEURONTIN) 300 MG capsule Take one tab po qhs for spasm   [DISCONTINUED] rosuvastatin (CRESTOR) 5 MG tablet TAKE 1 TABLET BY MOUTH EVERY  DAY   [DISCONTINUED] traMADol (ULTRAM) 50 MG tablet Take one tab po tid for knee OA   gabapentin (NEURONTIN) 300 MG capsule Take one tab po qhs for spasm   rosuvastatin (CRESTOR) 5 MG tablet Take 1 tablet (5 mg total) by mouth daily.   traMADol (ULTRAM) 50 MG tablet Take one tab po tid for knee OA   No facility-administered encounter medications on file as of 04/12/2022.    Surgical History: Past Surgical History:  Procedure Laterality Date   APPENDECTOMY  2009   INGUINAL HERNIA REPAIR Right 11/12/2018   Procedure: HERNIA REPAIR INGUINAL ADULT;  Surgeon: Olean Ree, MD;  Location: [facility] ORS;  Service: General;  Laterality: Right;   TUMOR REMOVAL  2002   from right side of neck    Medical History: Past Medical History:  Diagnosis Date   Hyperlipidemia    Hypertension    Insomnia    Osteoarthritis     Family History: Family History  Problem Relation Age of Onset   Diabetes Mellitus II Mother    Hypertension Father     Social History   Socioeconomic History   Marital status: Widowed    Spouse name: Not on file   Number of children: 3   Years of education: Not on file   Highest education level: Not on file  Occupational History   Not on file  Tobacco Use   Smoking status: Former    Types: Cigarettes  Quit date: 11/19/2011    Years since quitting: 10.4   Smokeless tobacco: Never  Vaping Use   Vaping Use: Never used  Substance and Sexual Activity   Alcohol use: No    Alcohol/week: 0.0 standard drinks   Drug use: No    Comment: cbd gummies   Sexual activity: Not on file  Other Topics Concern   Not on file  Social History Narrative   Not on file   Social Determinants of Health   Financial Resource Strain: Low Risk    Difficulty of Paying Living Expenses: Not very hard  Food Insecurity: Not on file  Transportation Needs: Not on file  Physical Activity: Not on file  Stress: Not on file  Social Connections: Not on file  Intimate Partner Violence: Not on  file      Review of Systems  Constitutional:  Negative for chills, fatigue and unexpected weight change.  HENT:  Negative for congestion, rhinorrhea, sneezing and sore throat.   Eyes:  Negative for redness.  Respiratory:  Negative for cough, chest tightness and shortness of breath.   Cardiovascular:  Negative for chest pain and palpitations.  Gastrointestinal:  Negative for abdominal pain, constipation, diarrhea, nausea and vomiting.  Genitourinary:  Negative for dysuria and frequency.  Musculoskeletal:  Negative for arthralgias, back pain, joint swelling and neck pain.  Skin:  Negative for rash.  Neurological: Negative.  Negative for tremors and numbness.  Hematological:  Negative for adenopathy. Does not bruise/bleed easily.  Psychiatric/Behavioral:  Negative for behavioral problems (Depression), sleep disturbance and suicidal ideas. The patient is not nervous/anxious.    Vital Signs: BP 123/68   Pulse 61   Temp 98 F (36.7 C)   Resp 16   Ht '5\' 11"'$  (1.803 m)   Wt 183 lb (83 kg)   SpO2 99%   BMI 25.52 kg/m    Physical Exam Vitals reviewed.  Constitutional:      General: He is not in acute distress.    Appearance: Normal appearance. He is not ill-appearing.  HENT:     Head: Normocephalic and atraumatic.  Eyes:     Pupils: Pupils are equal, round, and reactive to light.  Cardiovascular:     Rate and Rhythm: Normal rate and regular rhythm.  Pulmonary:     Effort: Pulmonary effort is normal. No respiratory distress.  Neurological:     Mental Status: He is alert and oriented to person, place, and time.     Cranial Nerves: No cranial nerve deficit.     Coordination: Coordination normal.     Gait: Gait normal.  Psychiatric:        Mood and Affect: Mood normal.        Behavior: Behavior normal.       Assessment/Plan: 1. Chronic pain of left knee Stable, refills ordered. - traMADol (ULTRAM) 50 MG tablet; Take one tab po tid for knee OA  Dispense: 90 tablet;  Refill: 2  2. Mixed hyperlipidemia Stable, refills ordered. - rosuvastatin (CRESTOR) 5 MG tablet; Take 1 tablet (5 mg total) by mouth daily.  Dispense: 90 tablet; Refill: 3  3. Bilateral leg pain Patient is considering asking to have this dose increased but stated he wanted to keep the dose the same for now and will discuss any changes at his next office visit.  Refills ordered - gabapentin (NEURONTIN) 300 MG capsule; Take one tab po qhs for spasm  Dispense: 90 capsule; Refill: 1   General Counseling: Caylin verbalizes understanding of the  findings of todays visit and agrees with plan of treatment. I have discussed any further diagnostic evaluation that may be needed or ordered today. We also reviewed his medications today. he has been encouraged to call the office with any questions or concerns that should arise related to todays visit.    No orders of the defined types were placed in this encounter.   Meds ordered this encounter  Medications   traMADol (ULTRAM) 50 MG tablet    Sig: Take one tab po tid for knee OA    Dispense:  90 tablet    Refill:  2   rosuvastatin (CRESTOR) 5 MG tablet    Sig: Take 1 tablet (5 mg total) by mouth daily.    Dispense:  90 tablet    Refill:  3    For future refills   gabapentin (NEURONTIN) 300 MG capsule    Sig: Take one tab po qhs for spasm    Dispense:  90 capsule    Refill:  1    Return in about 3 months (around 07/13/2022) for F/U, anxiety med refill, Drelyn Pistilli PCP.   Total time spent:30 Minutes Time spent includes review of chart, medications, test results, and follow up plan with the patient.   [place] Controlled Substance Database was reviewed by me.  This patient was seen by Jonetta Osgood, FNP-C in collaboration with Dr. Clayborn Bigness as a part of collaborative care agreement.   [facility] R. Valetta Fuller, MSN, FNP-C Internal medicine

## 2022-04-17 ENCOUNTER — Ambulatory Visit: Payer: Medicare HMO

## 2022-04-18 ENCOUNTER — Telehealth: Payer: Self-pay

## 2022-04-18 ENCOUNTER — Encounter: Payer: Self-pay | Admitting: Nurse Practitioner

## 2022-04-18 NOTE — Telephone Encounter (Signed)
Lvm to schedule acute appt-Toni

## 2022-04-22 ENCOUNTER — Ambulatory Visit (INDEPENDENT_AMBULATORY_CARE_PROVIDER_SITE_OTHER): Payer: Medicare HMO | Admitting: Physician Assistant

## 2022-04-22 ENCOUNTER — Encounter: Payer: Self-pay | Admitting: Physician Assistant

## 2022-04-22 VITALS — BP 122/74 | HR 74 | Temp 98.2°F | Resp 16 | Ht 71.0 in | Wt 186.0 lb

## 2022-04-22 DIAGNOSIS — R221 Localized swelling, mass and lump, neck: Secondary | ICD-10-CM

## 2022-04-22 NOTE — Progress Notes (Signed)
[facility] [place], [place] [address]  Internal MEDICINE  Office Visit Note  Patient Name: Terry Harvey  [id number]  [id number]  Date of Service: 05/01/2022  Chief Complaint  Patient presents with   Acute Visit    Knot on right side of neck     Allergies     HPI Pt is here for a sick visit. -Knot on right side of neck that he noticed last Tuesday that increased Wednesday. Was tender at first, but not anymore. Has gone down in size since then, but is still there. -has been having some watery eyes, but other wise no symptoms -Denies fatigue or SOB -hx of warthin tumor in 2002 that he had removed on the same side but higher up  Current Medication:  Outpatient Encounter Medications as of 04/22/2022  Medication Sig   amLODipine (NORVASC) 5 MG tablet TAKE 1 TABLET (5 MG TOTAL) BY MOUTH DAILY.   bisoprolol (ZEBETA) 5 MG tablet TAKE 1 TABLET (5 MG TOTAL) BY MOUTH DAILY.   gabapentin (NEURONTIN) 300 MG capsule Take one tab po qhs for spasm   hydrochlorothiazide (HYDRODIURIL) 12.5 MG tablet Take 1 tablet (12.5 mg total) by mouth daily.   Multiple Vitamin (ONE-A-DAY MENS PO) Take 1 tablet by mouth daily.    naproxen sodium (ANAPROX) 220 MG tablet Take 220 mg by mouth 2 (two) times daily.    rosuvastatin (CRESTOR) 5 MG tablet Take 1 tablet (5 mg total) by mouth daily.   traMADol (ULTRAM) 50 MG tablet Take one tab po tid for knee OA   No facility-administered encounter medications on file as of 04/22/2022.      Medical History: Past Medical History:  Diagnosis Date   Hyperlipidemia    Hypertension    Insomnia    Osteoarthritis      Vital Signs: BP 122/74   Pulse 74   Temp 98.2 F (36.8 C)   Resp 16   Ht '5\' 11"'$  (1.803 m)   Wt 186 lb (84.4 kg)   SpO2 97%   BMI 25.94 kg/m    Review of Systems  Constitutional:  Negative for fatigue and fever.  HENT:  Negative for congestion, mouth sores, postnasal drip, sore throat, trouble swallowing  and voice change.        Knot on right side of neck  Respiratory:  Negative for cough.   Cardiovascular:  Negative for chest pain.  Genitourinary:  Negative for flank pain.  Psychiatric/Behavioral: Negative.      Physical Exam Vitals reviewed.  Constitutional:      General: He is not in acute distress.    Appearance: Normal appearance. He is not ill-appearing.  HENT:     Head: Normocephalic and atraumatic.  Eyes:     Pupils: Pupils are equal, round, and reactive to light.  Neck:     Comments: ~1cm round mass along right side of neck beneath jaw, non tender to palpation Cardiovascular:     Rate and Rhythm: Normal rate and regular rhythm.  Pulmonary:     Effort: Pulmonary effort is normal. No respiratory distress.  Musculoskeletal:     Cervical back: Normal range of motion. No tenderness.  Neurological:     Mental Status: He is alert and oriented to person, place, and time.     Cranial Nerves: No cranial nerve deficit.     Coordination: Coordination normal.     Gait: Gait normal.  Psychiatric:        Mood and  Affect: Mood normal.        Behavior: Behavior normal.       Assessment/Plan: 1. Lump on neck Will order labs as well as [place] for further evaluation. Per pt it has been improving since he first noticed it and will call if it resolves completely or if any worsening or other symptoms arise. - CBC w/Diff/Platelet - US Soft Tissue Head/Neck (NON-THYROID); Future - Comprehensive metabolic panel   General Counseling: Maeson verbalizes understanding of the findings of todays visit and agrees with plan of treatment. I have discussed any further diagnostic evaluation that may be needed or ordered today. We also reviewed his medications today. he has been encouraged to call the office with any questions or concerns that should arise related to todays visit.    Counseling:    Orders Placed This Encounter  Procedures   US Soft Tissue Head/Neck (NON-THYROID)   CBC  w/Diff/Platelet   Comprehensive metabolic panel    No orders of the defined types were placed in this encounter.   Time spent:30 Minutes

## 2022-04-23 LAB — CBC WITH DIFFERENTIAL/PLATELET
Basophils Absolute: 0.1 10*3/uL (ref 0.0–0.2)
Basos: 1 %
EOS (ABSOLUTE): 0.2 10*3/uL (ref 0.0–0.4)
Eos: 2 %
Hematocrit: 38.3 % (ref 37.5–51.0)
Hemoglobin: 13 g/dL (ref 13.0–17.7)
Immature Grans (Abs): 0 10*3/uL (ref 0.0–0.1)
Immature Granulocytes: 0 %
Lymphocytes Absolute: 2.9 10*3/uL (ref 0.7–3.1)
Lymphs: 38 %
MCH: 31 pg (ref 26.6–33.0)
MCHC: 33.9 g/dL (ref 31.5–35.7)
MCV: 91 fL (ref 79–97)
Monocytes Absolute: 0.7 10*3/uL (ref 0.1–0.9)
Monocytes: 9 %
Neutrophils Absolute: 3.8 10*3/uL (ref 1.4–7.0)
Neutrophils: 50 %
Platelets: 269 10*3/uL (ref 150–450)
RBC: 4.19 x10E6/uL (ref 4.14–5.80)
RDW: 12 % (ref 11.6–15.4)
WBC: 7.5 10*3/uL (ref 3.4–10.8)

## 2022-04-23 LAB — COMPREHENSIVE METABOLIC PANEL
ALT: 12 IU/L (ref 0–44)
AST: 19 IU/L (ref 0–40)
Albumin/Globulin Ratio: 1.7 (ref 1.2–2.2)
Albumin: 4.4 g/dL (ref 3.8–4.8)
Alkaline Phosphatase: 63 IU/L (ref 44–121)
BUN/Creatinine Ratio: 11 (ref 10–24)
BUN: 13 mg/dL (ref 8–27)
Bilirubin Total: 0.3 mg/dL (ref 0.0–1.2)
CO2: 26 mmol/L (ref 20–29)
Calcium: 9.3 mg/dL (ref 8.6–10.2)
Chloride: 102 mmol/L (ref 96–106)
Creatinine, Ser: 1.14 mg/dL (ref 0.76–1.27)
Globulin, Total: 2.6 g/dL (ref 1.5–4.5)
Glucose: 85 mg/dL (ref 70–99)
Potassium: 4.2 mmol/L (ref 3.5–5.2)
Sodium: 141 mmol/L (ref 134–144)
Total Protein: 7 g/dL (ref 6.0–8.5)
eGFR: 69 mL/min/{1.73_m2} (ref >59)

## 2022-05-01 ENCOUNTER — Telehealth: Payer: Self-pay

## 2022-05-01 NOTE — Telephone Encounter (Signed)
-----   Message from Mylinda Latina, PA-C sent at 05/01/2022 12:23 PM EDT ----- Please let him know his labs were all normal

## 2022-05-01 NOTE — Telephone Encounter (Addendum)
LMOM that labs were normal advised to call office if any questions

## 2022-05-04 ENCOUNTER — Other Ambulatory Visit: Payer: Self-pay | Admitting: Nurse Practitioner

## 2022-05-04 DIAGNOSIS — G8929 Other chronic pain: Secondary | ICD-10-CM

## 2022-05-05 NOTE — Telephone Encounter (Signed)
Please let patient know that the tramadol was reordered at his last visit with 2 additional refills so there should be 2 refills at the pharmacy.

## 2022-05-06 ENCOUNTER — Telehealth: Payer: Self-pay

## 2022-05-06 NOTE — Telephone Encounter (Signed)
Spoke with patient regarding lab results on 05/06/2022.

## 2022-05-06 NOTE — Telephone Encounter (Signed)
-----   Message from Mylinda Latina, PA-C sent at 05/01/2022 12:23 PM EDT ----- Please let him know his labs were all normal

## 2022-05-27 ENCOUNTER — Telehealth: Payer: Self-pay

## 2022-05-27 NOTE — Telephone Encounter (Signed)
Left vm and sent mychart message to confirm 06/03/22 appointment-Toni

## 2022-06-03 ENCOUNTER — Other Ambulatory Visit: Payer: Medicare HMO

## 2022-06-13 ENCOUNTER — Ambulatory Visit: Payer: Medicare HMO | Admitting: Physician Assistant

## 2022-06-14 DIAGNOSIS — H524 Presbyopia: Secondary | ICD-10-CM | POA: Diagnosis not present

## 2022-07-12 ENCOUNTER — Encounter: Payer: Self-pay | Admitting: Nurse Practitioner

## 2022-07-12 ENCOUNTER — Ambulatory Visit (INDEPENDENT_AMBULATORY_CARE_PROVIDER_SITE_OTHER): Payer: Medicare HMO | Admitting: Nurse Practitioner

## 2022-07-12 VITALS — BP 135/71 | HR 67 | Temp 98.4°F | Resp 16 | Ht 71.0 in | Wt 185.0 lb

## 2022-07-12 DIAGNOSIS — M79604 Pain in right leg: Secondary | ICD-10-CM

## 2022-07-12 DIAGNOSIS — E782 Mixed hyperlipidemia: Secondary | ICD-10-CM

## 2022-07-12 DIAGNOSIS — G8929 Other chronic pain: Secondary | ICD-10-CM | POA: Diagnosis not present

## 2022-07-12 DIAGNOSIS — M25562 Pain in left knee: Secondary | ICD-10-CM | POA: Diagnosis not present

## 2022-07-12 DIAGNOSIS — I10 Essential (primary) hypertension: Secondary | ICD-10-CM

## 2022-07-12 DIAGNOSIS — M79605 Pain in left leg: Secondary | ICD-10-CM | POA: Diagnosis not present

## 2022-07-12 MED ORDER — TRAMADOL HCL 50 MG PO TABS: ORAL_TABLET | ORAL | 2 refills | Status: DC

## 2022-07-12 MED ORDER — HYDROCHLOROTHIAZIDE 12.5 MG PO TABS: 12.5000 mg | ORAL_TABLET | Freq: Every day | ORAL | 3 refills | Status: DC

## 2022-07-12 MED ORDER — AMLODIPINE BESYLATE 5 MG PO TABS: 5.0000 mg | ORAL_TABLET | Freq: Every day | ORAL | 3 refills | Status: DC

## 2022-07-12 MED ORDER — BISOPROLOL FUMARATE 5 MG PO TABS: 5.0000 mg | ORAL_TABLET | Freq: Every day | ORAL | 3 refills | Status: DC

## 2022-07-12 MED ORDER — GABAPENTIN 300 MG PO CAPS: ORAL_CAPSULE | ORAL | 3 refills | Status: DC

## 2022-07-12 NOTE — Progress Notes (Unsigned)
[facility] [place], [place] [address]  Internal MEDICINE  Office Visit Note  Patient Name: Terry Harvey  [id number]  [id number]  Date of Service: 07/12/2022  Chief Complaint  Patient presents with   Follow-up   Hyperlipidemia   Hypertension   Medication Problem    Hydrochlorothiazide   Medication Refill    Tramadol    HPI Terry Harvey presents for follow-up visit for hypertension, chronic osteoarthritis and hyperlipidemia and medication refills. -- Blood pressure is well controlled with current medications.  He does need refills but there is a discrepancy in the instructions for hydrochlorothiazide and a pharmacy try to fill the previous prescription order which was for a half tab but he is currently taking a whole tablet of hydrochlorothiazide.  This will be corrected with his refill today -- Current dose of tramadol continues to remain effective, patient needs refills today. -- Patient continues to tolerate rosuvastatin with no significant adverse side effects -- All medication refills will be sent to pharmacy today that are needed.    Current Medication: Outpatient Encounter Medications as of 07/12/2022  Medication Sig   Multiple Vitamin (ONE-A-DAY MENS PO) Take 1 tablet by mouth daily.    naproxen sodium (ANAPROX) 220 MG tablet Take 220 mg by mouth 2 (two) times daily.    rosuvastatin (CRESTOR) 5 MG tablet Take 1 tablet (5 mg total) by mouth daily.   [DISCONTINUED] amLODipine (NORVASC) 5 MG tablet TAKE 1 TABLET (5 MG TOTAL) BY MOUTH DAILY.   [DISCONTINUED] bisoprolol (ZEBETA) 5 MG tablet TAKE 1 TABLET (5 MG TOTAL) BY MOUTH DAILY.   [DISCONTINUED] gabapentin (NEURONTIN) 300 MG capsule Take one tab po qhs for spasm   [DISCONTINUED] hydrochlorothiazide (HYDRODIURIL) 12.5 MG tablet Take 1 tablet (12.5 mg total) by mouth daily.   [DISCONTINUED] traMADol (ULTRAM) 50 MG tablet Take one tab po tid for knee OA   amLODipine (NORVASC) 5 MG tablet Take 1  tablet (5 mg total) by mouth daily.   bisoprolol (ZEBETA) 5 MG tablet Take 1 tablet (5 mg total) by mouth daily.   gabapentin (NEURONTIN) 300 MG capsule Take one tab po qhs for spasm   hydrochlorothiazide (HYDRODIURIL) 12.5 MG tablet Take 1 tablet (12.5 mg total) by mouth daily.   traMADol (ULTRAM) 50 MG tablet Take one tab po tid for knee OA   No facility-administered encounter medications on file as of 07/12/2022.    Surgical History: Past Surgical History:  Procedure Laterality Date   APPENDECTOMY  2009   INGUINAL HERNIA REPAIR Right 11/12/2018   Procedure: HERNIA REPAIR INGUINAL ADULT;  Surgeon: Olean Ree, MD;  Location: [facility] ORS;  Service: General;  Laterality: Right;   TUMOR REMOVAL  2002   from right side of neck    Medical History: Past Medical History:  Diagnosis Date   Hyperlipidemia    Hypertension    Insomnia    Osteoarthritis     Family History: Family History  Problem Relation Age of Onset   Diabetes Mellitus II Mother    Hypertension Father     Social History   Socioeconomic History   Marital status: Widowed    Spouse name: Not on file   Number of children: 3   Years of education: Not on file   Highest education level: Not on file  Occupational History   Not on file  Tobacco Use   Smoking status: Former    Types: Cigarettes    Quit date: 11/19/2011    Years since  quitting: 10.6   Smokeless tobacco: Never  Vaping Use   Vaping Use: Never used  Substance and Sexual Activity   Alcohol use: No    Alcohol/week: 0.0 standard drinks of alcohol   Drug use: No    Comment: cbd gummies   Sexual activity: Not on file  Other Topics Concern   Not on file  Social History Narrative   Not on file   Social Determinants of Health   Financial Resource Strain: Low Risk  (09/28/2021)   Overall Financial Resource Strain (CARDIA)    Difficulty of Paying Living Expenses: Not very hard  Food Insecurity: Not on file  Transportation Needs: Not on file   Physical Activity: Not on file  Stress: Not on file  Social Connections: Not on file  Intimate Partner Violence: Not on file      Review of Systems  Constitutional:  Negative for chills, fatigue and unexpected weight change.  HENT:  Negative for congestion, rhinorrhea, sneezing and sore throat.   Eyes:  Negative for redness.  Respiratory:  Negative for cough, chest tightness and shortness of breath.   Cardiovascular:  Negative for chest pain and palpitations.  Gastrointestinal:  Negative for abdominal pain, constipation, diarrhea, nausea and vomiting.  Genitourinary:  Negative for dysuria and frequency.  Musculoskeletal:  Negative for arthralgias, back pain, joint swelling and neck pain.  Skin:  Negative for rash.  Neurological: Negative.  Negative for tremors and numbness.  Hematological:  Negative for adenopathy. Does not bruise/bleed easily.  Psychiatric/Behavioral:  Negative for behavioral problems (Depression), sleep disturbance and suicidal ideas. The patient is not nervous/anxious.     Vital Signs: BP 135/71   Pulse 67   Temp 98.4 F (36.9 C)   Resp 16   Ht '5\' 11"'$  (1.803 m)   Wt 185 lb (83.9 kg)   SpO2 95%   BMI 25.80 kg/m    Physical Exam Vitals reviewed.  Constitutional:      General: He is not in acute distress.    Appearance: Normal appearance. He is not ill-appearing.  HENT:     Head: Normocephalic and atraumatic.  Eyes:     Pupils: Pupils are equal, round, and reactive to light.  Cardiovascular:     Rate and Rhythm: Normal rate and regular rhythm.  Pulmonary:     Effort: Pulmonary effort is normal. No respiratory distress.  Neurological:     Mental Status: He is alert and oriented to person, place, and time.     Cranial Nerves: No cranial nerve deficit.     Coordination: Coordination normal.     Gait: Gait normal.  Psychiatric:        Mood and Affect: Mood normal.        Behavior: Behavior normal.        Assessment/Plan: 1. Primary  hypertension Blood pressure is stable and controlled with current medication, refills ordered. - hydrochlorothiazide (HYDRODIURIL) 12.5 MG tablet; Take 1 tablet (12.5 mg total) by mouth daily.  Dispense: 90 tablet; Refill: 3 - bisoprolol (ZEBETA) 5 MG tablet; Take 1 tablet (5 mg total) by mouth daily.  Dispense: 90 tablet; Refill: 3 - amLODipine (NORVASC) 5 MG tablet; Take 1 tablet (5 mg total) by mouth daily.  Dispense: 90 tablet; Refill: 3  2. Chronic pain of left knee Continue tramadol 50 mg 3 times daily as needed for chronic knee pain/osteoarthritis, 3 months of refills sent to pharmacy, follow-up in 3 months for additional refills. - traMADol (ULTRAM) 50 MG tablet; Take one  tab po tid for knee OA  Dispense: 90 tablet; Refill: 2  3. Bilateral leg pain Continue gabapentin 300 mg at bedtime for neuropathy/sciatica - gabapentin (NEURONTIN) 300 MG capsule; Take one tab po qhs for spasm  Dispense: 90 capsule; Refill: 3  4. Mixed hyperlipidemia Continue rosuvastatin as prescribed, no refills needed   General Counseling: Klyde verbalizes understanding of the findings of todays visit and agrees with plan of treatment. I have discussed any further diagnostic evaluation that may be needed or ordered today. We also reviewed his medications today. he has been encouraged to call the office with any questions or concerns that should arise related to todays visit.    No orders of the defined types were placed in this encounter.   Meds ordered this encounter  Medications   traMADol (ULTRAM) 50 MG tablet    Sig: Take one tab po tid for knee OA    Dispense:  90 tablet    Refill:  2   hydrochlorothiazide (HYDRODIURIL) 12.5 MG tablet    Sig: Take 1 tablet (12.5 mg total) by mouth daily.    Dispense:  90 tablet    Refill:  3    MAKE SURE TO DISCONTINUE ALL PREVIOUS ORDERS FOR HYDROCHLOROTHIAZIDE.   gabapentin (NEURONTIN) 300 MG capsule    Sig: Take one tab po qhs for spasm    Dispense:  90  capsule    Refill:  3   bisoprolol (ZEBETA) 5 MG tablet    Sig: Take 1 tablet (5 mg total) by mouth daily.    Dispense:  90 tablet    Refill:  3   amLODipine (NORVASC) 5 MG tablet    Sig: Take 1 tablet (5 mg total) by mouth daily.    Dispense:  90 tablet    Refill:  3    Return in 3 months (on 10/01/2022) for previously scheduled, CPE, Kemontae Dunklee PCP and will do tramadol refills then .   Total time spent:30 Minutes Time spent includes review of chart, medications, test results, and follow up plan with the patient.   [place] Controlled Substance Database was reviewed by me.  This patient was seen by Jonetta Osgood, FNP-C in collaboration with Dr. Clayborn Bigness as a part of collaborative care agreement.   [facility] R. Valetta Fuller, MSN, FNP-C Internal medicine

## 2022-07-13 ENCOUNTER — Encounter: Payer: Self-pay | Admitting: Nurse Practitioner

## 2022-07-30 DIAGNOSIS — M1712 Unilateral primary osteoarthritis, left knee: Secondary | ICD-10-CM | POA: Diagnosis not present

## 2022-08-02 ENCOUNTER — Other Ambulatory Visit: Payer: Self-pay | Admitting: Nurse Practitioner

## 2022-08-02 DIAGNOSIS — G8929 Other chronic pain: Secondary | ICD-10-CM

## 2022-08-28 ENCOUNTER — Encounter: Payer: Self-pay | Admitting: Nurse Practitioner

## 2022-09-17 ENCOUNTER — Ambulatory Visit: Payer: Medicare HMO | Admitting: Nurse Practitioner

## 2022-10-01 ENCOUNTER — Encounter: Payer: Self-pay | Admitting: Nurse Practitioner

## 2022-10-01 ENCOUNTER — Ambulatory Visit (INDEPENDENT_AMBULATORY_CARE_PROVIDER_SITE_OTHER): Payer: Medicare HMO | Admitting: Nurse Practitioner

## 2022-10-01 VITALS — BP 134/79 | HR 76 | Temp 98.3°F | Resp 16 | Ht 71.0 in | Wt 180.2 lb

## 2022-10-01 DIAGNOSIS — G8929 Other chronic pain: Secondary | ICD-10-CM | POA: Diagnosis not present

## 2022-10-01 DIAGNOSIS — R350 Frequency of micturition: Secondary | ICD-10-CM

## 2022-10-01 DIAGNOSIS — Z0001 Encounter for general adult medical examination with abnormal findings: Secondary | ICD-10-CM

## 2022-10-01 DIAGNOSIS — R3915 Urgency of urination: Secondary | ICD-10-CM

## 2022-10-01 DIAGNOSIS — M25562 Pain in left knee: Secondary | ICD-10-CM

## 2022-10-01 DIAGNOSIS — M6281 Muscle weakness (generalized): Secondary | ICD-10-CM

## 2022-10-01 DIAGNOSIS — E782 Mixed hyperlipidemia: Secondary | ICD-10-CM

## 2022-10-01 DIAGNOSIS — R3589 Other polyuria: Secondary | ICD-10-CM

## 2022-10-01 MED ORDER — TRAMADOL HCL 50 MG PO TABS: ORAL_TABLET | ORAL | 2 refills | Status: DC

## 2022-10-01 NOTE — Progress Notes (Signed)
[facility] [place], [place] [address]  Internal MEDICINE  Office Visit Note  Patient Name: Terry Harvey  [id number]  [id number]  Date of Service: 10/01/2022  Chief Complaint  Patient presents with   Medicare Wellness   Hypertension   Hyperlipidemia    HPI Terry Harvey presents for an annual well visit and physical exam. Well-appearing 71 year old male with hypertension, osteoarthritis and high cholesterol.  --has the cologuard test at home, states that he will send it in soon.  --having generalized muscle weakness, soreness, and some numbness and tingling in his fingers. States that he feels like all of his joints are really sore without any additional strenuous activity more than his usual. He did recently retire for a second time and is not working now.  --also experiencing urinary urgency, frequency and polyuria. States he is drinking water all the time but denies polydipsia, says that he has just been in the habit of drinking water regularly. Urinates 6-7 times daily and about 3 times during the night.      Current Medication: Outpatient Encounter Medications as of 10/01/2022  Medication Sig   amLODipine (NORVASC) 5 MG tablet Take 1 tablet (5 mg total) by mouth daily.   bisoprolol (ZEBETA) 5 MG tablet Take 1 tablet (5 mg total) by mouth daily.   gabapentin (NEURONTIN) 300 MG capsule Take one tab po qhs for spasm   hydrochlorothiazide (HYDRODIURIL) 12.5 MG tablet Take 1 tablet (12.5 mg total) by mouth daily.   Multiple Vitamin (ONE-A-DAY MENS PO) Take 1 tablet by mouth daily.    naproxen sodium (ANAPROX) 220 MG tablet Take 220 mg by mouth 2 (two) times daily.    rosuvastatin (CRESTOR) 5 MG tablet Take 1 tablet (5 mg total) by mouth daily.   [DISCONTINUED] traMADol (ULTRAM) 50 MG tablet Take one tab po tid for knee OA   traMADol (ULTRAM) 50 MG tablet Take one tab po tid for knee OA   No facility-administered encounter medications on file as of  10/01/2022.    Surgical History: Past Surgical History:  Procedure Laterality Date   APPENDECTOMY  2009   INGUINAL HERNIA REPAIR Right 11/12/2018   Procedure: HERNIA REPAIR INGUINAL ADULT;  Surgeon: Olean Ree, MD;  Location: [facility] ORS;  Service: General;  Laterality: Right;   TUMOR REMOVAL  2002   from right side of neck    Medical History: Past Medical History:  Diagnosis Date   Hyperlipidemia    Hypertension    Insomnia    Osteoarthritis     Family History: Family History  Problem Relation Age of Onset   Diabetes Mellitus II Mother    Hypertension Father     Social History   Socioeconomic History   Marital status: Widowed    Spouse name: Not on file   Number of children: 3   Years of education: Not on file   Highest education level: Not on file  Occupational History   Not on file  Tobacco Use   Smoking status: Former    Types: Cigarettes    Quit date: 11/19/2011    Years since quitting: 10.8   Smokeless tobacco: Never  Vaping Use   Vaping Use: Never used  Substance and Sexual Activity   Alcohol use: No    Alcohol/week: 0.0 standard drinks of alcohol   Drug use: No    Comment: cbd gummies   Sexual activity: Not on file  Other Topics Concern   Not on file  Social History Narrative   Not on file   Social Determinants of Health   Financial Resource Strain: Low Risk  (09/28/2021)   Overall Financial Resource Strain (CARDIA)    Difficulty of Paying Living Expenses: Not very hard  Food Insecurity: Not on file  Transportation Needs: Not on file  Physical Activity: Not on file  Stress: Not on file  Social Connections: Not on file  Intimate Partner Violence: Not on file      Review of Systems  Constitutional:  Positive for activity change and fatigue. Negative for appetite change, chills, fever and unexpected weight change.  HENT: Negative.  Negative for congestion, ear pain, rhinorrhea, sore throat and trouble swallowing.   Eyes: Negative.    Respiratory: Negative.  Negative for cough, chest tightness, shortness of breath and wheezing.   Cardiovascular: Negative.  Negative for chest pain and palpitations.  Gastrointestinal: Negative.  Negative for abdominal pain, blood in stool, constipation, diarrhea, nausea and vomiting.  Endocrine: Positive for polydipsia and polyuria.  Genitourinary:  Positive for frequency and urgency. Negative for difficulty urinating, dysuria and hematuria.  Musculoskeletal: Negative.  Negative for arthralgias, back pain, joint swelling, myalgias and neck pain.  Skin: Negative.  Negative for rash and wound.  Allergic/Immunologic: Negative.  Negative for immunocompromised state.  Neurological:  Positive for weakness and numbness (and tingling in fingers). Negative for dizziness, seizures and headaches.  Hematological: Negative.   Psychiatric/Behavioral: Negative.  Negative for behavioral problems, self-injury, sleep disturbance and suicidal ideas. The patient is not nervous/anxious.     Vital Signs: BP 134/79   Pulse 76   Temp 98.3 F (36.8 C)   Resp 16   Ht _0  (1.803 m)   Wt 180 lb 3.2 oz (81.7 kg)   SpO2 95%   BMI 25.13 kg/m    Physical Exam Vitals reviewed.  Constitutional:      General: He is awake. He is not in acute distress.    Appearance: Normal appearance. He is well-developed, well-groomed and normal weight. He is not ill-appearing or diaphoretic.  HENT:     Head: Normocephalic and atraumatic.     Right Ear: Tympanic membrane, ear canal and external ear normal.     Left Ear: Tympanic membrane, ear canal and external ear normal.     Nose: Nose normal. No congestion or rhinorrhea.     Mouth/Throat:     Lips: Pink.     Mouth: Mucous membranes are moist.     Pharynx: Oropharynx is clear. Uvula midline. No oropharyngeal exudate or posterior oropharyngeal erythema.  Eyes:     General: Lids are normal. Vision grossly intact. Gaze aligned appropriately. No scleral icterus.        Right eye: No discharge.        Left eye: No discharge.     Conjunctiva/sclera: Conjunctivae normal.     Pupils: Pupils are equal, round, and reactive to light.     Funduscopic exam:    Right eye: Red reflex present.        Left eye: Red reflex present. Neck:     Thyroid: No thyromegaly.     Vascular: No JVD.     Trachea: No tracheal deviation.  Cardiovascular:     Rate and Rhythm: Normal rate and regular rhythm.     Pulses: Normal pulses.     Heart sounds: Normal heart sounds, S1 normal and S2 normal. No murmur heard.    No friction rub. No gallop.  Pulmonary:  Effort: Pulmonary effort is normal. No accessory muscle usage or respiratory distress.     Breath sounds: Normal breath sounds and air entry. No stridor. No wheezing or rales.  Chest:     Chest wall: No tenderness.  Abdominal:     General: Bowel sounds are normal. There is no distension.     Palpations: Abdomen is soft. There is no shifting dullness, fluid wave, mass or pulsatile mass.     Tenderness: There is no abdominal tenderness. There is no guarding or rebound.  Musculoskeletal:        General: No tenderness or deformity. Normal range of motion.     Cervical back: Normal range of motion and neck supple.  Skin:    General: Skin is warm and dry.     Capillary Refill: Capillary refill takes less than 2 seconds.     Coloration: Skin is not pale.     Findings: No erythema or rash.  Neurological:     Mental Status: He is alert and oriented to person, place, and time.     Cranial Nerves: No cranial nerve deficit.     Motor: No abnormal muscle tone.     Coordination: Coordination normal.     Gait: Gait normal.     Deep Tendon Reflexes: Reflexes are normal and symmetric.  Psychiatric:        Mood and Affect: Mood and affect normal.        Behavior: Behavior normal. Behavior is cooperative.        Thought Content: Thought content normal.        Judgment: Judgment normal.        Assessment/Plan: 1. Encounter  for routine adult health examination with abnormal findings Age-appropriate preventive screenings and vaccinations discussed, annual physical exam completed. Routine labs for health maintenance ordered see below. PHM updated.   2. Generalized muscle weakness Labs ordered to rule out possible causes - CBC with Differential/Platelet - Iron, TIBC and Ferritin Panel - CMP14+EGFR - B12 and Folate Panel - Magnesium  3. Frequency of urination and polyuria Additional labs ordered - PSA Total (Reflex To Free) - Hgb A1C w/o eAG  4. Urinary urgency Additional labs ordered - PSA Total (Reflex To Free) - Hgb A1C w/o eAG  5. Mixed hyperlipidemia Routine lab ordered - Lipid Profile  6. Chronic pain of left knee Refills ordered, no significant changes, take tramadol as prescribed, follow up in 3 months for additional refills.  - traMADol (ULTRAM) 50 MG tablet; Take one tab po tid for knee OA  Dispense: 90 tablet; Refill: 2     General Counseling: Panayiotis verbalizes understanding of the findings of todays visit and agrees with plan of treatment. I have discussed any further diagnostic evaluation that may be needed or ordered today. We also reviewed his medications today. he has been encouraged to call the office with any questions or concerns that should arise related to todays visit.    Orders Placed This Encounter  Procedures   CBC with Differential/Platelet   Iron, TIBC and Ferritin Panel   CMP14+EGFR   Lipid Profile   PSA Total (Reflex To Free)   B12 and Folate Panel   Hgb A1C w/o eAG   Magnesium    Meds ordered this encounter  Medications   traMADol (ULTRAM) 50 MG tablet    Sig: Take one tab po tid for knee OA    Dispense:  90 tablet    Refill:  2    Return in about  3 months (around 01/01/2023) for F/U, pain med refill, Safal Halderman PCP.   Total time spent:30 Minutes Time spent includes review of chart, medications, test results, and follow up plan with the patient.   [place]  Controlled Substance Database was reviewed by me.  This patient was seen by Jonetta Osgood, FNP-C in collaboration with Dr. Clayborn Bigness as a part of collaborative care agreement.  Hazleigh Mccleave R. Valetta Fuller, MSN, FNP-C Internal medicine

## 2022-10-02 ENCOUNTER — Encounter: Payer: Self-pay | Admitting: Nurse Practitioner

## 2022-10-02 DIAGNOSIS — G8929 Other chronic pain: Secondary | ICD-10-CM | POA: Insufficient documentation

## 2022-10-02 DIAGNOSIS — Z8249 Family history of ischemic heart disease and other diseases of the circulatory system: Secondary | ICD-10-CM | POA: Diagnosis not present

## 2022-10-02 DIAGNOSIS — I739 Peripheral vascular disease, unspecified: Secondary | ICD-10-CM | POA: Diagnosis not present

## 2022-10-02 DIAGNOSIS — K219 Gastro-esophageal reflux disease without esophagitis: Secondary | ICD-10-CM | POA: Diagnosis not present

## 2022-10-02 DIAGNOSIS — M179 Osteoarthritis of knee, unspecified: Secondary | ICD-10-CM | POA: Diagnosis not present

## 2022-10-02 DIAGNOSIS — Z888 Allergy status to other drugs, medicaments and biological substances status: Secondary | ICD-10-CM | POA: Diagnosis not present

## 2022-10-02 DIAGNOSIS — Z791 Long term (current) use of non-steroidal anti-inflammatories (NSAID): Secondary | ICD-10-CM | POA: Diagnosis not present

## 2022-10-02 DIAGNOSIS — Z87891 Personal history of nicotine dependence: Secondary | ICD-10-CM | POA: Diagnosis not present

## 2022-10-02 DIAGNOSIS — I10 Essential (primary) hypertension: Secondary | ICD-10-CM | POA: Diagnosis not present

## 2022-10-02 DIAGNOSIS — R252 Cramp and spasm: Secondary | ICD-10-CM | POA: Diagnosis not present

## 2022-10-02 DIAGNOSIS — Z811 Family history of alcohol abuse and dependence: Secondary | ICD-10-CM | POA: Diagnosis not present

## 2022-10-02 DIAGNOSIS — Z79891 Long term (current) use of opiate analgesic: Secondary | ICD-10-CM | POA: Diagnosis not present

## 2022-10-02 DIAGNOSIS — E785 Hyperlipidemia, unspecified: Secondary | ICD-10-CM | POA: Diagnosis not present

## 2022-10-03 DIAGNOSIS — R3915 Urgency of urination: Secondary | ICD-10-CM | POA: Diagnosis not present

## 2022-10-03 DIAGNOSIS — M6281 Muscle weakness (generalized): Secondary | ICD-10-CM | POA: Diagnosis not present

## 2022-10-03 DIAGNOSIS — E782 Mixed hyperlipidemia: Secondary | ICD-10-CM | POA: Diagnosis not present

## 2022-10-03 DIAGNOSIS — R350 Frequency of micturition: Secondary | ICD-10-CM | POA: Diagnosis not present

## 2022-10-03 DIAGNOSIS — R3589 Other polyuria: Secondary | ICD-10-CM | POA: Diagnosis not present

## 2022-10-03 DIAGNOSIS — D508 Other iron deficiency anemias: Secondary | ICD-10-CM | POA: Diagnosis not present

## 2022-10-03 DIAGNOSIS — R7309 Other abnormal glucose: Secondary | ICD-10-CM | POA: Diagnosis not present

## 2022-10-08 LAB — LIPID PANEL
Chol/HDL Ratio: 2.8 ratio (ref 0.0–5.0)
Cholesterol, Total: 143 mg/dL (ref 100–199)
HDL: 52 mg/dL (ref >39)
LDL Chol Calc (NIH): 63 mg/dL (ref 0–99)
Triglycerides: 168 mg/dL — ABNORMAL HIGH (ref 0–149)
VLDL Cholesterol Cal: 28 mg/dL (ref 5–40)

## 2022-10-08 LAB — CMP14+EGFR
ALT: 17 IU/L (ref 0–44)
AST: 19 IU/L (ref 0–40)
Albumin/Globulin Ratio: 2.1 (ref 1.2–2.2)
Albumin: 5.1 g/dL — ABNORMAL HIGH (ref 3.9–4.9)
Alkaline Phosphatase: 79 IU/L (ref 44–121)
BUN/Creatinine Ratio: 15 (ref 10–24)
BUN: 17 mg/dL (ref 8–27)
Bilirubin Total: 0.4 mg/dL (ref 0.0–1.2)
CO2: 24 mmol/L (ref 20–29)
Calcium: 10 mg/dL (ref 8.6–10.2)
Chloride: 100 mmol/L (ref 96–106)
Creatinine, Ser: 1.14 mg/dL (ref 0.76–1.27)
Globulin, Total: 2.4 g/dL (ref 1.5–4.5)
Glucose: 99 mg/dL (ref 70–99)
Potassium: 4.3 mmol/L (ref 3.5–5.2)
Sodium: 145 mmol/L — ABNORMAL HIGH (ref 134–144)
Total Protein: 7.5 g/dL (ref 6.0–8.5)
eGFR: 69 mL/min/{1.73_m2} (ref >59)

## 2022-10-08 LAB — CBC WITH DIFFERENTIAL/PLATELET
Basophils Absolute: 0.1 10*3/uL (ref 0.0–0.2)
Basos: 1 %
EOS (ABSOLUTE): 0.4 10*3/uL (ref 0.0–0.4)
Eos: 4 %
Hematocrit: 45.4 % (ref 37.5–51.0)
Hemoglobin: 15.4 g/dL (ref 13.0–17.7)
Immature Grans (Abs): 0 10*3/uL (ref 0.0–0.1)
Immature Granulocytes: 0 %
Lymphocytes Absolute: 3.8 10*3/uL — ABNORMAL HIGH (ref 0.7–3.1)
Lymphs: 38 %
MCH: 31.1 pg (ref 26.6–33.0)
MCHC: 33.9 g/dL (ref 31.5–35.7)
MCV: 92 fL (ref 79–97)
Monocytes Absolute: 0.8 10*3/uL (ref 0.1–0.9)
Monocytes: 8 %
Neutrophils Absolute: 5 10*3/uL (ref 1.4–7.0)
Neutrophils: 49 %
Platelets: 278 10*3/uL (ref 150–450)
RBC: 4.95 x10E6/uL (ref 4.14–5.80)
RDW: 12.2 % (ref 11.6–15.4)
WBC: 10 10*3/uL (ref 3.4–10.8)

## 2022-10-08 LAB — MAGNESIUM: Magnesium: 2.3 mg/dL (ref 1.6–2.3)

## 2022-10-08 LAB — IRON,TIBC AND FERRITIN PANEL
Ferritin: 114 ng/mL (ref 30–400)
Iron Saturation: 23 % (ref 15–55)
Iron: 83 ug/dL (ref 38–169)
Total Iron Binding Capacity: 357 ug/dL (ref 250–450)
UIBC: 274 ug/dL (ref 111–343)

## 2022-10-08 LAB — B12 AND FOLATE PANEL
Folate: >20 ng/mL (ref >3.0)
Vitamin B-12: 715 pg/mL (ref 232–1245)

## 2022-10-08 LAB — HGB A1C W/O EAG: Hgb A1c MFr Bld: 5.7 % — ABNORMAL HIGH (ref 4.8–5.6)

## 2022-10-08 LAB — PSA TOTAL (REFLEX TO FREE): Prostate Specific Ag, Serum: 1 ng/mL (ref 0.0–4.0)

## 2022-10-16 ENCOUNTER — Encounter: Payer: Self-pay | Admitting: Nurse Practitioner

## 2022-10-21 NOTE — Progress Notes (Signed)
Please call patient, let him know that his labs were grossly normal except  --A1c 5.7 which is prediabetic which might make him feel bad if his sugars are fluctuating  --lymphocytes (a type of white blood cell) were elevated so he could have been fighting off some bug or infection  --sodium is slightly high which is possible when fasting

## 2022-10-22 ENCOUNTER — Telehealth: Payer: Self-pay

## 2022-10-22 NOTE — Telephone Encounter (Signed)
-----   Message from Corlis Hove sent at 10/22/2022  4:14 PM EST ----- Regarding: RE: labs  ----- Message ----- From: Jonetta Osgood, NP Sent: 10/21/2022   5:05 AM EST To: Tyron Russell, CMA Subject: labs                                           Please call patient, let him know that his labs were grossly normal except --A1c 5.7 which is prediabetic which might make him feel bad if his sugars are fluctuating --lymphocytes (a type of white blood cell) were elevated so he could have been fighting off some bug or infection --sodium is slightly high which is possible when fasting

## 2022-10-22 NOTE — Telephone Encounter (Signed)
Patient notified

## 2022-12-26 DIAGNOSIS — M1712 Unilateral primary osteoarthritis, left knee: Secondary | ICD-10-CM | POA: Diagnosis not present

## 2022-12-26 DIAGNOSIS — I700 Atherosclerosis of aorta: Secondary | ICD-10-CM | POA: Diagnosis not present

## 2022-12-30 ENCOUNTER — Encounter: Payer: Self-pay | Admitting: Nurse Practitioner

## 2022-12-30 ENCOUNTER — Ambulatory Visit (INDEPENDENT_AMBULATORY_CARE_PROVIDER_SITE_OTHER): Payer: Medicare HMO | Admitting: Nurse Practitioner

## 2022-12-30 VITALS — BP 136/82 | HR 82 | Temp 98.3°F | Resp 16 | Ht 71.0 in | Wt 187.8 lb

## 2022-12-30 DIAGNOSIS — M25562 Pain in left knee: Secondary | ICD-10-CM | POA: Diagnosis not present

## 2022-12-30 DIAGNOSIS — I10 Essential (primary) hypertension: Secondary | ICD-10-CM | POA: Diagnosis not present

## 2022-12-30 DIAGNOSIS — G8929 Other chronic pain: Secondary | ICD-10-CM | POA: Diagnosis not present

## 2022-12-30 DIAGNOSIS — E782 Mixed hyperlipidemia: Secondary | ICD-10-CM | POA: Diagnosis not present

## 2022-12-30 MED ORDER — ROSUVASTATIN CALCIUM 5 MG PO TABS: 5.0000 mg | ORAL_TABLET | Freq: Every day | ORAL | 3 refills | Status: DC

## 2022-12-30 MED ORDER — TRAMADOL HCL 50 MG PO TABS: ORAL_TABLET | ORAL | 2 refills | Status: DC

## 2022-12-30 NOTE — Progress Notes (Signed)
[facility] [place], [place] [address]  Internal MEDICINE  Office Visit Note  Patient Name: Terry Harvey  [id number]  [id number]  Date of Service: 12/30/2022  Chief Complaint  Patient presents with   Hypertension   Hyperlipidemia   Follow-up    HPI Terry Harvey presents for follow-up visit for hypertension, chronic osteoarthritis and hyperlipidemia and medication refills.  Blood pressure is controlled with current medications Chronic left knee pain -- due for refills of tramadol. Waiting for the call from Dr. Clydell Hakim surgery scheduler for a left knee replacement surgery.  High cholesterol -- taking rosuvastatin without incident.      Current Medication: Outpatient Encounter Medications as of 12/30/2022  Medication Sig   amLODipine (NORVASC) 5 MG tablet Take 1 tablet (5 mg total) by mouth daily.   bisoprolol (ZEBETA) 5 MG tablet Take 1 tablet (5 mg total) by mouth daily.   gabapentin (NEURONTIN) 300 MG capsule Take one tab po qhs for spasm   hydrochlorothiazide (HYDRODIURIL) 12.5 MG tablet Take 1 tablet (12.5 mg total) by mouth daily.   Multiple Vitamin (ONE-A-DAY MENS PO) Take 1 tablet by mouth daily.    naproxen sodium (ANAPROX) 220 MG tablet Take 220 mg by mouth 2 (two) times daily.    [DISCONTINUED] rosuvastatin (CRESTOR) 5 MG tablet Take 1 tablet (5 mg total) by mouth daily.   [DISCONTINUED] traMADol (ULTRAM) 50 MG tablet Take one tab po tid for knee OA   rosuvastatin (CRESTOR) 5 MG tablet Take 1 tablet (5 mg total) by mouth daily.   traMADol (ULTRAM) 50 MG tablet Take one tab po tid for knee OA   No facility-administered encounter medications on file as of 12/30/2022.    Surgical History: Past Surgical History:  Procedure Laterality Date   APPENDECTOMY  2009   INGUINAL HERNIA REPAIR Right 11/12/2018   Procedure: HERNIA REPAIR INGUINAL ADULT;  Surgeon: Olean Ree, MD;  Location: [facility] ORS;  Service: General;  Laterality: Right;   TUMOR  REMOVAL  2002   from right side of neck    Medical History: Past Medical History:  Diagnosis Date   Hyperlipidemia    Hypertension    Insomnia    Osteoarthritis     Family History: Family History  Problem Relation Age of Onset   Diabetes Mellitus II Mother    Hypertension Father     Social History   Socioeconomic History   Marital status: Widowed    Spouse name: Not on file   Number of children: 3   Years of education: Not on file   Highest education level: Not on file  Occupational History   Not on file  Tobacco Use   Smoking status: Former    Types: Cigarettes    Quit date: 11/19/2011    Years since quitting: 11.1   Smokeless tobacco: Never  Vaping Use   Vaping Use: Never used  Substance and Sexual Activity   Alcohol use: No    Alcohol/week: 0.0 standard drinks of alcohol   Drug use: No    Comment: cbd gummies   Sexual activity: Not on file  Other Topics Concern   Not on file  Social History Narrative   Not on file   Social Determinants of Health   Financial Resource Strain: Low Risk  (09/28/2021)   Overall Financial Resource Strain (CARDIA)    Difficulty of Paying Living Expenses: Not very hard  Food Insecurity: Not on file  Transportation Needs: Not on file  Physical Activity:  Not on file  Stress: Not on file  Social Connections: Not on file  Intimate Partner Violence: Not on file      Review of Systems  Constitutional:  Negative for chills, fatigue and unexpected weight change.  HENT:  Negative for congestion, rhinorrhea, sneezing and sore throat.   Eyes:  Negative for redness.  Respiratory:  Negative for cough, chest tightness and shortness of breath.   Cardiovascular:  Negative for chest pain and palpitations.  Gastrointestinal:  Negative for abdominal pain, constipation, diarrhea, nausea and vomiting.  Genitourinary:  Negative for dysuria and frequency.  Musculoskeletal:  Positive for arthralgias. Negative for back pain, joint swelling and  neck pain.  Skin:  Negative for rash.  Neurological: Negative.  Negative for tremors and numbness.  Hematological:  Negative for adenopathy. Does not bruise/bleed easily.  Psychiatric/Behavioral:  Negative for behavioral problems (Depression), sleep disturbance and suicidal ideas. The patient is not nervous/anxious.     Vital Signs: BP (!) 143/87   Pulse 82   Temp 98.3 F (36.8 C)   Resp 16   Ht 5' 11"$  (1.803 m)   Wt 187 lb 12.8 oz (85.2 kg)   SpO2 97%   BMI 26.19 kg/m    Physical Exam Vitals reviewed.  Constitutional:      General: He is not in acute distress.    Appearance: Normal appearance. He is not ill-appearing.  HENT:     Head: Normocephalic and atraumatic.  Eyes:     Pupils: Pupils are equal, round, and reactive to light.  Cardiovascular:     Rate and Rhythm: Normal rate and regular rhythm.  Pulmonary:     Effort: Pulmonary effort is normal. No respiratory distress.  Neurological:     Mental Status: He is alert and oriented to person, place, and time.     Cranial Nerves: No cranial nerve deficit.     Coordination: Coordination normal.     Gait: Gait normal.  Psychiatric:        Mood and Affect: Mood normal.        Behavior: Behavior normal.        Assessment/Plan: 1. Chronic pain of left knee Continue tramadol prn as prescribed. Follow up in 3 months for additional refills.  - traMADol (ULTRAM) 50 MG tablet; Take one tab po tid for knee OA  Dispense: 90 tablet; Refill: 2  2. Primary hypertension Stable, continue current medications as prescribed.   3. Mixed hyperlipidemia Continue rosuvastatin as prescribed.  - rosuvastatin (CRESTOR) 5 MG tablet; Take 1 tablet (5 mg total) by mouth daily.  Dispense: 90 tablet; Refill: 3   General Counseling: Terry Harvey verbalizes understanding of the findings of todays visit and agrees with plan of treatment. I have discussed any further diagnostic evaluation that may be needed or ordered today. We also reviewed his  medications today. he has been encouraged to call the office with any questions or concerns that should arise related to todays visit.    No orders of the defined types were placed in this encounter.   Meds ordered this encounter  Medications   traMADol (ULTRAM) 50 MG tablet    Sig: Take one tab po tid for knee OA    Dispense:  90 tablet    Refill:  2   rosuvastatin (CRESTOR) 5 MG tablet    Sig: Take 1 tablet (5 mg total) by mouth daily.    Dispense:  90 tablet    Refill:  3    For future refills  Return in about 12 weeks (around 03/24/2023) for F/U, pain med refill, Tymeir Weathington PCP.   Total time spent:30 Minutes Time spent includes review of chart, medications, test results, and follow up plan with the patient.   [place] Controlled Substance Database was reviewed by me.  This patient was seen by Jonetta Osgood, FNP-C in collaboration with Dr. Clayborn Bigness as a part of collaborative care agreement.   Corinthian Kemler R. Valetta Fuller, MSN, FNP-C Internal medicine

## 2023-01-03 ENCOUNTER — Other Ambulatory Visit: Payer: Self-pay | Admitting: Orthopedic Surgery

## 2023-01-07 NOTE — Discharge Instructions (Addendum)
Instructions after Total Knee Replacement   James P. Holley Bouche., M.D.     Dept. of [facility]  [place], [place]  [address]  Phone: [phone]   Fax: [phone]    DIET: Drink plenty of non-alcoholic fluids. Resume your normal diet. Include foods high in fiber.  ACTIVITY:  You may use crutches or a walker with weight-bearing as tolerated, unless instructed otherwise. You may be weaned off of the walker or crutches by your Physical Therapist.  Do NOT place pillows under the knee. Anything placed under the knee could limit your ability to straighten the knee.   Continue doing gentle exercises. Exercising will reduce the pain and swelling, increase motion, and prevent muscle weakness.   Please continue to use the TED compression stockings for 6 weeks. You may remove the stockings at night, but should reapply them in the morning. Do not drive or operate any equipment until instructed.  WOUND CARE:  Continue to use the PolarCare or ice packs periodically to reduce pain and swelling. You may bathe or shower after the staples are removed at the first office visit following surgery.  MEDICATIONS: You may resume your regular medications. Please take the pain medication as prescribed on the medication. Do not take pain medication on an empty stomach. You have been given a prescription for a blood thinner (Lovenox or Coumadin). Please take the medication as instructed. (NOTE: After completing a 2 week course of Lovenox, take one Enteric-coated aspirin twice a day. This along with elevation will help reduce the possibility of phlebitis in your operated leg.) Do not drive or drink alcoholic beverages when taking pain medications.  CALL THE OFFICE FOR: Temperature above 101 degrees Excessive bleeding or drainage on the dressing. Excessive swelling, coldness, or paleness of the toes. Persistent nausea and vomiting.  FOLLOW-UP:   You should have an appointment to return to the office in 10-14 days after surgery. Arrangements have been made for continuation of Physical Therapy (either home therapy or outpatient therapy).     [facility] Department Directory         [email or web address]       [email or web address]          Cardiology  Appointments: [place] [phone]  Endocrinology  Appointments: [place] [phone] [place] [phone]  Gastroenterology  Appointments: [place] [phone] [place] [phone]        General Surgery   Appointments: [facility] [place]  Internal Medicine/Family Medicine  Appointments: [facility] [place] - [phone] [place] [phone]  Metabolic and [facility] Loss Surgery  Appointments: [facility]        Neurology  Appointments: [place] [phone] [place] - [phone]  Neurosurgery  Appointments: [place]  Obstetrics & Gynecology  Appointments: [place] [phone] [place] - [phone]        Pediatrics  Appointments: [place] [phone] [place] - [phone]  Physiatry  Appointments: [place] [phone]  Physical Therapy  Appointments: [facility] [phone]        Podiatry  Appointments: [place] [phone] [place] - [phone]  Pulmonology  Appointments: [place]  Rheumatology  Appointments: [place] [phone]        [place] Location: [facility]  [place], [place]  [address]  [place] Location: [facility] [address] [place], [place]  [address]  [place] Location: [facility] [address] [place],   [id number]  POLAR CARE INFORMATION  [email or web address]  How to use [place] Cold Therapy System?  YouTube   [email or web address]  OPERATING  INSTRUCTIONS  Start the product With dry hands, connect the transformer to the electrical connection located on the top of the cooler. Next, plug the transformer into an appropriate electrical outlet. The unit will automatically start running at this point.  To stop the pump, disconnect electrical power.  Unplug to stop the product when not in use. Unplugging the Polar Care unit turns it off. Always unplug immediately after use. Never leave it plugged in while unattended. Remove pad.    FIRST ADD WATER TO FILL LINE, THEN ICE---Replace ice when existing ice is almost melted  1 Discuss Treatment with your [facility] Practitioner and Use Only as Prescribed 2 Apply Insulation Barrier & Cold Therapy Pad 3 Check for Moisture 4 Inspect Skin Regularly  Tips and Trouble Shooting Usage Tips 1. Use cubed or chunked ice for optimal performance. 2. It is recommended to drain the Pad between uses. To drain the pad, hold the Pad upright with the hose pointed toward the ground. Depress the black plunger and allow water to drain out. 3. You may disconnect the Pad from the unit without removing the pad from the affected area by depressing the silver tabs on the hose coupling and gently pulling the hoses apart. The Pad and unit will seal itself and will not leak. Note: Some dripping during release is normal. 4. DO NOT RUN PUMP WITHOUT WATER! The pump in this unit is designed to run with water. Running the unit without water will cause permanent damage to the pump. 5. Unplug unit before removing lid.  TROUBLESHOOTING GUIDE Pump not running, Water not flowing to the pad, Pad is not getting cold 1. Make sure the transformer is plugged into the wall outlet. 2. Confirm that the ice and water are filled to the indicated levels. 3. Make sure there are no kinks in the pad. 4. Gently pull on the blue tube to make sure the tube/pad junction is straight. 5. Remove the pad from the treatment site and ll it  while the pad is lying at; then reapply. 6. Confirm that the pad couplings are securely attached to the unit. Listen for the double clicks (Figure 1) to confirm the pad couplings are securely attached.  Leaks    Note: Some condensation on the lines, controller, and pads is unavoidable, especially in warmer climates. 1. If using a [place] Polar Care Cold Therapy unit with a detachable Cold Therapy Pad, and a leak exists (other than condensation on the lines) disconnect the pad couplings. Make sure the silver tabs on the couplings are depressed before reconnecting the pad to the pump hose; then confirm both sides of the coupling are properly clicked in. 2. If the coupling continues to leak or a leak is detected in the pad itself, stop using it and call [place] at [phone].  Cleaning After use, empty and dry the unit with a soft cloth. Warm water and mild detergent may be used occasionally to clean the pump and tubes.  WARNING: The [place] can be cold enough to cause serious injury, including full skin necrosis. Follow these Operating Instructions, and carefully read the Product Insert (see pouch on side of unit) and the Cold Therapy Pad Fitting Instructions (provided with each Cold Therapy Pad) prior to use.

## 2023-01-08 ENCOUNTER — Other Ambulatory Visit: Payer: Self-pay

## 2023-01-08 ENCOUNTER — Encounter
Admission: RE | Admit: 2023-01-08 | Discharge: 2023-01-08 | Disposition: A | Discharge status: Home / Self Care | Payer: Medicare HMO | Source: Ambulatory Visit | Attending: Orthopedic Surgery | Admitting: Orthopedic Surgery

## 2023-01-08 VITALS — BP 121/76 | HR 69 | Temp 98.4°F | Resp 16 | Ht 71.0 in | Wt 186.0 lb

## 2023-01-08 DIAGNOSIS — Z01810 Encounter for preprocedural cardiovascular examination: Secondary | ICD-10-CM | POA: Diagnosis not present

## 2023-01-08 DIAGNOSIS — R300 Dysuria: Secondary | ICD-10-CM | POA: Diagnosis not present

## 2023-01-08 DIAGNOSIS — Z01812 Encounter for preprocedural laboratory examination: Secondary | ICD-10-CM

## 2023-01-08 DIAGNOSIS — M1712 Unilateral primary osteoarthritis, left knee: Secondary | ICD-10-CM | POA: Insufficient documentation

## 2023-01-08 DIAGNOSIS — Z01818 Encounter for other preprocedural examination: Secondary | ICD-10-CM | POA: Diagnosis not present

## 2023-01-08 LAB — COMPREHENSIVE METABOLIC PANEL
ALT: 25 U/L (ref 0–44)
AST: 26 U/L (ref 15–41)
Albumin: 4.6 g/dL (ref 3.5–5.0)
Alkaline Phosphatase: 57 U/L (ref 38–126)
Anion gap: 6 (ref 5–15)
BUN: 14 mg/dL (ref 8–23)
CO2: 29 mmol/L (ref 22–32)
Calcium: 9.2 mg/dL (ref 8.9–10.3)
Chloride: 101 mmol/L (ref 98–111)
Creatinine, Ser: 0.94 mg/dL (ref 0.61–1.24)
GFR, Estimated: >60 mL/min (ref >60)
Glucose, Bld: 111 mg/dL — ABNORMAL HIGH (ref 70–99)
Potassium: 3.6 mmol/L (ref 3.5–5.1)
Sodium: 136 mmol/L (ref 135–145)
Total Bilirubin: 0.9 mg/dL (ref 0.3–1.2)
Total Protein: 7.7 g/dL (ref 6.5–8.1)

## 2023-01-08 LAB — CBC
HCT: 45.1 % (ref 39.0–52.0)
Hemoglobin: 15.1 g/dL (ref 13.0–17.0)
MCH: 29.9 pg (ref 26.0–34.0)
MCHC: 33.5 g/dL (ref 30.0–36.0)
MCV: 89.3 fL (ref 80.0–100.0)
Platelets: 263 10*3/uL (ref 150–400)
RBC: 5.05 MIL/uL (ref 4.22–5.81)
RDW: 12.4 % (ref 11.5–15.5)
WBC: 6.2 10*3/uL (ref 4.0–10.5)
nRBC: 0 % (ref 0.0–0.2)

## 2023-01-08 LAB — URINALYSIS, ROUTINE W REFLEX MICROSCOPIC
Bacteria, UA: NONE SEEN
Bilirubin Urine: NEGATIVE
Glucose, UA: NEGATIVE mg/dL
Hgb urine dipstick: NEGATIVE
Ketones, ur: NEGATIVE mg/dL
Leukocytes,Ua: NEGATIVE
Nitrite: NEGATIVE
Protein, ur: NEGATIVE mg/dL
Specific Gravity, Urine: 1.006 (ref 1.005–1.030)
Squamous Epithelial / HPF: NONE SEEN /HPF (ref 0–5)
pH: 7 (ref 5.0–8.0)

## 2023-01-08 LAB — TYPE AND SCREEN
ABO/RH(D): A POS
Antibody Screen: NEGATIVE

## 2023-01-08 LAB — SURGICAL PCR SCREEN
MRSA, PCR: NEGATIVE
Staphylococcus aureus: NEGATIVE

## 2023-01-08 LAB — SEDIMENTATION RATE: Sed Rate: 5 mm/hr (ref 0–20)

## 2023-01-08 LAB — C-REACTIVE PROTEIN: CRP: 0.6 mg/dL (ref <1.0)

## 2023-01-08 NOTE — Patient Instructions (Addendum)
Your procedure is scheduled on: Wednesday January 15, 2023. Report to the Registration Desk on the 1st floor of the [facility]. To find out your arrival time, please call [phone] between 1PM - 3PM on: Tuesday January 14, 2023. If your arrival time is 6:00 am, do not arrive before that time as the [facility] entrance doors do not open until 6:00 am.  REMEMBER: Instructions that are not followed completely may result in serious medical risk, up to and including death; or upon the discretion of your surgeon and anesthesiologist your surgery may need to be rescheduled.  Do not eat food after midnight the night before surgery.  No gum chewing or hard candies.  You may however, drink CLEAR liquids up to 2 hours before you are scheduled to arrive for your surgery. Do not drink anything within 2 hours of your scheduled arrival time.  Clear liquids include: - water  - apple juice without pulp - gatorade (not RED colors) - black coffee or tea (Do NOT add milk or creamers to the coffee or tea) Do NOT drink anything that is not on this list.  In addition, your doctor has ordered for you to drink the provided:  Ensure Pre-Surgery Clear Carbohydrate Drink  Gatorade G2 Drinking this carbohydrate drink up to two hours before surgery helps to reduce insulin resistance and improve patient outcomes. Please complete drinking 2 hours before scheduled arrival time.  One week prior to surgery: Stop Anti-inflammatories (NSAIDS) such as Advil, Aleve, Ibuprofen, Motrin, Naproxen, Naprosyn and Aspirin based products such as Excedrin, Goody's Powder, BC Powder. Stop today ALL OVER THE COUNTER supplements until after surgery. You may however, continue to take Tylenol if needed for pain up until the day of surgery.  Continue taking all prescribed medications with the exception of the following:   Follow recommendations from Cardiologist or PCP regarding stopping blood thinners.  TAKE ONLY THESE  MEDICATIONS THE MORNING OF SURGERY WITH A SIP OF WATER:  amLODipine (NORVASC) 5 M  bisoprolol (ZEBETA) 5 MG  rosuvastatin (CRESTOR) 5 MG    No Alcohol for 24 hours before or after surgery.  No Smoking including e-cigarettes for 24 hours before surgery.  No chewable tobacco products for at least 6 hours before surgery.  No nicotine patches on the day of surgery.  Do not use any "recreational" drugs for at least a week (preferably 2 weeks) before your surgery.  Please be advised that the combination of cocaine and anesthesia may have negative outcomes, up to and including death. If you test positive for cocaine, your surgery will be cancelled.  On the morning of surgery brush your teeth with toothpaste and water, you may rinse your mouth with mouthwash if you wish. Do not swallow any toothpaste or mouthwash.  Use CHG Soap or wipes as directed on instruction sheet.  Do not wear jewelry, make-up, hairpins, clips or nail polish.  Do not wear lotions, powders, or perfumes.   Do not shave body hair from the neck down 48 hours before surgery.  Contact lenses, hearing aids and dentures may not be worn into surgery.  Do not bring valuables to the hospital. [facility] is not responsible for any missing/lost belongings or valuables.   Notify your doctor if there is any change in your medical condition (cold, fever, infection).  Wear comfortable clothing (specific to your surgery type) to the hospital.  After surgery, you can help prevent lung complications by doing breathing exercises.  Take deep breaths and cough  every 1-2 hours. Your doctor may order a device called an Incentive Spirometer to help you take deep breaths. When coughing or sneezing, hold a pillow firmly against your incision with both hands. This is called "splinting." Doing this helps protect your incision. It also decreases belly discomfort.  If you are being admitted to the hospital overnight, leave your suitcase in  the car. After surgery it may be brought to your room.  In case of increased patient census, it may be necessary for you, the patient, to continue your postoperative care in the Same Day Surgery department.  If you are being discharged the day of surgery, you will not be allowed to drive home. You will need a responsible individual to drive you home and stay with you for 24 hours after surgery.   If you are taking public transportation, you will need to have a responsible individual with you.  Please call the [place] Dept. at [phone] if you have any questions about these instructions.  Surgery Visitation Policy:  Patients undergoing a surgery or procedure may have two family members or support persons with them as long as the person is not COVID-19 positive or experiencing its symptoms.   Inpatient Visitation:    Visiting hours are 7 a.m. to 8 p.m. Up to four visitors are allowed at one time in a patient room. The visitors may rotate out with other people during the day. One designated support person (adult) may remain overnight.  Due to an increase in RSV and influenza rates and associated hospitalizations, children ages 29 and under will not be able to visit patients in [facility]. Masks continue to be strongly recommended.     Preparing for Surgery with CHLORHEXIDINE GLUCONATE (CHG) Soap  Chlorhexidine Gluconate (CHG) Soap  o An antiseptic cleaner that kills germs and bonds with the skin to continue killing germs even after washing  o Used for showering the night before surgery and morning of surgery  Before surgery, you can play an important role by reducing the number of germs on your skin.  CHG (Chlorhexidine gluconate) soap is an antiseptic cleanser which kills germs and bonds with the skin to continue killing germs even after washing.  Please do not use if you have an allergy to CHG or antibacterial soaps. If your skin becomes  reddened/irritated stop using the CHG.  1. Shower the NIGHT BEFORE SURGERY and the MORNING OF SURGERY with CHG soap.  2. If you choose to wash your hair, wash your hair first as usual with your normal shampoo.  3. After shampooing, rinse your hair and body thoroughly to remove the shampoo.  4. Use CHG as you would any other liquid soap. You can apply CHG directly to the skin and wash gently with a scrungie or a clean washcloth.  5. Apply the CHG soap to your body only from the neck down. Do not use on open wounds or open sores. Avoid contact with your eyes, ears, mouth, and genitals (private parts). Wash face and genitals (private parts) with your normal soap.  6. Wash thoroughly, paying special attention to the area where your surgery will be performed.  7. Thoroughly rinse your body with warm water.  8. Do not shower/wash with your normal soap after using and rinsing off the CHG soap.  9. Pat yourself dry with a clean towel.  10. Wear clean pajamas to bed the night before surgery.  12. Place clean sheets on your bed the night of  your first shower and do not sleep with pets.  13. Shower again with the CHG soap on the day of surgery prior to arriving at the hospital.  14. Do not apply any deodorants/lotions/powders.  15. Please wear clean clothes to the hospital.How to Use an Incentive Spirometer  An incentive spirometer is a tool that measures how well you are filling your lungs with each breath. Learning to take long, deep breaths using this tool can help you keep your lungs clear and active. This may help to reverse or lessen your chance of developing breathing (pulmonary) problems, especially infection. You may be asked to use a spirometer: After a surgery. If you have a lung problem or a history of smoking. After a long period of time when you have been unable to move or be active. If the spirometer includes an indicator to show the highest number that you have reached, your  health care provider or respiratory therapist will help you set a goal. Keep a log of your progress as told by your health care provider. What are the risks? Breathing too quickly may cause dizziness or cause you to pass out. Take your time so you do not get dizzy or light-headed. If you are in pain, you may need to take pain medicine before doing incentive spirometry. It is harder to take a deep breath if you are having pain. How to use your incentive spirometer  Sit up on the edge of your bed or on a chair. Hold the incentive spirometer so that it is in an upright position. Before you use the spirometer, breathe out normally. Place the mouthpiece in your mouth. Make sure your lips are closed tightly around it. Breathe in slowly and as deeply as you can through your mouth, causing the piston or the ball to rise toward the top of the chamber. Hold your breath for 3-5 seconds, or for as long as possible. If the spirometer includes a coach indicator, use this to guide you in breathing. Slow down your breathing if the indicator goes above the marked areas. Remove the mouthpiece from your mouth and breathe out normally. The piston or ball will return to the bottom of the chamber. Rest for a few seconds, then repeat the steps 10 or more times. Take your time and take a few normal breaths between deep breaths so that you do not get dizzy or light-headed. Do this every 1-2 hours when you are awake. If the spirometer includes a goal marker to show the highest number you have reached (best effort), use this as a goal to work toward during each repetition. After each set of 10 deep breaths, cough a few times. This will help to make sure that your lungs are clear. If you have an incision on your chest or abdomen from surgery, place a pillow or a rolled-up towel firmly against the incision when you cough. This can help to reduce pain while taking deep breaths and coughing. General tips When you are able to  get out of bed: Walk around often. Continue to take deep breaths and cough in order to clear your lungs. Keep using the incentive spirometer until your health care provider says it is okay to stop using it. If you have been in the hospital, you may be told to keep using the spirometer at home. Contact a health care provider if: You are having difficulty using the spirometer. You have trouble using the spirometer as often as instructed. Your pain medicine is  not giving enough relief for you to use the spirometer as told. You have a fever. Get help right away if: You develop shortness of breath. You develop a cough with bloody mucus from the lungs. You have fluid or blood coming from an incision site after you cough. Summary An incentive spirometer is a tool that can help you learn to take long, deep breaths to keep your lungs clear and active. You may be asked to use a spirometer after a surgery, if you have a lung problem or a history of smoking, or if you have been inactive for a long period of time. Use your incentive spirometer as instructed every 1-2 hours while you are awake. If you have an incision on your chest or abdomen, place a pillow or a rolled-up towel firmly against your incision when you cough. This will help to reduce pain. Get help right away if you have shortness of breath, you cough up bloody mucus, or blood comes from your incision when you cough. This information is not intended to replace advice given to you by your health care provider. Make sure you discuss any questions you have with your health care provider. Document Revised: 01/24/2020 Document Reviewed: 01/24/2020 Elsevier Patient Education  [place].

## 2023-01-14 MED ORDER — GABAPENTIN 300 MG PO CAPS: 300.0000 mg | ORAL_CAPSULE | Freq: Once | ORAL | Status: AC

## 2023-01-14 MED ORDER — ORAL CARE MOUTH RINSE: 15.0000 mL | Freq: Once | OROMUCOSAL | Status: AC

## 2023-01-14 MED ORDER — CHLORHEXIDINE GLUCONATE 4 % EX LIQD: 60.0000 mL | Freq: Once | CUTANEOUS | Status: DC

## 2023-01-14 MED ORDER — CHLORHEXIDINE GLUCONATE 0.12 % MT SOLN: 15.0000 mL | Freq: Once | OROMUCOSAL | Status: AC

## 2023-01-14 MED ORDER — DEXAMETHASONE SODIUM PHOSPHATE 10 MG/ML IJ SOLN: 8.0000 mg | Freq: Once | INTRAMUSCULAR | Status: AC

## 2023-01-14 MED ORDER — CELECOXIB 200 MG PO CAPS: 400.0000 mg | ORAL_CAPSULE | Freq: Once | ORAL | Status: AC

## 2023-01-14 MED ORDER — CEFAZOLIN SODIUM-DEXTROSE 2-4 GM/100ML-% IV SOLN
2.0000 g | INTRAVENOUS | Status: AC
  Administered 2023-01-15: 2 g via INTRAVENOUS

## 2023-01-14 MED ORDER — TRANEXAMIC ACID-NACL 1000-0.7 MG/100ML-% IV SOLN
1000.0000 mg | INTRAVENOUS | Status: AC
  Administered 2023-01-15: 1000 mg via INTRAVENOUS

## 2023-01-14 MED ORDER — LACTATED RINGERS IV SOLN: INTRAVENOUS | Status: DC

## 2023-01-14 NOTE — H&P (Signed)
ORTHOPAEDIC HISTORY & PHYSICAL Jobeth Pangilinan, Florinda Marker., MD - 12/26/2022 10:00 AM EST Formatting of this note is different from the original. Images from the original note were not included. Chief Complaint: Chief Complaint Patient presents with Left knee degenerative arthrosis  Reason for Visit: The patient is a 72 y.o. male who presents today for reevaluation of his left knee. He has a several year history of left knee pain. He localizes most of the pain along the medial aspect of the knee. He reports no swelling, some locking, and some giving way of the knee. The pain is aggravated by any weight bearing. The knee pain limits the patient's ability to ambulate long distances. The patient has not appreciated any significant improvement despite CBD gummies, NSAIDs, glucosamine/chondroitin, knee brace, intraarticular corticosteroid injections, viscosupplementation, and activity modification. He is not using any ambulatory aids. The patient states that the knee pain has progressed to the point that it is significantly interfering with his activities of daily living.  Medications: Current Outpatient Medications Medication Sig Dispense Refill amLODIPine (NORVASC) 5 MG tablet Take 5 mg by mouth once daily bisoprolol (ZEBETA) 5 MG tablet Take 1 tablet by mouth once daily cannabidiol, CBD, (CANNABIDIOL ORAL) Take 2 Gum by mouth at bedtime 2 gummies every night gabapentin (NEURONTIN) 300 MG capsule Take 1 capsule by mouth at bedtime as needed glucosamine-chondroitin 250-200 mg Tab Take by mouth. hydroCHLOROthiazide (HYDRODIURIL) 12.5 MG tablet Take 0.5 tablets by mouth once daily multivitamin tablet Take by mouth. naproxen sodium (ALEVE, ANAPROX) 220 MG tablet Take by mouth. rosuvastatin (CRESTOR) 5 MG tablet Take 1 tablet by mouth once daily traMADoL (ULTRAM) 50 mg tablet Take 1 tablet by mouth 3 (three) times a day  No current facility-administered medications for this  visit.  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  Social History: Social History  Socioeconomic History Marital status: Widowed Number of children: 3 Years of education: 12 Highest education level: High school graduate Occupational History Occupation: Herbalist Tobacco Use Smoking status: Former Smokeless tobacco: Never Tobacco comments: quit 5 years ago Scientist, research (physical sciences) Use: Never used Substance and Sexual Activity Alcohol use: No Drug use: No  Family History: Family History Problem Relation Age of Onset No Known Problems Mother  Review of Systems: A comprehensive 14 point ROS was performed, reviewed, and the pertinent orthopaedic findings are documented in the HPI.  Exam BP (!) 140/86  Ht 180.3 cm ('5\' 11"'$ )  Wt 84.5 kg (186 lb 3.2 oz)  BMI 25.97 kg/m  General: Well-developed, well-nourished male seen in no acute distress. Antalgic gait. Varus thrust to the left knee.  HEENT: Atraumatic, normocephalic. Pupils are equal and reactive to light. Extraocular motion is intact. Sclera are clear. Oropharynx is clear with moist mucosa.  Neck: Supple, nontender, and with good ROM. No thyromegaly, adenopathy, JVD, or carotid bruits.  Lungs: Clear to auscultation bilaterally.  Cardiovascular: Regular rate and rhythm. Normal S1, S2. No murmur . No appreciable gallops or rubs. Peripheral pulses are palpable. No lower extremity edema. Homan`s test is negative.  Abdomen: Soft, nontender, nondistended. Bowel sounds are present.  Extremities: Good strength, stability, and range of motion of the upper  extremities. Good range of motion of the hips and ankles.  Left Knee: Soft tissue  swelling: none Effusion: none Erythema: none Crepitance: mild Tenderness: medial Alignment: relative varus Mediolateral laxity: medial pseudolaxity Posterior sag: negative Patellar tracking: Good tracking without evidence of subluxation or tilt Atrophy: No significant atrophy. Quadriceps tone was good. Range of motion: 0/0/127 degrees  Neurologic: Awake, alert, and oriented. Sensory function is intact to pinprick and light touch. Motor strength is judged to be 5/5. Motor coordination is within normal limits. No apparent clonus. No tremor.  X-rays: I ordered and interpreted standing AP, lateral, and sunrise radiographs of the left knee that were obtained in the office today. There is significant narrowing of the medial cartilage space with bone-on-bone articulation and associated varus alignment. Osteophyte formation is noted. Subchondral sclerosis is noted. No evidence of fracture or dislocation.  Impression: Degenerative arthrosis of the left knee  Plan: The findings were discussed in detail with the patient. The patient was given informational material on total knee replacement. Conservative treatment options were reviewed with the patient. We discussed the risks and benefits of surgical intervention. The usual perioperative course was also discussed in detail. The patient expressed understanding of the risks and benefits of surgical intervention and would like to proceed with plans for left total knee arthroplasty.  I spent a total of 40 minutes in both face-to-face and non-face-to-face activities, excluding procedures performed, for this visit on the date of this encounter.  MEDICAL CLEARANCE: Per anesthesiology. ACTIVITY: As tolerated. WORK STATUS: Not applicable. THERAPY: Preoperative physical therapy evaluation. MEDICATIONS: Requested Prescriptions  No prescriptions requested or  ordered in this encounter  FOLLOW-UP: Return for preop History & Physical pending surgery date.  Ramsey Guadamuz P. Holley Bouche., M.D.  This note was generated in part with voice recognition software and I apologize for any typographical errors that were not detected and corrected.  Electronically signed by Lamar Benes., MD at 12/29/2022 6:58 AM EST

## 2023-01-15 ENCOUNTER — Observation Stay: Payer: Medicare HMO

## 2023-01-15 ENCOUNTER — Ambulatory Visit: Payer: Medicare HMO | Admitting: Certified Registered"

## 2023-01-15 ENCOUNTER — Encounter
Admission: RE | Disposition: A | Discharge status: Home / Self Care | Payer: Self-pay | Source: Home / Self Care | Attending: Orthopedic Surgery

## 2023-01-15 ENCOUNTER — Encounter: Payer: Self-pay | Admitting: Orthopedic Surgery

## 2023-01-15 ENCOUNTER — Other Ambulatory Visit: Payer: Self-pay

## 2023-01-15 ENCOUNTER — Observation Stay
Admission: RE | Admit: 2023-01-15 | Discharge: 2023-01-16 | Disposition: A | Discharge status: Home / Self Care | Payer: Medicare HMO | Attending: Orthopedic Surgery | Admitting: Orthopedic Surgery

## 2023-01-15 ENCOUNTER — Ambulatory Visit: Payer: Medicare HMO | Admitting: Urgent Care

## 2023-01-15 DIAGNOSIS — M1712 Unilateral primary osteoarthritis, left knee: Secondary | ICD-10-CM | POA: Diagnosis not present

## 2023-01-15 DIAGNOSIS — I10 Essential (primary) hypertension: Secondary | ICD-10-CM | POA: Insufficient documentation

## 2023-01-15 DIAGNOSIS — R300 Dysuria: Secondary | ICD-10-CM

## 2023-01-15 DIAGNOSIS — Z79899 Other long term (current) drug therapy: Secondary | ICD-10-CM | POA: Diagnosis not present

## 2023-01-15 DIAGNOSIS — Z471 Aftercare following joint replacement surgery: Secondary | ICD-10-CM | POA: Diagnosis not present

## 2023-01-15 DIAGNOSIS — Z96659 Presence of unspecified artificial knee joint: Secondary | ICD-10-CM

## 2023-01-15 DIAGNOSIS — Z87891 Personal history of nicotine dependence: Secondary | ICD-10-CM | POA: Insufficient documentation

## 2023-01-15 DIAGNOSIS — Z96642 Presence of left artificial hip joint: Secondary | ICD-10-CM | POA: Diagnosis not present

## 2023-01-15 DIAGNOSIS — E785 Hyperlipidemia, unspecified: Secondary | ICD-10-CM | POA: Diagnosis not present

## 2023-01-15 DIAGNOSIS — Z96652 Presence of left artificial knee joint: Secondary | ICD-10-CM | POA: Diagnosis not present

## 2023-01-15 HISTORY — PX: KNEE ARTHROPLASTY: SHX992

## 2023-01-15 LAB — ABO/RH: ABO/RH(D): A POS

## 2023-01-15 SURGERY — ARTHROPLASTY, KNEE, TOTAL, USING IMAGELESS COMPUTER-ASSISTED NAVIGATION
Anesthesia: Spinal | Site: Knee | Laterality: Left

## 2023-01-15 MED ORDER — CHLORHEXIDINE GLUCONATE 0.12 % MT SOLN
OROMUCOSAL | Status: AC
  Administered 2023-01-15: 15 mL via OROMUCOSAL
  Filled 2023-01-15: qty 15

## 2023-01-15 MED ORDER — TRAMADOL HCL 50 MG PO TABS
50.0000 mg | ORAL_TABLET | ORAL | Status: DC | PRN
  Administered 2023-01-15: 50 mg via ORAL

## 2023-01-15 MED ORDER — CEFAZOLIN SODIUM-DEXTROSE 2-4 GM/100ML-% IV SOLN
2.0000 g | Freq: Four times a day (QID) | INTRAVENOUS | Status: AC
  Administered 2023-01-15 – 2023-01-16 (×2): 2 g via INTRAVENOUS
  Filled 2023-01-15 (×2): qty 100

## 2023-01-15 MED ORDER — ONDANSETRON HCL 4 MG/2ML IJ SOLN: 4.0000 mg | Freq: Once | INTRAMUSCULAR | Status: DC | PRN

## 2023-01-15 MED ORDER — CEFAZOLIN SODIUM-DEXTROSE 2-4 GM/100ML-% IV SOLN
INTRAVENOUS | Status: AC
  Filled 2023-01-15: qty 100

## 2023-01-15 MED ORDER — ONDANSETRON HCL 4 MG/2ML IJ SOLN: 4.0000 mg | Freq: Four times a day (QID) | INTRAMUSCULAR | Status: DC | PRN

## 2023-01-15 MED ORDER — METOCLOPRAMIDE HCL 10 MG PO TABS
10.0000 mg | ORAL_TABLET | Freq: Three times a day (TID) | ORAL | Status: DC
  Administered 2023-01-15: 10 mg via ORAL

## 2023-01-15 MED ORDER — GLYCOPYRROLATE 0.2 MG/ML IJ SOLN
INTRAMUSCULAR | Status: DC | PRN
  Administered 2023-01-15: .2 mg via INTRAVENOUS

## 2023-01-15 MED ORDER — OXYCODONE HCL 5 MG PO TABS
ORAL_TABLET | ORAL | Status: AC
  Filled 2023-01-15: qty 1

## 2023-01-15 MED ORDER — MIDAZOLAM HCL 2 MG/2ML IJ SOLN
INTRAMUSCULAR | Status: AC
  Filled 2023-01-15: qty 2

## 2023-01-15 MED ORDER — ACETAMINOPHEN 10 MG/ML IV SOLN
INTRAVENOUS | Status: DC | PRN
  Administered 2023-01-15: 1000 mg via INTRAVENOUS

## 2023-01-15 MED ORDER — FENTANYL CITRATE (PF) 100 MCG/2ML IJ SOLN
INTRAMUSCULAR | Status: DC | PRN
  Administered 2023-01-15 (×2): 50 ug via INTRAVENOUS

## 2023-01-15 MED ORDER — ACETAMINOPHEN 10 MG/ML IV SOLN
1000.0000 mg | Freq: Four times a day (QID) | INTRAVENOUS | Status: DC
  Administered 2023-01-15 – 2023-01-16 (×3): 1000 mg via INTRAVENOUS

## 2023-01-15 MED ORDER — SODIUM CHLORIDE FLUSH 0.9 % IV SOLN
INTRAVENOUS | Status: AC
  Filled 2023-01-15: qty 40

## 2023-01-15 MED ORDER — ACETAMINOPHEN 10 MG/ML IV SOLN
INTRAVENOUS | Status: AC
  Filled 2023-01-15: qty 100

## 2023-01-15 MED ORDER — OXYCODONE HCL 5 MG PO TABS
ORAL_TABLET | ORAL | Status: AC
  Filled 2023-01-15: qty 2

## 2023-01-15 MED ORDER — TRANEXAMIC ACID-NACL 1000-0.7 MG/100ML-% IV SOLN
INTRAVENOUS | Status: AC
  Filled 2023-01-15: qty 100

## 2023-01-15 MED ORDER — PROPOFOL 10 MG/ML IV BOLUS
INTRAVENOUS | Status: AC
  Filled 2023-01-15: qty 60

## 2023-01-15 MED ORDER — FENTANYL CITRATE (PF) 100 MCG/2ML IJ SOLN
INTRAMUSCULAR | Status: AC
  Filled 2023-01-15: qty 2

## 2023-01-15 MED ORDER — PROPOFOL 1000 MG/100ML IV EMUL
INTRAVENOUS | Status: AC
  Filled 2023-01-15: qty 100

## 2023-01-15 MED ORDER — ACETAMINOPHEN 325 MG PO TABS: 325.0000 mg | ORAL_TABLET | Freq: Four times a day (QID) | ORAL | Status: DC | PRN

## 2023-01-15 MED ORDER — CELECOXIB 200 MG PO CAPS
200.0000 mg | ORAL_CAPSULE | Freq: Two times a day (BID) | ORAL | Status: DC
  Administered 2023-01-15: 200 mg via ORAL

## 2023-01-15 MED ORDER — OXYCODONE HCL 5 MG PO TABS
10.0000 mg | ORAL_TABLET | ORAL | Status: DC | PRN
  Administered 2023-01-15 – 2023-01-16 (×2): 10 mg via ORAL

## 2023-01-15 MED ORDER — FLEET ENEMA 7-19 GM/118ML RE ENEM: 1.0000 | ENEMA | Freq: Once | RECTAL | Status: DC | PRN

## 2023-01-15 MED ORDER — CELECOXIB 200 MG PO CAPS
ORAL_CAPSULE | ORAL | Status: AC
  Filled 2023-01-15: qty 1

## 2023-01-15 MED ORDER — MENTHOL 3 MG MT LOZG
1.0000 | LOZENGE | OROMUCOSAL | Status: DC | PRN
  Filled 2023-01-15: qty 9

## 2023-01-15 MED ORDER — PROPOFOL 10 MG/ML IV BOLUS
INTRAVENOUS | Status: DC | PRN
  Administered 2023-01-15: 30 mg via INTRAVENOUS

## 2023-01-15 MED ORDER — SODIUM CHLORIDE (PF) 0.9 % IJ SOLN
INTRAMUSCULAR | Status: DC | PRN
  Administered 2023-01-15: 120 mL via INTRAMUSCULAR

## 2023-01-15 MED ORDER — BUPIVACAINE HCL (PF) 0.5 % IJ SOLN
INTRAMUSCULAR | Status: AC
  Filled 2023-01-15: qty 10

## 2023-01-15 MED ORDER — HYDROCHLOROTHIAZIDE 25 MG PO TABS: 12.5000 mg | ORAL_TABLET | Freq: Every day | ORAL | Status: DC

## 2023-01-15 MED ORDER — ROSUVASTATIN CALCIUM 5 MG PO TABS
5.0000 mg | ORAL_TABLET | Freq: Every day | ORAL | Status: DC
  Administered 2023-01-16: 5 mg via ORAL
  Filled 2023-01-15: qty 1

## 2023-01-15 MED ORDER — ONDANSETRON HCL 4 MG/2ML IJ SOLN
INTRAMUSCULAR | Status: DC | PRN
  Administered 2023-01-15: 4 mg via INTRAVENOUS

## 2023-01-15 MED ORDER — SURGIPHOR WOUND IRRIGATION SYSTEM - OPTIME: TOPICAL | Status: DC | PRN

## 2023-01-15 MED ORDER — CELECOXIB 200 MG PO CAPS
ORAL_CAPSULE | ORAL | Status: AC
  Administered 2023-01-15: 400 mg via ORAL
  Filled 2023-01-15: qty 2

## 2023-01-15 MED ORDER — SODIUM CHLORIDE 0.9 % IV SOLN: INTRAVENOUS | Status: DC

## 2023-01-15 MED ORDER — TRANEXAMIC ACID-NACL 1000-0.7 MG/100ML-% IV SOLN
INTRAVENOUS | Status: AC
  Administered 2023-01-15: 1000 mg via INTRAVENOUS
  Filled 2023-01-15: qty 100

## 2023-01-15 MED ORDER — BISOPROLOL FUMARATE 5 MG PO TABS
5.0000 mg | ORAL_TABLET | Freq: Every day | ORAL | Status: DC
  Administered 2023-01-16: 5 mg via ORAL
  Filled 2023-01-15: qty 1

## 2023-01-15 MED ORDER — MAGNESIUM HYDROXIDE 400 MG/5ML PO SUSP
30.0000 mL | Freq: Every day | ORAL | Status: DC
  Administered 2023-01-15: 30 mL via ORAL

## 2023-01-15 MED ORDER — DIPHENHYDRAMINE HCL 25 MG PO CAPS
ORAL_CAPSULE | ORAL | Status: AC
  Filled 2023-01-15: qty 1

## 2023-01-15 MED ORDER — ENSURE PRE-SURGERY PO LIQD: 296.0000 mL | Freq: Once | ORAL | Status: DC

## 2023-01-15 MED ORDER — BISACODYL 10 MG RE SUPP: 10.0000 mg | Freq: Every day | RECTAL | Status: DC | PRN

## 2023-01-15 MED ORDER — MIDAZOLAM HCL 5 MG/5ML IJ SOLN
INTRAMUSCULAR | Status: DC | PRN
  Administered 2023-01-15: 2 mg via INTRAVENOUS

## 2023-01-15 MED ORDER — SENNOSIDES-DOCUSATE SODIUM 8.6-50 MG PO TABS
1.0000 | ORAL_TABLET | Freq: Two times a day (BID) | ORAL | Status: DC
  Administered 2023-01-15: 1 via ORAL

## 2023-01-15 MED ORDER — GABAPENTIN 300 MG PO CAPS
ORAL_CAPSULE | ORAL | Status: AC
  Administered 2023-01-15: 300 mg via ORAL
  Filled 2023-01-15: qty 1

## 2023-01-15 MED ORDER — DIPHENHYDRAMINE HCL 12.5 MG/5ML PO ELIX
12.5000 mg | ORAL_SOLUTION | ORAL | Status: DC | PRN
  Administered 2023-01-15: 25 mg via ORAL

## 2023-01-15 MED ORDER — HYDROMORPHONE HCL 1 MG/ML IJ SOLN: 0.5000 mg | INTRAMUSCULAR | Status: DC | PRN

## 2023-01-15 MED ORDER — DEXAMETHASONE SODIUM PHOSPHATE 10 MG/ML IJ SOLN
INTRAMUSCULAR | Status: AC
  Administered 2023-01-15: 8 mg via INTRAVENOUS
  Filled 2023-01-15: qty 1

## 2023-01-15 MED ORDER — TRANEXAMIC ACID-NACL 1000-0.7 MG/100ML-% IV SOLN: 1000.0000 mg | Freq: Once | INTRAVENOUS | Status: AC

## 2023-01-15 MED ORDER — GABAPENTIN 300 MG PO CAPS
ORAL_CAPSULE | ORAL | Status: AC
  Filled 2023-01-15: qty 1

## 2023-01-15 MED ORDER — GABAPENTIN 300 MG PO CAPS
300.0000 mg | ORAL_CAPSULE | Freq: Every day | ORAL | Status: DC
  Administered 2023-01-15: 300 mg via ORAL

## 2023-01-15 MED ORDER — BUPIVACAINE HCL (PF) 0.25 % IJ SOLN
INTRAMUSCULAR | Status: AC
  Filled 2023-01-15: qty 60

## 2023-01-15 MED ORDER — LIDOCAINE HCL URETHRAL/MUCOSAL 2 % EX GEL
CUTANEOUS | Status: AC
  Filled 2023-01-15: qty 6

## 2023-01-15 MED ORDER — BUPIVACAINE HCL (PF) 0.5 % IJ SOLN
INTRAMUSCULAR | Status: DC | PRN
  Administered 2023-01-15: 2.6 mL via INTRATHECAL

## 2023-01-15 MED ORDER — BUPIVACAINE LIPOSOME 1.3 % IJ SUSP
INTRAMUSCULAR | Status: AC
  Filled 2023-01-15: qty 20

## 2023-01-15 MED ORDER — SENNOSIDES-DOCUSATE SODIUM 8.6-50 MG PO TABS
ORAL_TABLET | ORAL | Status: AC
  Filled 2023-01-15: qty 1

## 2023-01-15 MED ORDER — AMLODIPINE BESYLATE 5 MG PO TABS: 5.0000 mg | ORAL_TABLET | Freq: Every day | ORAL | Status: DC

## 2023-01-15 MED ORDER — LORATADINE 10 MG PO TABS: 10.0000 mg | ORAL_TABLET | Freq: Every day | ORAL | Status: DC

## 2023-01-15 MED ORDER — PROPOFOL 500 MG/50ML IV EMUL
INTRAVENOUS | Status: DC | PRN
  Administered 2023-01-15: 50 ug/kg/min via INTRAVENOUS

## 2023-01-15 MED ORDER — ENOXAPARIN SODIUM 30 MG/0.3ML IJ SOSY: 30.0000 mg | PREFILLED_SYRINGE | Freq: Two times a day (BID) | INTRAMUSCULAR | Status: DC

## 2023-01-15 MED ORDER — PROPOFOL 10 MG/ML IV BOLUS
INTRAVENOUS | Status: AC
  Filled 2023-01-15: qty 20

## 2023-01-15 MED ORDER — ONDANSETRON HCL 4 MG/2ML IJ SOLN
INTRAMUSCULAR | Status: AC
  Filled 2023-01-15: qty 2

## 2023-01-15 MED ORDER — PANTOPRAZOLE SODIUM 40 MG PO TBEC
40.0000 mg | DELAYED_RELEASE_TABLET | Freq: Two times a day (BID) | ORAL | Status: DC
  Administered 2023-01-15: 40 mg via ORAL

## 2023-01-15 MED ORDER — PANTOPRAZOLE SODIUM 40 MG PO TBEC
DELAYED_RELEASE_TABLET | ORAL | Status: AC
  Filled 2023-01-15: qty 1

## 2023-01-15 MED ORDER — PHENOL 1.4 % MT LIQD
1.0000 | OROMUCOSAL | Status: DC | PRN
  Filled 2023-01-15: qty 177

## 2023-01-15 MED ORDER — TRAMADOL HCL 50 MG PO TABS
ORAL_TABLET | ORAL | Status: AC
  Filled 2023-01-15: qty 1

## 2023-01-15 MED ORDER — ONDANSETRON HCL 4 MG PO TABS: 4.0000 mg | ORAL_TABLET | Freq: Four times a day (QID) | ORAL | Status: DC | PRN

## 2023-01-15 MED ORDER — OXYCODONE HCL 5 MG PO TABS
5.0000 mg | ORAL_TABLET | ORAL | Status: DC | PRN
  Administered 2023-01-15 – 2023-01-16 (×2): 5 mg via ORAL

## 2023-01-15 MED ORDER — MAGNESIUM HYDROXIDE 400 MG/5ML PO SUSP
ORAL | Status: AC
  Filled 2023-01-15: qty 30

## 2023-01-15 MED ORDER — DEXMEDETOMIDINE HCL IN NACL 200 MCG/50ML IV SOLN
INTRAVENOUS | Status: DC | PRN
  Administered 2023-01-15: 12 ug via INTRAVENOUS

## 2023-01-15 MED ORDER — METOCLOPRAMIDE HCL 10 MG PO TABS
ORAL_TABLET | ORAL | Status: AC
  Filled 2023-01-15: qty 1

## 2023-01-15 MED ORDER — BISOPROLOL FUMARATE 5 MG PO TABS
5.0000 mg | ORAL_TABLET | Freq: Every day | ORAL | Status: DC
  Filled 2023-01-15: qty 1

## 2023-01-15 MED ORDER — FENTANYL CITRATE (PF) 100 MCG/2ML IJ SOLN: 25.0000 ug | INTRAMUSCULAR | Status: DC | PRN

## 2023-01-15 MED ORDER — ALUM & MAG HYDROXIDE-SIMETH 200-200-20 MG/5ML PO SUSP: 30.0000 mL | ORAL | Status: DC | PRN

## 2023-01-15 MED ORDER — SODIUM CHLORIDE 0.9 % IR SOLN
Status: DC | PRN
  Administered 2023-01-15: 3000 mL

## 2023-01-15 SURGICAL SUPPLY — 73 items
ATTUNE MED DOME PAT 41 KNEE (Knees) IMPLANT
ATTUNE PS FEM LT SZ 6 CEM KNEE (Femur) IMPLANT
ATTUNE PSRP INSR SZ6 5 KNEE (Insert) IMPLANT
BASE TIBIAL ROT PLAT SZ 8 KNEE (Knees) IMPLANT
BATTERY INSTRU NAVIGATION (MISCELLANEOUS) ×4 IMPLANT
BLADE CLIPPER SURG (BLADE) IMPLANT
BLADE SAW 70X12.5 (BLADE) ×1 IMPLANT
BLADE SAW 90X13X1.19 OSCILLAT (BLADE) ×1 IMPLANT
BLADE SAW 90X25X1.19 OSCILLAT (BLADE) ×1 IMPLANT
BONE CEMENT GENTAMICIN (Cement) ×2 IMPLANT
BSPLAT TIB 8 CMNT ROT PLAT STR (Knees) ×1 IMPLANT
BTRY SRG DRVR LF (MISCELLANEOUS) ×4
CEMENT BONE GENTAMICIN 40 (Cement) IMPLANT
COOLER POLAR GLACIER W/PUMP (MISCELLANEOUS) ×1 IMPLANT
CUFF TOURN SGL QUICK 24 (TOURNIQUET CUFF)
CUFF TOURN SGL QUICK 34 (TOURNIQUET CUFF)
CUFF TRNQT CYL 24X4X16.5-23 (TOURNIQUET CUFF) IMPLANT
CUFF TRNQT CYL 34X4.125X (TOURNIQUET CUFF) IMPLANT
DRAPE 3/4 80X56 (DRAPES) ×1 IMPLANT
DRAPE INCISE IOBAN 66X45 STRL (DRAPES) IMPLANT
DRSG MEPILEX SACRM 8.7X9.8 (GAUZE/BANDAGES/DRESSINGS) ×1 IMPLANT
DRSG NON-ADHERENT DERMACEA 3X4 (GAUZE/BANDAGES/DRESSINGS) ×1 IMPLANT
DRSG OPSITE POSTOP 4X14 (GAUZE/BANDAGES/DRESSINGS) ×1 IMPLANT
DRSG TEGADERM 4X4.75 (GAUZE/BANDAGES/DRESSINGS) ×1 IMPLANT
DURAPREP 26ML APPLICATOR (WOUND CARE) ×2 IMPLANT
ELECT CAUTERY BLADE 6.4 (BLADE) ×1 IMPLANT
ELECT REM PT RETURN 9FT ADLT (ELECTROSURGICAL) ×1
ELECTRODE REM PT RTRN 9FT ADLT (ELECTROSURGICAL) ×1 IMPLANT
EX-PIN ORTHOLOCK NAV 4X150 (PIN) ×2 IMPLANT
GLOVE BIOGEL M STRL SZ7.5 (GLOVE) ×2 IMPLANT
GLOVE SRG 8 PF TXTR STRL LF DI (GLOVE) ×1 IMPLANT
GLOVE SURG UNDER POLY LF SZ8 (GLOVE) ×1
GOWN STRL REUS W/ TWL LRG LVL3 (GOWN DISPOSABLE) ×1 IMPLANT
GOWN STRL REUS W/TWL LRG LVL3 (GOWN DISPOSABLE) ×1
GOWN TOGA ZIPPER T7+ PEEL AWAY (MISCELLANEOUS) ×1 IMPLANT
HEMOVAC 400CC 10FR (MISCELLANEOUS) ×1 IMPLANT
HOLDER FOLEY CATH W/STRAP (MISCELLANEOUS) ×1 IMPLANT
HOOD PEEL AWAY T7 (MISCELLANEOUS) IMPLANT
IV NS IRRIG 3000ML ARTHROMATIC (IV SOLUTION) ×1 IMPLANT
KIT TURNOVER KIT A (KITS) ×1 IMPLANT
KNIFE SCULPS 14X20 (INSTRUMENTS) ×1 IMPLANT
MANIFOLD NEPTUNE II (INSTRUMENTS) ×2 IMPLANT
NDL SPNL 20GX3.5 QUINCKE YW (NEEDLE) ×2 IMPLANT
NEEDLE SPNL 20GX3.5 QUINCKE YW (NEEDLE) ×2 IMPLANT
NS IRRIG 500ML POUR BTL (IV SOLUTION) ×1 IMPLANT
PACK TOTAL KNEE (MISCELLANEOUS) ×1 IMPLANT
PAD ABD DERMACEA PRESS 5X9 (GAUZE/BANDAGES/DRESSINGS) ×2 IMPLANT
PAD WRAPON POLAR KNEE (MISCELLANEOUS) ×1 IMPLANT
PIN DRILL FIX HALF THREAD (BIT) ×2 IMPLANT
PIN FIXATION 1/8DIA X 3INL (PIN) ×1 IMPLANT
PULSAVAC PLUS IRRIG FAN TIP (DISPOSABLE) ×1
SOL PREP PVP 2OZ (MISCELLANEOUS) ×1
SOLUTION IRRIG SURGIPHOR (IV SOLUTION) ×1 IMPLANT
SOLUTION PREP PVP 2OZ (MISCELLANEOUS) ×1 IMPLANT
SPONGE DRAIN TRACH 4X4 STRL 2S (GAUZE/BANDAGES/DRESSINGS) ×1 IMPLANT
SPONGE T-LAP 18X18 ~~LOC~~+RFID (SPONGE) IMPLANT
STAPLER SKIN PROX 35W (STAPLE) ×1 IMPLANT
STOCKINETTE IMPERV 14X48 (MISCELLANEOUS) ×1 IMPLANT
STRAP TIBIA SHORT (MISCELLANEOUS) ×1 IMPLANT
SUCTION FRAZIER HANDLE 10FR (MISCELLANEOUS) ×1
SUCTION TUBE FRAZIER 10FR DISP (MISCELLANEOUS) ×1 IMPLANT
SUT VIC AB 0 CT1 36 (SUTURE) ×1 IMPLANT
SUT VIC AB 1 CT1 36 (SUTURE) ×2 IMPLANT
SUT VIC AB 2-0 CT2 27 (SUTURE) ×1 IMPLANT
SYR 30ML LL (SYRINGE) ×2 IMPLANT
TIBIAL BASE ROT PLAT SZ 8 KNEE (Knees) ×1 IMPLANT
TIP FAN IRRIG PULSAVAC PLUS (DISPOSABLE) ×1 IMPLANT
TOWEL OR 17X26 4PK STRL BLUE (TOWEL DISPOSABLE) IMPLANT
TOWER CARTRIDGE SMART MIX (DISPOSABLE) ×1 IMPLANT
TRAP FLUID SMOKE EVACUATOR (MISCELLANEOUS) ×1 IMPLANT
TRAY FOLEY MTR SLVR 16FR STAT (SET/KITS/TRAYS/PACK) ×1 IMPLANT
WATER STERILE IRR 1000ML POUR (IV SOLUTION) ×1 IMPLANT
WRAPON POLAR PAD KNEE (MISCELLANEOUS) ×1

## 2023-01-15 NOTE — Anesthesia Procedure Notes (Signed)
Spinal  Patient location during procedure: OR Reason for block: surgical anesthesia Staffing Performed: resident/CRNA  Anesthesiologist: Molli Barrows, MD Resident/CRNA: Rolla Plate, CRNA Performed by: Rolla Plate, CRNA Authorized by: Molli Barrows, MD   Preanesthetic Checklist Completed: patient identified, IV checked, site marked, risks and benefits discussed, surgical consent, monitors and equipment checked, pre-op evaluation and timeout performed Spinal Block Patient position: sitting Prep: ChloraPrep and site prepped and draped Patient monitoring: heart rate, continuous pulse ox, blood pressure and cardiac monitor Approach: midline Location: L4-5 Injection technique: single-shot Needle Needle type: Whitacre and Introducer  Needle gauge: 24 G Needle length: 9 cm Assessment Events: CSF return Additional Notes Negative paresthesia. Negative blood return. Positive free-flowing CSF. Expiration date of kit checked and confirmed. Patient tolerated procedure well, without complications.

## 2023-01-15 NOTE — Anesthesia Preprocedure Evaluation (Signed)
Anesthesia Evaluation  Patient identified by MRN, date of birth, ID band Patient awake    Reviewed: Allergy & Precautions, H&P , NPO status , Patient's Chart, lab work & pertinent test results, reviewed documented beta blocker date and time   Airway Mallampati: II   Neck ROM: full    Dental  (+) Poor Dentition   Pulmonary neg pulmonary ROS, former smoker   Pulmonary exam normal        Cardiovascular Exercise Tolerance: Good hypertension, On Medications negative cardio ROS Normal cardiovascular exam Rhythm:regular Rate:Normal     Neuro/Psych negative neurological ROS  negative psych ROS   GI/Hepatic negative GI ROS, Neg liver ROS,,,  Endo/Other  negative endocrine ROS    Renal/GU negative Renal ROS  negative genitourinary   Musculoskeletal   Abdominal   Peds  Hematology negative hematology ROS (+)   Anesthesia Other Findings Past Medical History: No date: Hyperlipidemia No date: Hypertension No date: Insomnia No date: Osteoarthritis Past Surgical History: 2009: APPENDECTOMY 11/12/2018: INGUINAL HERNIA REPAIR; Right     Comment:  Procedure: HERNIA REPAIR INGUINAL ADULT;  Surgeon:               Olean Ree, MD;  Location: [facility] ORS;  Service:               General;  Laterality: Right; 2002: TUMOR REMOVAL     Comment:  from right side of neck BMI    Body Mass Index: 25.80 kg/m     Reproductive/Obstetrics negative OB ROS                             Anesthesia Physical Anesthesia Plan  ASA: 3  Anesthesia Plan: Spinal   Post-op Pain Management:    Induction:   PONV Risk Score and Plan:   Airway Management Planned:   Additional Equipment:   Intra-op Plan:   Post-operative Plan:   Informed Consent: I have reviewed the patients History and Physical, chart, labs and discussed the procedure including the risks, benefits and alternatives for the proposed anesthesia with  the patient or authorized representative who has indicated his/her understanding and acceptance.     Dental Advisory Given  Plan Discussed with: CRNA  Anesthesia Plan Comments:        Anesthesia Quick Evaluation

## 2023-01-15 NOTE — Op Note (Signed)
OPERATIVE NOTE  DATE OF SURGERY:  01/15/2023  PATIENT NAME:  Terry Harvey.   DOB: Nov 08, 1951  MRN: [id number]  PRE-OPERATIVE DIAGNOSIS: Degenerative arthrosis of the left knee, primary  POST-OPERATIVE DIAGNOSIS:  Same  PROCEDURE:  Left total knee arthroplasty using computer-assisted navigation  SURGEON:  Marciano Sequin. M.D.  ANESTHESIA: spinal  ESTIMATED BLOOD LOSS: 75 mL  FLUIDS REPLACED: 1200 mL of crystalloid  TOURNIQUET TIME: 88 minutes  DRAINS: 2 medium Hemovac drains  SOFT TISSUE RELEASES: Anterior cruciate ligament, posterior cruciate ligament, deep medial collateral ligament, patellofemoral ligament  IMPLANTS UTILIZED: DePuy Attune size 6 posterior stabilized femoral component (cemented), size 8 rotating platform tibial component (cemented), 41 mm medialized dome patella (cemented), and a 5 mm stabilized rotating platform polyethylene insert.  INDICATIONS FOR SURGERY: Terry Harvey. is a 72 y.o. year old male with a long history of progressive knee pain. X-rays demonstrated severe degenerative changes in tricompartmental fashion. The patient had not seen any significant improvement despite conservative nonsurgical intervention. After discussion of the risks and benefits of surgical intervention, the patient expressed understanding of the risks benefits and agree with plans for total knee arthroplasty.   The risks, benefits, and alternatives were discussed at length including but not limited to the risks of infection, bleeding, nerve injury, stiffness, blood clots, the need for revision surgery, cardiopulmonary complications, among others, and they were willing to proceed.  PROCEDURE IN DETAIL: The patient was brought into the operating room and, after adequate spinal anesthesia was achieved, a tourniquet was placed on the patient's upper thigh. The patient's knee and leg were cleaned and prepped with alcohol and DuraPrep and draped in the usual sterile fashion. A  "timeout" was performed as per usual protocol. The lower extremity was exsanguinated using an Esmarch, and the tourniquet was inflated to 300 mmHg. An anterior longitudinal incision was made followed by a standard mid vastus approach. The deep fibers of the medial collateral ligament were elevated in a subperiosteal fashion off of the medial flare of the tibia so as to maintain a continuous soft tissue sleeve. The patella was subluxed laterally and the patellofemoral ligament was incised. Inspection of the knee demonstrated severe degenerative changes with full-thickness loss of articular cartilage. Osteophytes were debrided using a rongeur. Anterior and posterior cruciate ligaments were excised. Two 4.0 mm Schanz pins were inserted in the femur and into the tibia for attachment of the array of trackers used for computer-assisted navigation. Hip center was identified using a circumduction technique. Distal landmarks were mapped using the computer. The distal femur and proximal tibia were mapped using the computer. The distal femoral cutting guide was positioned using computer-assisted navigation so as to achieve a 5 distal valgus cut. The femur was sized and it was felt that a size 6 femoral component was appropriate. A size 6 femoral cutting guide was positioned and the anterior cut was performed and verified using the computer. This was followed by completion of the posterior and chamfer cuts. Femoral cutting guide for the central box was then positioned in the center box cut was performed.  Attention was then directed to the proximal tibia. Medial and lateral menisci were excised. The extramedullary tibial cutting guide was positioned using computer-assisted navigation so as to achieve a 0 varus-valgus alignment and 3 posterior slope. The cut was performed and verified using the computer. The proximal tibia was sized and it was felt that a size 8 tibial tray was appropriate. Tibial and femoral trials were  inserted followed by insertion of a 5 mm polyethylene insert. This allowed for excellent mediolateral soft tissue balancing both in flexion and in full extension. Finally, the patella was cut and prepared so as to accommodate a 41 mm medialized dome patella. A patella trial was placed and the knee was placed through a range of motion with excellent patellar tracking appreciated. The femoral trial was removed after debridement of posterior osteophytes. The central post-hole for the tibial component was reamed followed by insertion of a keel punch. Tibial trials were then removed. Cut surfaces of bone were irrigated with copious amounts of normal saline using pulsatile lavage and then suctioned dry. Polymethylmethacrylate cement with gentamicin was prepared in the usual fashion using a vacuum mixer. Cement was applied to the cut surface of the proximal tibia as well as along the undersurface of a size 8 rotating platform tibial component. Tibial component was positioned and impacted into place. Excess cement was removed using [employer] fast streamer. Cement was then applied to the cut surfaces of the femur as well as along the posterior flanges of the size 6 femoral component. The femoral component was positioned and impacted into place. Excess cement was removed using [employer] fast streamer. A 5 mm polyethylene trial was inserted and the knee was brought into full extension with steady axial compression applied. Finally, cement was applied to the backside of a 41 mm medialized dome patella and the patellar component was positioned and patellar clamp applied. Excess cement was removed using [employer] fast streamer. After adequate curing of the cement, the tourniquet was deflated after a total tourniquet time of 88 minutes. Hemostasis was achieved using electrocautery. The knee was irrigated with copious amounts of normal saline using pulsatile lavage followed by 450 ml of Surgiphor and then suctioned dry. 20 mL of 1.3% Exparel and 60 mL of  0.25% Marcaine in 40 mL of normal saline was injected along the posterior capsule, medial and lateral gutters, and along the arthrotomy site. A 5 mm stabilized rotating platform polyethylene insert was inserted and the knee was placed through a range of motion with excellent mediolateral soft tissue balancing appreciated and excellent patellar tracking noted. 2 medium drains were placed in the wound bed and brought out through separate stab incisions. The medial parapatellar portion of the incision was reapproximated using interrupted sutures of #1 Vicryl. Subcutaneous tissue was approximated in layers using first #0 Vicryl followed #2-0 Vicryl. The skin was approximated with skin staples. A sterile dressing was applied.  The patient tolerated the procedure well and was transported to the recovery room in stable condition.    Ellie Bryand P. Holley Bouche., M.D.

## 2023-01-15 NOTE — Interval H&P Note (Signed)
History and Physical Interval Note:  01/15/2023 11:07 AM  Terry Harvey.  has presented today for surgery, with the diagnosis of PRIMARY OSTEOARTHRITIS OF LEFT KNEE..  The various methods of treatment have been discussed with the patient and family. After consideration of risks, benefits and other options for treatment, the patient has consented to  Procedure(s): COMPUTER ASSISTED TOTAL KNEE ARTHROPLASTY - RNFA (Left) as a surgical intervention.  The patient's history has been reviewed, patient examined, no change in status, stable for surgery.  I have reviewed the patient's chart and labs.  Questions were answered to the patient's satisfaction.     [place]

## 2023-01-15 NOTE — Anesthesia Postprocedure Evaluation (Signed)
Anesthesia Post Note  Patient: Terry Harvey.  Procedure(s) Performed: COMPUTER ASSISTED TOTAL KNEE ARTHROPLASTY (Left: Knee)  Patient location during evaluation: Nursing Unit Anesthesia Type: Spinal Level of consciousness: oriented and awake and alert Pain management: pain level controlled Vital Signs Assessment: post-procedure vital signs reviewed and stable Respiratory status: spontaneous breathing and respiratory function stable Cardiovascular status: blood pressure returned to baseline and stable Postop Assessment: no headache, no backache, no apparent nausea or vomiting and patient able to bend at knees Anesthetic complications: no   No notable events documented.   Last Vitals:  Vitals:   01/15/23 1600 01/15/23 1605  BP: 108/80   Pulse: 87 75  Resp: 18 19  Temp:    SpO2: 98% 98%    Last Pain:  Vitals:   01/15/23 1555  TempSrc:   PainSc: 0-No pain                 Ilene Qua

## 2023-01-15 NOTE — Transfer of Care (Signed)
Immediate Anesthesia Transfer of Care Note  Patient: Terry Harvey.  Procedure(s) Performed: COMPUTER ASSISTED TOTAL KNEE ARTHROPLASTY (Left: Knee)  Patient Location: PACU  Anesthesia Type:MAC and Spinal  Level of Consciousness: awake  Airway & Oxygen Therapy: Patient Spontanous Breathing and Patient connected to face mask oxygen  Post-op Assessment: Report given to RN and Post -op Vital signs reviewed and stable  Post vital signs: Reviewed  Last Vitals:  Vitals Value Taken Time  BP 133/79   Temp 5F   Pulse 88 01/15/23 1541  Resp 14 01/15/23 1541  SpO2 99 % 01/15/23 1541  Vitals shown include unvalidated device data.  Last Pain:  Vitals:   01/15/23 0942  TempSrc: Temporal  PainSc: 5          Complications: No notable events documented.

## 2023-01-15 NOTE — Progress Notes (Signed)
Bladder scan 654m.  Pt is trying to void, has urgency.  Will continue to encourage voiding.

## 2023-01-16 ENCOUNTER — Encounter: Payer: Self-pay | Admitting: Orthopedic Surgery

## 2023-01-16 DIAGNOSIS — Z79899 Other long term (current) drug therapy: Secondary | ICD-10-CM | POA: Diagnosis not present

## 2023-01-16 DIAGNOSIS — Z96659 Presence of unspecified artificial knee joint: Secondary | ICD-10-CM | POA: Diagnosis not present

## 2023-01-16 DIAGNOSIS — Z87891 Personal history of nicotine dependence: Secondary | ICD-10-CM | POA: Diagnosis not present

## 2023-01-16 DIAGNOSIS — M1712 Unilateral primary osteoarthritis, left knee: Secondary | ICD-10-CM | POA: Diagnosis not present

## 2023-01-16 DIAGNOSIS — I10 Essential (primary) hypertension: Secondary | ICD-10-CM | POA: Diagnosis not present

## 2023-01-16 MED ORDER — OXYCODONE HCL 5 MG PO TABS
ORAL_TABLET | ORAL | Status: AC
  Filled 2023-01-16: qty 2

## 2023-01-16 MED ORDER — TRAMADOL HCL 50 MG PO TABS
ORAL_TABLET | ORAL | Status: AC
  Administered 2023-01-16: 50 mg via ORAL
  Filled 2023-01-16: qty 1

## 2023-01-16 MED ORDER — SENNOSIDES-DOCUSATE SODIUM 8.6-50 MG PO TABS
ORAL_TABLET | ORAL | Status: AC
  Administered 2023-01-16: 1 via ORAL
  Filled 2023-01-16: qty 1

## 2023-01-16 MED ORDER — LORATADINE 10 MG PO TABS
ORAL_TABLET | ORAL | Status: AC
  Administered 2023-01-16: 10 mg via ORAL
  Filled 2023-01-16: qty 1

## 2023-01-16 MED ORDER — ENOXAPARIN SODIUM 30 MG/0.3ML IJ SOSY
PREFILLED_SYRINGE | INTRAMUSCULAR | Status: AC
  Administered 2023-01-16: 30 mg via SUBCUTANEOUS
  Filled 2023-01-16: qty 0.3

## 2023-01-16 MED ORDER — ENOXAPARIN SODIUM 40 MG/0.4ML IJ SOSY: 40.0000 mg | PREFILLED_SYRINGE | INTRAMUSCULAR | 0 refills | Status: DC

## 2023-01-16 MED ORDER — OXYCODONE HCL 5 MG PO TABS: 5.0000 mg | ORAL_TABLET | ORAL | 0 refills | Status: DC | PRN

## 2023-01-16 MED ORDER — TRAMADOL HCL 50 MG PO TABS: 50.0000 mg | ORAL_TABLET | ORAL | 0 refills | Status: DC | PRN

## 2023-01-16 MED ORDER — PANTOPRAZOLE SODIUM 40 MG PO TBEC
DELAYED_RELEASE_TABLET | ORAL | Status: AC
  Administered 2023-01-16: 40 mg via ORAL
  Filled 2023-01-16: qty 1

## 2023-01-16 MED ORDER — CELECOXIB 200 MG PO CAPS
ORAL_CAPSULE | ORAL | Status: AC
  Administered 2023-01-16: 200 mg via ORAL
  Filled 2023-01-16: qty 1

## 2023-01-16 MED ORDER — CELECOXIB 200 MG PO CAPS: 200.0000 mg | ORAL_CAPSULE | Freq: Two times a day (BID) | ORAL | 1 refills | Status: DC

## 2023-01-16 MED ORDER — METOCLOPRAMIDE HCL 10 MG PO TABS
ORAL_TABLET | ORAL | Status: AC
  Administered 2023-01-16: 10 mg via ORAL
  Filled 2023-01-16: qty 1

## 2023-01-16 MED ORDER — OXYCODONE HCL 5 MG PO TABS
ORAL_TABLET | ORAL | Status: AC
  Filled 2023-01-16: qty 1

## 2023-01-16 MED ORDER — HYDROCHLOROTHIAZIDE 25 MG PO TABS
ORAL_TABLET | ORAL | Status: AC
  Administered 2023-01-16: 12.5 mg via ORAL
  Filled 2023-01-16: qty 1

## 2023-01-16 MED ORDER — MAGNESIUM HYDROXIDE 400 MG/5ML PO SUSP
ORAL | Status: AC
  Administered 2023-01-16: 30 mL via ORAL
  Filled 2023-01-16: qty 30

## 2023-01-16 MED ORDER — AMLODIPINE BESYLATE 5 MG PO TABS
ORAL_TABLET | ORAL | Status: AC
  Administered 2023-01-16: 5 mg via ORAL
  Filled 2023-01-16: qty 1

## 2023-01-16 MED ORDER — ACETAMINOPHEN 10 MG/ML IV SOLN
INTRAVENOUS | Status: AC
  Filled 2023-01-16: qty 100

## 2023-01-16 NOTE — Evaluation (Signed)
Occupational Therapy Evaluation Patient Details Name: Terry Harvey. MRN: [id number] DOB: May 20, 1951 Today's Date: 01/16/2023   History of Present Illness Pt is a 72 y/o M admitted on 01/15/23 for scheduled L TKA. PMH: HLD, HTN, OA   Clinical Impression    Pt received from PT in hallway without distress. Pt ambulates with supervision and use of RW to bathroom for toileting needs and stand for hand hygiene. Pt then returns to sit on EOB for evaluation. Pt reports being independent at baseline for self care and mobility tasks. Pt will have assistance from family as needed at discharge. Pt educated on polar care and how to increase Ind with LB self care. Pt was able to don clothing items with supervision- set up A with cuing for technique. Paper handout provided as well. Pt with no further OT concerns after education completed. OT to complete order and pt agrees.    Recommendations for follow up therapy are one component of a multi-disciplinary discharge planning process, led by the attending physician.  Recommendations may be updated based on patient status, additional functional criteria and insurance authorization.   Follow Up Recommendations  No OT follow up     Assistance Recommended at Discharge Intermittent Supervision/Assistance  Patient can return home with the following Assistance with cooking/housework;Assist for transportation;Help with stairs or ramp for entrance       Equipment Recommendations  Other (comment) (RW)       Precautions / Restrictions Precautions Precautions: Fall Restrictions Weight Bearing Restrictions: Yes LLE Weight Bearing: Weight bearing as tolerated      Mobility Bed Mobility               General bed mobility comments: Pt seated in recliner chair at end of session and received from PT in hallway    Transfers Overall transfer level: Needs assistance Equipment used: Rolling walker (2 wheels) Transfers: Sit to/from Stand Sit to Stand:  Supervision                  Balance Overall balance assessment: Needs assistance Sitting-balance support: Feet supported, No upper extremity supported Sitting balance-Leahy Scale: Good     Standing balance support: During functional activity, No upper extremity supported Standing balance-Leahy Scale: Fair                             ADL either performed or assessed with clinical judgement   ADL Overall ADL's : Needs assistance/impaired                                       General ADL Comments: supervision overall for UB and LB dressing and toileting tasks.     Vision Patient Visual Report: No change from baseline              Pertinent Vitals/Pain Pain Assessment Pain Assessment: 0-10 Pain Score: 3  Pain Location: L knee Pain Descriptors / Indicators: Discomfort Pain Intervention(s): Monitored during session, Repositioned     Hand Dominance Right   Extremity/Trunk Assessment Upper Extremity Assessment Upper Extremity Assessment: Overall WFL for tasks assessed   Lower Extremity Assessment Lower Extremity Assessment: Defer to PT evaluation LLE Deficits / Details: 3/5 knee extension in sitting       Communication Communication Communication: No difficulties   Cognition Arousal/Alertness: Awake/alert Behavior During Therapy: WFL for tasks assessed/performed Overall Cognitive Status:  Within Functional Limits for tasks assessed                                                  Home Living Family/patient expects to be discharged to:: Private residence Living Arrangements: Spouse/significant other Available Help at Discharge: Family Type of Home: House Home Access: Stairs to enter Technical brewer of Steps: 1 Entrance Stairs-Rails: None Home Layout: One level               Home Equipment: Radio producer - single point;Crutches;BSC/3in1          Prior Functioning/Environment Prior  Level of Function : Independent/Modified Independent;Driving                                 OT Goals(Current goals can be found in the care plan section) Acute Rehab OT Goals Patient Stated Goal: to go home OT Goal Formulation: With patient/family Time For Goal Achievement: 01/16/23 Potential to Achieve Goals: Good  OT Frequency:         AM-PAC OT "6 Clicks" Daily Activity     Outcome Measure Help from another person eating meals?: None Help from another person taking care of personal grooming?: None Help from another person toileting, which includes using toliet, bedpan, or urinal?: None Help from another person bathing (including washing, rinsing, drying)?: None Help from another person to put on and taking off regular upper body clothing?: None Help from another person to put on and taking off regular lower body clothing?: None 6 Click Score: 24   End of Session Equipment Utilized During Treatment: Rolling walker (2 wheels) Nurse Communication: Mobility status  Activity Tolerance: Patient tolerated treatment well Patient left: in bed                   Time: 0945-1000 OT Time Calculation (min): 15 min Charges:  OT General Charges $OT Visit: 1 Visit OT Evaluation $OT Eval Low Complexity: 1 Low OT Treatments $Self Care/Home Management : 8-22 mins  Darleen Crocker, MS, OTR/L , CBIS ascom [phone]  01/16/23, 1:38 PM

## 2023-01-16 NOTE — Discharge Summary (Signed)
Physician Discharge Summary  Subjective: 1 Day Post-Op Procedure(s) (LRB): COMPUTER ASSISTED TOTAL KNEE ARTHROPLASTY (Left) Patient reports pain as mild.   Patient seen in rounds with Dr. Marry Guan. Patient is well, and has had no acute complaints or problems Patient is ready to go home after physical therapy  Physician Discharge Summary  Patient ID: Terry Harvey. MRN: [id number] DOB/AGE: 1950/12/21 72 y.o.  Admit date: 01/15/2023 Discharge date: 01/16/2023  Admission Diagnoses:  Discharge Diagnoses:  Principal Problem:   Total knee replacement status   Discharged Condition: good  Hospital Course: The patient is postop day 1 from a left total knee arthroplasty.  He has done well with pain control.  His vitals have remained stable.  His drain was removed this morning.  The patient is presently physical therapy this morning and will be discharged home after completing physical therapy goals.  Treatments: surgery:  After discontinuing Lovenox, the patient can begin aspirin 81 mg twice a day.  Discharge Exam: Blood pressure (!) 148/91, pulse 98, temperature 98.2 F (36.8 C), resp. rate 16, height '5\' 11"'$  (1.803 m), weight 83.9 kg, SpO2 94 %.   Disposition: Discharge disposition: 01-Home or Self Care        Allergies as of 01/16/2023       Reactions   Poison Oak Extract Rash        Medication List     STOP taking these medications    naproxen sodium 220 MG tablet Commonly known as: ALEVE       TAKE these medications    acetaminophen 500 MG tablet Commonly known as: TYLENOL Take 500 mg by mouth every 6 (six) hours as needed.   amLODipine 5 MG tablet Commonly known as: NORVASC Take 1 tablet (5 mg total) by mouth daily.   bisoprolol 5 MG tablet Commonly known as: ZEBETA Take 1 tablet (5 mg total) by mouth daily.   calcium carbonate 750 MG chewable tablet Commonly known as: TUMS EX Chew 1 tablet by mouth daily.   celecoxib 200 MG capsule Commonly  known as: CELEBREX Take 1 capsule (200 mg total) by mouth 2 (two) times daily.   D3 5000 PO Take by mouth.   enoxaparin 40 MG/0.4ML injection Commonly known as: LOVENOX Inject 0.4 mLs (40 mg total) into the skin daily for 14 days.   gabapentin 300 MG capsule Commonly known as: NEURONTIN Take one tab po qhs for spasm   hydrochlorothiazide 12.5 MG tablet Commonly known as: HYDRODIURIL Take 1 tablet (12.5 mg total) by mouth daily.   loratadine 10 MG dissolvable tablet Commonly known as: CLARITIN REDITABS Take 10 mg by mouth daily.   omeprazole 20 MG tablet Commonly known as: PRILOSEC OTC Take 20 mg by mouth daily.   ONE-A-DAY MENS PO Take 1 tablet by mouth daily.   OVER THE COUNTER MEDICATION Golden Revive   OVER THE COUNTER MEDICATION CBD gummies   OVER THE COUNTER MEDICATION Delta 8/9   oxyCODONE 5 MG immediate release tablet Commonly known as: Oxy IR/ROXICODONE Take 1 tablet (5 mg total) by mouth every 4 (four) hours as needed for moderate pain (pain score 4-6).   rosuvastatin 5 MG tablet Commonly known as: CRESTOR Take 1 tablet (5 mg total) by mouth daily.   traMADol 50 MG tablet Commonly known as: ULTRAM Take one tab po tid for knee OA What changed: Another medication with the same name was added. Make sure you understand how and when to take each.   traMADol 50 MG tablet  Commonly known as: ULTRAM Take 1-2 tablets (50-100 mg total) by mouth every 4 (four) hours as needed for moderate pain. What changed: You were already taking a medication with the same name, and this prescription was added. Make sure you understand how and when to take each.               Durable Medical Equipment  (From admission, onward)           Start     Ordered   01/15/23 1711  DME Walker rolling  Once       Question:  Patient needs a walker to treat with the following condition  Answer:  Total knee replacement status   01/15/23 1710   01/15/23 1711  DME Bedside  commode  Once       Comments: Patient is not able to walk the distance required to go the bathroom, or he/she is unable to safely negotiate stairs required to access the bathroom.  A 3in1 BSC will alleviate this problem  Question:  Patient needs a bedside commode to treat with the following condition  Answer:  Total knee replacement status   01/15/23 1710            Follow-up Information     Watt Climes, PA Follow up on 01/29/2023.   Specialty: Physician Assistant Why: at 9:45am Contact information: [place] [address] [phone]         Dereck Leep, MD Follow up on 02/27/2023.   Specialty: Orthopedic Surgery Why: at 2 ;45pm Contact information: [address] [facility] [place] [address] [phone]                 Signed: Prescott Parma, Geana Walts 01/16/2023, 7:13 AM   Objective: Vital signs in last 24 hours: Temp:  [96.8 F (36 C)-98.7 F (37.1 C)] 98.2 F (36.8 C) (02/29 0430) Pulse Rate:  [72-98] 98 (02/29 0430) Resp:  [14-21] 16 (02/29 0430) BP: (108-154)/(68-96) 148/91 (02/29 0430) SpO2:  [85 %-100 %] 94 % (02/29 0430) Weight:  [83.9 kg] 83.9 kg (02/28 0942)  Intake/Output from previous day:  Intake/Output Summary (Last 24 hours) at 01/16/2023 0713 Last data filed at 01/16/2023 0420 Gross per 24 hour  Intake 3766.67 ml  Output 1645 ml  Net 2121.67 ml    Intake/Output this shift: No intake/output data recorded.  Labs: No results for input(s): "HGB" in the last 72 hours. No results for input(s): "WBC", "RBC", "HCT", "PLT" in the last 72 hours. No results for input(s): "NA", "K", "CL", "CO2", "BUN", "CREATININE", "GLUCOSE", "CALCIUM" in the last 72 hours. No results for input(s): "LABPT", "INR" in the last 72 hours.  EXAM: General - Patient is Alert and Oriented Extremity - Neurovascular intact Sensation intact distally Dorsiflexion/Plantar flexion intact Compartment soft Incision - clean, dry, with  the Hemovac tubing removed with no complication. Motor Function -plantarflexion and dorsiflexion intact.  Able to straight leg raise independently.  Assessment/Plan: 1 Day Post-Op Procedure(s) (LRB): COMPUTER ASSISTED TOTAL KNEE ARTHROPLASTY (Left) Procedure(s) (LRB): COMPUTER ASSISTED TOTAL KNEE ARTHROPLASTY (Left) Past Medical History:  Diagnosis Date   Hyperlipidemia    Hypertension    Insomnia    Osteoarthritis    Principal Problem:   Total knee replacement status  Estimated body mass index is 25.8 kg/m as calculated from the following:   Height as of this encounter: '5\' 11"'$  (1.803 m).   Weight as of this encounter: 83.9 kg. Advance diet Up with therapy  D/C IV fluids Discharge home with home health Diet - Regular diet Follow up - in 2 weeks Activity - WBAT Disposition - Home Condition Upon Discharge - Stable DVT Prophylaxis - Lovenox and TED hose  Reche Dixon, PA-C Orthopaedic Surgery 01/16/2023, 7:13 AM

## 2023-01-16 NOTE — Evaluation (Signed)
Physical Therapy Evaluation Patient Details Name: Terry Harvey. MRN: [id number] DOB: 1951-11-14 Today's Date: 01/16/2023  History of Present Illness  Pt is a 72 y/o M admitted on 01/15/23 for scheduled L TKA. PMH: HLD, HTN, OA  Clinical Impression  Pt seen for PT evaluation with pt agreeable, fiance present for session. Pt reports prior to admission he was independent without AD, living in a 1 level home with 1 step to enter. PT provided pt with HEP handout & pt performs exercises with PRN cuing for technique. Pt is able to complete bed mobility with supervision, STS & gait with RW & CGA, and negotiate 4 steps with B rails with CGA. PT educates pt & fiance on single step negotiation with RW & they voice understanding. Pt & fiance both voice comfort with d/c home today; team made aware.   Recommendations for follow up therapy are one component of a multi-disciplinary discharge planning process, led by the attending physician.  Recommendations may be updated based on patient status, additional functional criteria and insurance authorization.  Follow Up Recommendations Follow physician's recommendations for discharge plan and follow up therapies (pt reports he is already set up with HHPT)      Assistance Recommended at Discharge Set up Supervision/Assistance  Patient can return home with the following  Assistance with cooking/housework;Assist for transportation;Help with stairs or ramp for entrance    Equipment Recommendations Rolling walker (2 wheels)  Recommendations for Other Services       Functional Status Assessment Patient has had a recent decline in their functional status and demonstrates the ability to make significant improvements in function in a reasonable and predictable amount of time.     Precautions / Restrictions Precautions Precautions: Fall Restrictions Weight Bearing Restrictions: Yes LLE Weight Bearing: Weight bearing as tolerated      Mobility  Bed  Mobility Overal bed mobility: Modified Independent Bed Mobility: Supine to Sit     Supine to sit: Modified independent (Device/Increase time), HOB elevated (use of bed rails PRN)          Transfers Overall transfer level: Needs assistance Equipment used: Rolling walker (2 wheels) Transfers: Sit to/from Stand Sit to Stand: Min guard           General transfer comment: cuing re: safe hand placement during STS from EOB with RW    Ambulation/Gait Ambulation/Gait assistance: Min guard Gait Distance (Feet): 200 Feet Assistive device: Rolling walker (2 wheels) Gait Pattern/deviations: Decreased step length - right, Decreased step length - left, Decreased dorsiflexion - left, Decreased stride length Gait velocity: slightly decreased     General Gait Details: Education re: side stepping with use of RW, decreased L knee/hip flexion during swing phase. Pt is able to take ~2 steps x 2 without AD with min assist without LLE knee buckling.  Stairs Stairs: Yes Stairs assistance: Min guard Stair Management: Two rails, Step to pattern Number of Stairs: 4 General stair comments: PT provides demonstration & education re: stair negotiation with B rails with pt return demonstrating with CGA. Also verbally educated & provided demonstration re: single step negotiation with RW. Pt & fiance voice understanding.  Wheelchair Mobility    Modified Rankin (Stroke Patients Only)       Balance Overall balance assessment: Needs assistance Sitting-balance support: Feet supported, No upper extremity supported Sitting balance-Leahy Scale: Good     Standing balance support: During functional activity, No upper extremity supported Standing balance-Leahy Scale: Fair  Pertinent Vitals/Pain Pain Assessment Pain Assessment: 0-10 Pain Score: 4  Pain Location: L knee with exercises (flexion AROM) Pain Descriptors / Indicators: Discomfort Pain  Intervention(s): Monitored during session    Home Living Family/patient expects to be discharged to:: Private residence Living Arrangements: Spouse/significant other (fiance) Available Help at Discharge: Family Type of Home: House Home Access: Stairs to enter Entrance Stairs-Rails: None Technical brewer of Steps: 1   Home Layout: One level Home Equipment: Radio producer - single point;Crutches;BSC/3in1      Prior Function Prior Level of Function : Independent/Modified Independent;Driving                     Hand Dominance        Extremity/Trunk Assessment   Upper Extremity Assessment Upper Extremity Assessment: Overall WFL for tasks assessed    Lower Extremity Assessment Lower Extremity Assessment: LLE deficits/detail LLE Deficits / Details: 3/5 knee extension in sitting       Communication   Communication: No difficulties  Cognition Arousal/Alertness: Awake/alert Behavior During Therapy: WFL for tasks assessed/performed Overall Cognitive Status: Within Functional Limits for tasks assessed                                          General Comments      Exercises Total Joint Exercises Ankle Circles/Pumps: AROM, Left, 10 reps, Supine Quad Sets: AROM, Strengthening, Left, 10 reps, Supine Short Arc Quad: AROM, Strengthening, Left, 10 reps, Supine Heel Slides: AROM, Supine, Strengthening, Left, 10 reps Hip ABduction/ADduction: AROM, Supine, Strengthening, Left, 10 reps (hip abduction slides) Straight Leg Raises: AROM, Supine, Strengthening, Left, 10 reps Long Arc Quad: Strengthening, AROM, Supine, Left, 10 reps Knee Flexion: AROM, Supine, Left, 10 reps Goniometric ROM: ~3 degrees from full extension, ~105 degrees flexion   Assessment/Plan    PT Assessment Patient needs continued PT services  PT Problem List Decreased strength;Pain;Decreased range of motion;Decreased activity tolerance;Decreased balance;Decreased  mobility;Decreased safety awareness;Decreased knowledge of use of DME       PT Treatment Interventions Therapeutic exercise;DME instruction;Balance training;Stair training;Neuromuscular re-education;Gait training;Functional mobility training;Therapeutic activities;Patient/family education;Modalities;Manual techniques    PT Goals (Current goals can be found in the Care Plan section)  Acute Rehab PT Goals Patient Stated Goal: go home PT Goal Formulation: With patient Time For Goal Achievement: 01/30/23 Potential to Achieve Goals: Good    Frequency BID     Co-evaluation               AM-PAC PT "6 Clicks" Mobility  Outcome Measure Help needed turning from your back to your side while in a flat bed without using bedrails?: None Help needed moving from lying on your back to sitting on the side of a flat bed without using bedrails?: None Help needed moving to and from a bed to a chair (including a wheelchair)?: A Little Help needed standing up from a chair using your arms (e.g., wheelchair or bedside chair)?: A Little Help needed to walk in hospital room?: A Little Help needed climbing 3-5 steps with a railing? : A Little 6 Click Score: 20    End of Session   Activity Tolerance: Patient tolerated treatment well Patient left:  (in handoff to OT)   PT Visit Diagnosis: Pain;Muscle weakness (generalized) (M62.81);Other abnormalities of gait and mobility (R26.89);Unsteadiness on feet (R26.81);Difficulty in walking, not elsewhere classified (R26.2) Pain - Right/Left: Left Pain - part of body:  Knee    Time: 0920-0946 PT Time Calculation (min) (ACUTE ONLY): 26 min   Charges:   PT Evaluation $PT Eval Low Complexity: 1 Low PT Treatments $Therapeutic Exercise: 8-22 mins        Lavone Nian, PT, DPT 01/16/23, 11:56 AM   Waunita Schooner 01/16/2023, 11:50 AM

## 2023-01-16 NOTE — Progress Notes (Signed)
  Subjective: 1 Day Post-Op Procedure(s) (LRB): COMPUTER ASSISTED TOTAL KNEE ARTHROPLASTY (Left) Patient reports pain as mild.   Patient is well, and has had no acute complaints or problems Plan is to go Home after hospital stay. Negative for chest pain and shortness of breath Fever: no Gastrointestinal: Negative for nausea and vomiting  Objective: Vital signs in last 24 hours: Temp:  [96.8 F (36 C)-98.7 F (37.1 C)] 98.2 F (36.8 C) (02/29 0430) Pulse Rate:  [72-98] 98 (02/29 0430) Resp:  [14-21] 16 (02/29 0430) BP: (108-154)/(68-96) 148/91 (02/29 0430) SpO2:  [85 %-100 %] 94 % (02/29 0430) Weight:  [83.9 kg] 83.9 kg (02/28 0942)  Intake/Output from previous day:  Intake/Output Summary (Last 24 hours) at 01/16/2023 0708 Last data filed at 01/16/2023 0420 Gross per 24 hour  Intake 3766.67 ml  Output 1645 ml  Net 2121.67 ml    Intake/Output this shift: No intake/output data recorded.  Labs: No results for input(s): "HGB" in the last 72 hours. No results for input(s): "WBC", "RBC", "HCT", "PLT" in the last 72 hours. No results for input(s): "NA", "K", "CL", "CO2", "BUN", "CREATININE", "GLUCOSE", "CALCIUM" in the last 72 hours. No results for input(s): "LABPT", "INR" in the last 72 hours.   EXAM General - Patient is Alert and Oriented Extremity - Neurovascular intact Sensation intact distally Dorsiflexion/Plantar flexion intact Compartment soft Dressing/Incision - clean, dry, with a Hemovac tubing removed with no complication.  The Hemovac tubing was intact on removal. Motor Function - intact, moving foot and toes well on exam.  Able to straight leg raise independently.  Past Medical History:  Diagnosis Date   Hyperlipidemia    Hypertension    Insomnia    Osteoarthritis     Assessment/Plan: 1 Day Post-Op Procedure(s) (LRB): COMPUTER ASSISTED TOTAL KNEE ARTHROPLASTY (Left) Principal Problem:   Total knee replacement status  Estimated body mass index is 25.8  kg/m as calculated from the following:   Height as of this encounter: 5' 11"$  (1.803 m).   Weight as of this encounter: 83.9 kg. Advance diet Up with therapy D/C IV fluids Discharge home with home health  DVT Prophylaxis - Lovenox, Foot Pumps, and TED hose Weight-Bearing as tolerated to left leg  Reche Dixon, PA-C Orthopaedic Surgery 01/16/2023, 7:08 AM

## 2023-01-16 NOTE — TOC Progression Note (Addendum)
Transition of Care (TOC) - Progression Note    Patient Details  Name: Terry Harvey. MRN: [id number] Date of Birth: Jul 23, 1951  Transition of Care [facility]) CM/SW Rafael Capo, RN Phone Number: 01/16/2023, 11:29 AM  Clinical Narrative:     The patient is set up for [facility] for [facility] to deliver a RW to the bedside   Expected Discharge Plan: [place] Barriers to Discharge: No Barriers Identified  Expected Discharge Plan and Services   Discharge Planning Services: CM Consult   Living arrangements for the past 2 months: Single Family Home Expected Discharge Date: 01/16/23               DME Arranged: Gilford Rile rolling, 3-N-1 DME Agency: [employer] Date DME Agency Contacted: 01/16/23 Time DME Agency Contacted: [id number] Representative spoke with at DME Agency: intake HH Arranged: PT [place]: [place] Date [place]: 01/16/23 Time [place]: [id number] Representative spoke with at [place]: [place]   Social Determinants of Health ([place]) Interventions SDOH Screenings   Food Insecurity: No Food Insecurity (01/15/2023)  Housing: Low Risk  (01/15/2023)  Transportation Needs: No Transportation Needs (01/15/2023)  Utilities: Not At Risk (01/15/2023)  Alcohol Screen: Low Risk  (12/28/2021)  Depression (PHQ2-9): Low Risk  (12/30/2022)  Financial Resource Strain: Low Risk  (09/28/2021)  Tobacco Use: Medium Risk (01/16/2023)    Readmission Risk Interventions     No data to display

## 2023-01-16 NOTE — Progress Notes (Signed)
Patient is not able to walk the distance required to go the bathroom, or he/she is unable to safely negotiate stairs required to access the bathroom.  A 3in1 BSC will alleviate this problem  

## 2023-01-17 DIAGNOSIS — I10 Essential (primary) hypertension: Secondary | ICD-10-CM | POA: Diagnosis not present

## 2023-01-17 DIAGNOSIS — M1990 Unspecified osteoarthritis, unspecified site: Secondary | ICD-10-CM | POA: Diagnosis not present

## 2023-01-17 DIAGNOSIS — E785 Hyperlipidemia, unspecified: Secondary | ICD-10-CM | POA: Diagnosis not present

## 2023-01-17 DIAGNOSIS — Z7901 Long term (current) use of anticoagulants: Secondary | ICD-10-CM | POA: Diagnosis not present

## 2023-01-17 DIAGNOSIS — Z96652 Presence of left artificial knee joint: Secondary | ICD-10-CM | POA: Diagnosis not present

## 2023-01-17 DIAGNOSIS — Z87891 Personal history of nicotine dependence: Secondary | ICD-10-CM | POA: Diagnosis not present

## 2023-01-17 DIAGNOSIS — G4700 Insomnia, unspecified: Secondary | ICD-10-CM | POA: Diagnosis not present

## 2023-01-17 DIAGNOSIS — Z791 Long term (current) use of non-steroidal anti-inflammatories (NSAID): Secondary | ICD-10-CM | POA: Diagnosis not present

## 2023-01-17 DIAGNOSIS — Z471 Aftercare following joint replacement surgery: Secondary | ICD-10-CM | POA: Diagnosis not present

## 2023-01-17 DIAGNOSIS — Z9181 History of falling: Secondary | ICD-10-CM | POA: Diagnosis not present

## 2023-01-19 DIAGNOSIS — Z791 Long term (current) use of non-steroidal anti-inflammatories (NSAID): Secondary | ICD-10-CM | POA: Diagnosis not present

## 2023-01-19 DIAGNOSIS — M1990 Unspecified osteoarthritis, unspecified site: Secondary | ICD-10-CM | POA: Diagnosis not present

## 2023-01-19 DIAGNOSIS — G4700 Insomnia, unspecified: Secondary | ICD-10-CM | POA: Diagnosis not present

## 2023-01-19 DIAGNOSIS — Z7901 Long term (current) use of anticoagulants: Secondary | ICD-10-CM | POA: Diagnosis not present

## 2023-01-19 DIAGNOSIS — Z9181 History of falling: Secondary | ICD-10-CM | POA: Diagnosis not present

## 2023-01-19 DIAGNOSIS — Z96652 Presence of left artificial knee joint: Secondary | ICD-10-CM | POA: Diagnosis not present

## 2023-01-19 DIAGNOSIS — Z87891 Personal history of nicotine dependence: Secondary | ICD-10-CM | POA: Diagnosis not present

## 2023-01-19 DIAGNOSIS — Z471 Aftercare following joint replacement surgery: Secondary | ICD-10-CM | POA: Diagnosis not present

## 2023-01-19 DIAGNOSIS — E785 Hyperlipidemia, unspecified: Secondary | ICD-10-CM | POA: Diagnosis not present

## 2023-01-19 DIAGNOSIS — I10 Essential (primary) hypertension: Secondary | ICD-10-CM | POA: Diagnosis not present

## 2023-01-20 DIAGNOSIS — G4700 Insomnia, unspecified: Secondary | ICD-10-CM | POA: Diagnosis not present

## 2023-01-20 DIAGNOSIS — I10 Essential (primary) hypertension: Secondary | ICD-10-CM | POA: Diagnosis not present

## 2023-01-20 DIAGNOSIS — Z9181 History of falling: Secondary | ICD-10-CM | POA: Diagnosis not present

## 2023-01-20 DIAGNOSIS — M1990 Unspecified osteoarthritis, unspecified site: Secondary | ICD-10-CM | POA: Diagnosis not present

## 2023-01-20 DIAGNOSIS — Z471 Aftercare following joint replacement surgery: Secondary | ICD-10-CM | POA: Diagnosis not present

## 2023-01-20 DIAGNOSIS — Z87891 Personal history of nicotine dependence: Secondary | ICD-10-CM | POA: Diagnosis not present

## 2023-01-20 DIAGNOSIS — Z7901 Long term (current) use of anticoagulants: Secondary | ICD-10-CM | POA: Diagnosis not present

## 2023-01-20 DIAGNOSIS — Z791 Long term (current) use of non-steroidal anti-inflammatories (NSAID): Secondary | ICD-10-CM | POA: Diagnosis not present

## 2023-01-20 DIAGNOSIS — Z96652 Presence of left artificial knee joint: Secondary | ICD-10-CM | POA: Diagnosis not present

## 2023-01-20 DIAGNOSIS — E785 Hyperlipidemia, unspecified: Secondary | ICD-10-CM | POA: Diagnosis not present

## 2023-01-22 DIAGNOSIS — E785 Hyperlipidemia, unspecified: Secondary | ICD-10-CM | POA: Diagnosis not present

## 2023-01-22 DIAGNOSIS — Z87891 Personal history of nicotine dependence: Secondary | ICD-10-CM | POA: Diagnosis not present

## 2023-01-22 DIAGNOSIS — Z791 Long term (current) use of non-steroidal anti-inflammatories (NSAID): Secondary | ICD-10-CM | POA: Diagnosis not present

## 2023-01-22 DIAGNOSIS — Z7901 Long term (current) use of anticoagulants: Secondary | ICD-10-CM | POA: Diagnosis not present

## 2023-01-22 DIAGNOSIS — G4700 Insomnia, unspecified: Secondary | ICD-10-CM | POA: Diagnosis not present

## 2023-01-22 DIAGNOSIS — M1990 Unspecified osteoarthritis, unspecified site: Secondary | ICD-10-CM | POA: Diagnosis not present

## 2023-01-22 DIAGNOSIS — Z9181 History of falling: Secondary | ICD-10-CM | POA: Diagnosis not present

## 2023-01-22 DIAGNOSIS — I10 Essential (primary) hypertension: Secondary | ICD-10-CM | POA: Diagnosis not present

## 2023-01-22 DIAGNOSIS — Z471 Aftercare following joint replacement surgery: Secondary | ICD-10-CM | POA: Diagnosis not present

## 2023-01-22 DIAGNOSIS — Z96652 Presence of left artificial knee joint: Secondary | ICD-10-CM | POA: Diagnosis not present

## 2023-01-24 DIAGNOSIS — Z96652 Presence of left artificial knee joint: Secondary | ICD-10-CM | POA: Diagnosis not present

## 2023-01-24 DIAGNOSIS — I10 Essential (primary) hypertension: Secondary | ICD-10-CM | POA: Diagnosis not present

## 2023-01-24 DIAGNOSIS — M1990 Unspecified osteoarthritis, unspecified site: Secondary | ICD-10-CM | POA: Diagnosis not present

## 2023-01-24 DIAGNOSIS — Z87891 Personal history of nicotine dependence: Secondary | ICD-10-CM | POA: Diagnosis not present

## 2023-01-24 DIAGNOSIS — Z791 Long term (current) use of non-steroidal anti-inflammatories (NSAID): Secondary | ICD-10-CM | POA: Diagnosis not present

## 2023-01-24 DIAGNOSIS — Z9181 History of falling: Secondary | ICD-10-CM | POA: Diagnosis not present

## 2023-01-24 DIAGNOSIS — E785 Hyperlipidemia, unspecified: Secondary | ICD-10-CM | POA: Diagnosis not present

## 2023-01-24 DIAGNOSIS — Z471 Aftercare following joint replacement surgery: Secondary | ICD-10-CM | POA: Diagnosis not present

## 2023-01-24 DIAGNOSIS — Z7901 Long term (current) use of anticoagulants: Secondary | ICD-10-CM | POA: Diagnosis not present

## 2023-01-24 DIAGNOSIS — G4700 Insomnia, unspecified: Secondary | ICD-10-CM | POA: Diagnosis not present

## 2023-01-27 DIAGNOSIS — Z791 Long term (current) use of non-steroidal anti-inflammatories (NSAID): Secondary | ICD-10-CM | POA: Diagnosis not present

## 2023-01-27 DIAGNOSIS — Z87891 Personal history of nicotine dependence: Secondary | ICD-10-CM | POA: Diagnosis not present

## 2023-01-27 DIAGNOSIS — Z96652 Presence of left artificial knee joint: Secondary | ICD-10-CM | POA: Diagnosis not present

## 2023-01-27 DIAGNOSIS — G4700 Insomnia, unspecified: Secondary | ICD-10-CM | POA: Diagnosis not present

## 2023-01-27 DIAGNOSIS — M1990 Unspecified osteoarthritis, unspecified site: Secondary | ICD-10-CM | POA: Diagnosis not present

## 2023-01-27 DIAGNOSIS — I10 Essential (primary) hypertension: Secondary | ICD-10-CM | POA: Diagnosis not present

## 2023-01-27 DIAGNOSIS — Z9181 History of falling: Secondary | ICD-10-CM | POA: Diagnosis not present

## 2023-01-27 DIAGNOSIS — Z7901 Long term (current) use of anticoagulants: Secondary | ICD-10-CM | POA: Diagnosis not present

## 2023-01-27 DIAGNOSIS — E785 Hyperlipidemia, unspecified: Secondary | ICD-10-CM | POA: Diagnosis not present

## 2023-01-27 DIAGNOSIS — Z471 Aftercare following joint replacement surgery: Secondary | ICD-10-CM | POA: Diagnosis not present

## 2023-01-28 DIAGNOSIS — I10 Essential (primary) hypertension: Secondary | ICD-10-CM | POA: Diagnosis not present

## 2023-01-28 DIAGNOSIS — M1990 Unspecified osteoarthritis, unspecified site: Secondary | ICD-10-CM | POA: Diagnosis not present

## 2023-01-28 DIAGNOSIS — Z9181 History of falling: Secondary | ICD-10-CM | POA: Diagnosis not present

## 2023-01-28 DIAGNOSIS — Z791 Long term (current) use of non-steroidal anti-inflammatories (NSAID): Secondary | ICD-10-CM | POA: Diagnosis not present

## 2023-01-28 DIAGNOSIS — G4700 Insomnia, unspecified: Secondary | ICD-10-CM | POA: Diagnosis not present

## 2023-01-28 DIAGNOSIS — E785 Hyperlipidemia, unspecified: Secondary | ICD-10-CM | POA: Diagnosis not present

## 2023-01-28 DIAGNOSIS — Z96652 Presence of left artificial knee joint: Secondary | ICD-10-CM | POA: Diagnosis not present

## 2023-01-28 DIAGNOSIS — Z471 Aftercare following joint replacement surgery: Secondary | ICD-10-CM | POA: Diagnosis not present

## 2023-01-28 DIAGNOSIS — Z87891 Personal history of nicotine dependence: Secondary | ICD-10-CM | POA: Diagnosis not present

## 2023-01-28 DIAGNOSIS — Z7901 Long term (current) use of anticoagulants: Secondary | ICD-10-CM | POA: Diagnosis not present

## 2023-01-29 DIAGNOSIS — M25561 Pain in right knee: Secondary | ICD-10-CM | POA: Diagnosis not present

## 2023-01-29 DIAGNOSIS — M25461 Effusion, right knee: Secondary | ICD-10-CM | POA: Diagnosis not present

## 2023-01-31 ENCOUNTER — Encounter: Payer: Self-pay | Admitting: Nurse Practitioner

## 2023-01-31 DIAGNOSIS — M25461 Effusion, right knee: Secondary | ICD-10-CM | POA: Diagnosis not present

## 2023-01-31 DIAGNOSIS — M25561 Pain in right knee: Secondary | ICD-10-CM | POA: Diagnosis not present

## 2023-01-31 MED ORDER — ZOLPIDEM TARTRATE 10 MG PO TABS: 10.0000 mg | ORAL_TABLET | Freq: Every evening | ORAL | 1 refills | Status: DC | PRN

## 2023-02-03 ENCOUNTER — Encounter: Payer: Self-pay | Admitting: Orthopedic Surgery

## 2023-02-04 DIAGNOSIS — M25561 Pain in right knee: Secondary | ICD-10-CM | POA: Diagnosis not present

## 2023-02-04 DIAGNOSIS — M25461 Effusion, right knee: Secondary | ICD-10-CM | POA: Diagnosis not present

## 2023-02-07 DIAGNOSIS — M25461 Effusion, right knee: Secondary | ICD-10-CM | POA: Diagnosis not present

## 2023-02-07 DIAGNOSIS — M25561 Pain in right knee: Secondary | ICD-10-CM | POA: Diagnosis not present

## 2023-02-12 DIAGNOSIS — M25461 Effusion, right knee: Secondary | ICD-10-CM | POA: Diagnosis not present

## 2023-02-12 DIAGNOSIS — M25561 Pain in right knee: Secondary | ICD-10-CM | POA: Diagnosis not present

## 2023-02-19 DIAGNOSIS — M25561 Pain in right knee: Secondary | ICD-10-CM | POA: Diagnosis not present

## 2023-02-19 DIAGNOSIS — M25461 Effusion, right knee: Secondary | ICD-10-CM | POA: Diagnosis not present

## 2023-02-27 DIAGNOSIS — Z96652 Presence of left artificial knee joint: Secondary | ICD-10-CM | POA: Diagnosis not present

## 2023-03-15 DIAGNOSIS — Z471 Aftercare following joint replacement surgery: Secondary | ICD-10-CM | POA: Diagnosis not present

## 2023-03-20 ENCOUNTER — Ambulatory Visit (INDEPENDENT_AMBULATORY_CARE_PROVIDER_SITE_OTHER): Payer: Medicare HMO | Admitting: Nurse Practitioner

## 2023-03-20 ENCOUNTER — Encounter: Payer: Self-pay | Admitting: Nurse Practitioner

## 2023-03-20 VITALS — BP 130/80 | HR 73 | Temp 98.2°F | Resp 16 | Ht 71.0 in | Wt 190.6 lb

## 2023-03-20 DIAGNOSIS — I10 Essential (primary) hypertension: Secondary | ICD-10-CM

## 2023-03-20 DIAGNOSIS — M79604 Pain in right leg: Secondary | ICD-10-CM | POA: Diagnosis not present

## 2023-03-20 DIAGNOSIS — F5101 Primary insomnia: Secondary | ICD-10-CM

## 2023-03-20 DIAGNOSIS — G8929 Other chronic pain: Secondary | ICD-10-CM | POA: Diagnosis not present

## 2023-03-20 DIAGNOSIS — M79605 Pain in left leg: Secondary | ICD-10-CM | POA: Diagnosis not present

## 2023-03-20 DIAGNOSIS — M25562 Pain in left knee: Secondary | ICD-10-CM | POA: Diagnosis not present

## 2023-03-20 MED ORDER — GABAPENTIN 300 MG PO CAPS: 600.0000 mg | ORAL_CAPSULE | Freq: Every day | ORAL | 3 refills | Status: DC

## 2023-03-20 MED ORDER — TRAMADOL HCL 50 MG PO TABS: 50.0000 mg | ORAL_TABLET | Freq: Three times a day (TID) | ORAL | 2 refills | Status: DC | PRN

## 2023-03-20 MED ORDER — ZOLPIDEM TARTRATE 10 MG PO TABS: 10.0000 mg | ORAL_TABLET | Freq: Every evening | ORAL | 2 refills | Status: DC | PRN

## 2023-03-20 NOTE — Progress Notes (Signed)
[facility] [address] [place], [place] [address]  Internal MEDICINE  Office Visit Note  Patient Name: Terry Harvey  [id number]  [id number]  Date of Service: 03/20/2023  Chief Complaint  Patient presents with   Hypertension   Hyperlipidemia   Follow-up    Med refills    HPI Terry Harvey presents for a follow-up visit for left knee pain, hypertension, insomnia, and neuropathy.  Had left knee replacement done. Was switched to celebrex which is helping that patient. Still takes tramadol 3 times daily as needed but does not have as much breakthrough pain.  Neuropathy present -- specialist increased the gabapentin dose.  Insomnia -- takes [place].     Current Medication: Outpatient Encounter Medications as of 03/20/2023  Medication Sig   acetaminophen (TYLENOL) 500 MG tablet Take 500 mg by mouth every 6 (six) hours as needed.   amLODipine (NORVASC) 5 MG tablet Take 1 tablet (5 mg total) by mouth daily.   bisoprolol (ZEBETA) 5 MG tablet Take 1 tablet (5 mg total) by mouth daily.   calcium carbonate (TUMS EX) 750 MG chewable tablet Chew 1 tablet by mouth daily.   celecoxib (CELEBREX) 200 MG capsule Take 1 capsule (200 mg total) by mouth 2 (two) times daily.   Cholecalciferol (D3 5000 PO) Take by mouth.   hydrochlorothiazide (HYDRODIURIL) 12.5 MG tablet Take 1 tablet (12.5 mg total) by mouth daily.   loratadine (CLARITIN REDITABS) 10 MG dissolvable tablet Take 10 mg by mouth daily.   Multiple Vitamin (ONE-A-DAY MENS PO) Take 1 tablet by mouth daily.    omeprazole (PRILOSEC OTC) 20 MG tablet Take 20 mg by mouth daily.   OVER THE COUNTER MEDICATION Golden Revive   OVER THE COUNTER MEDICATION CBD gummies   OVER THE COUNTER MEDICATION Delta 8/9   rosuvastatin (CRESTOR) 5 MG tablet Take 1 tablet (5 mg total) by mouth daily.   [DISCONTINUED] gabapentin (NEURONTIN) 300 MG capsule Take one tab po qhs for spasm   [DISCONTINUED] oxyCODONE (OXY IR/ROXICODONE) 5 MG immediate release  tablet Take 1 tablet (5 mg total) by mouth every 4 (four) hours as needed for moderate pain (pain score 4-6).   [DISCONTINUED] traMADol (ULTRAM) 50 MG tablet Take one tab po tid for knee OA   [DISCONTINUED] traMADol (ULTRAM) 50 MG tablet Take 1-2 tablets (50-100 mg total) by mouth every 4 (four) hours as needed for moderate pain.   [DISCONTINUED] zolpidem (AMBIEN) 10 MG tablet Take 1 tablet (10 mg total) by mouth at bedtime as needed for sleep.   enoxaparin (LOVENOX) 40 MG/0.4ML injection Inject 0.4 mLs (40 mg total) into the skin daily for 14 days.   gabapentin (NEURONTIN) 300 MG capsule Take 2 capsules (600 mg total) by mouth at bedtime. Take one tab po qhs for spasm   traMADol (ULTRAM) 50 MG tablet Take 1 tablet (50 mg total) by mouth 3 (three) times daily as needed for moderate pain or severe pain.   zolpidem (AMBIEN) 10 MG tablet Take 1 tablet (10 mg total) by mouth at bedtime as needed for sleep.   No facility-administered encounter medications on file as of 03/20/2023.    Surgical History: Past Surgical History:  Procedure Laterality Date   APPENDECTOMY  2009   INGUINAL HERNIA REPAIR Right 11/12/2018   Procedure: HERNIA REPAIR INGUINAL ADULT;  Surgeon: Henrene Dodge, MD;  Location: [facility] ORS;  Service: General;  Laterality: Right;   KNEE ARTHROPLASTY Left 01/15/2023   Procedure: COMPUTER ASSISTED TOTAL KNEE ARTHROPLASTY;  Surgeon:  Hooten, Illene Labrador, MD;  Location: [facility] ORS;  Service: Orthopedics;  Laterality: Left;   TUMOR REMOVAL  2002   from right side of neck    Medical History: Past Medical History:  Diagnosis Date   Hyperlipidemia    Hypertension    Insomnia    Osteoarthritis     Family History: Family History  Problem Relation Age of Onset   Diabetes Mellitus II Mother    Hypertension Father     Social History   Socioeconomic History   Marital status: Widowed    Spouse name: Not on file   Number of children: 3   Years of education: Not on file   Highest education  level: Not on file  Occupational History   Not on file  Tobacco Use   Smoking status: Former    Types: Cigarettes    Quit date: 11/19/2011    Years since quitting: 11.3   Smokeless tobacco: Never  Vaping Use   Vaping Use: Never used  Substance and Sexual Activity   Alcohol use: No    Alcohol/week: 0.0 standard drinks of alcohol   Drug use: No    Comment: cbd gummies   Sexual activity: Not on file  Other Topics Concern   Not on file  Social History Narrative   Not on file   Social Determinants of Health   Financial Resource Strain: Low Risk  (09/28/2021)   Overall Financial Resource Strain (CARDIA)    Difficulty of Paying Living Expenses: Not very hard  Food Insecurity: No Food Insecurity (01/15/2023)   Hunger Vital Sign    Worried About Running Out of Food in the Last Year: Never true    Ran Out of Food in the Last Year: Never true  Transportation Needs: No Transportation Needs (01/15/2023)   PRAPARE - Administrator, Civil Service (Medical): No    Lack of Transportation (Non-Medical): No  Physical Activity: Not on file  Stress: Not on file  Social Connections: Not on file  Intimate Partner Violence: Not At Risk (01/15/2023)   Humiliation, Afraid, Rape, and Kick questionnaire    Fear of Current or Ex-Partner: No    Emotionally Abused: No    Physically Abused: No    Sexually Abused: No      Review of Systems  Constitutional:  Negative for chills, fatigue and unexpected weight change.  HENT:  Negative for congestion, rhinorrhea, sneezing and sore throat.   Eyes:  Negative for redness.  Respiratory:  Negative for cough, chest tightness and shortness of breath.   Cardiovascular:  Negative for chest pain and palpitations.  Gastrointestinal:  Negative for abdominal pain, constipation, diarrhea, nausea and vomiting.  Genitourinary:  Negative for dysuria and frequency.  Musculoskeletal:  Positive for arthralgias. Negative for back pain, joint swelling and neck  pain.  Skin:  Negative for rash.  Neurological: Negative.  Negative for tremors and numbness.  Hematological:  Negative for adenopathy. Does not bruise/bleed easily.  Psychiatric/Behavioral:  Negative for behavioral problems (Depression), sleep disturbance and suicidal ideas. The patient is not nervous/anxious.     Vital Signs: BP 130/80   Pulse 73   Temp 98.2 F (36.8 C)   Resp 16   Ht 5\' 11"  (1.803 m)   Wt 190 lb 9.6 oz (86.5 kg)   SpO2 94%   BMI 26.58 kg/m    Physical Exam Vitals reviewed.  Constitutional:      General: He is not in acute distress.    Appearance: Normal  appearance. He is not ill-appearing.  HENT:     Head: Normocephalic and atraumatic.  Eyes:     Pupils: Pupils are equal, round, and reactive to light.  Cardiovascular:     Rate and Rhythm: Normal rate and regular rhythm.  Pulmonary:     Effort: Pulmonary effort is normal. No respiratory distress.  Neurological:     Mental Status: He is alert and oriented to person, place, and time.     Cranial Nerves: No cranial nerve deficit.     Coordination: Coordination normal.     Gait: Gait normal.  Psychiatric:        Mood and Affect: Mood normal.        Behavior: Behavior normal.        Assessment/Plan: 1. Primary hypertension Stable, continue medications as prescribed.   2. Bilateral leg pain Continue increased dose of gabapentin, prescription updated, take as prescribed.  - gabapentin (NEURONTIN) 300 MG capsule; Take 2 capsules (600 mg total) by mouth at bedtime.  Dispense: 180 capsule; Refill: 3  3. Chronic pain of left knee 3 months of refills ordered. Continue as prescribed.  - traMADol (ULTRAM) 50 MG tablet; Take 1 tablet (50 mg total) by mouth 3 (three) times daily as needed for moderate pain or severe pain.  Dispense: 90 tablet; Refill: 2  4. Primary insomnia Continue ambien as prescribed.  - zolpidem (AMBIEN) 10 MG tablet; Take 1 tablet (10 mg total) by mouth at bedtime as needed for  sleep.  Dispense: 30 tablet; Refill: 2   General Counseling: Terry Harvey verbalizes understanding of the findings of todays visit and agrees with plan of treatment. I have discussed any further diagnostic evaluation that may be needed or ordered today. We also reviewed his medications today. he has been encouraged to call the office with any questions or concerns that should arise related to todays visit.    No orders of the defined types were placed in this encounter.   Meds ordered this encounter  Medications   gabapentin (NEURONTIN) 300 MG capsule    Sig: Take 2 capsules (600 mg total) by mouth at bedtime. Take one tab po qhs for spasm    Dispense:  180 capsule    Refill:  3    Note increased dose for next refill.   traMADol (ULTRAM) 50 MG tablet    Sig: Take 1 tablet (50 mg total) by mouth 3 (three) times daily as needed for moderate pain or severe pain.    Dispense:  90 tablet    Refill:  2   zolpidem (AMBIEN) 10 MG tablet    Sig: Take 1 tablet (10 mg total) by mouth at bedtime as needed for sleep.    Dispense:  30 tablet    Refill:  2    Fill new script today    Return in about 3 months (around 06/16/2023) for F/U, pain med refill, Terry Harvey PCP.   Total time spent:30 Minutes Time spent includes review of chart, medications, test results, and follow up plan with the patient.   [place] Controlled Substance Database was reviewed by me.  This patient was seen by Sallyanne Kuster, FNP-C in collaboration with Dr. Beverely Risen as a part of collaborative care agreement.   Makaylah Oddo R. Tedd Sias, MSN, FNP-C Internal medicine

## 2023-03-24 ENCOUNTER — Encounter: Payer: Self-pay | Admitting: Nurse Practitioner

## 2023-03-24 MED ORDER — PANTOPRAZOLE SODIUM 40 MG PO TBEC: 40.0000 mg | DELAYED_RELEASE_TABLET | Freq: Two times a day (BID) | ORAL | 2 refills | Status: DC

## 2023-03-26 ENCOUNTER — Telehealth: Payer: Self-pay | Admitting: Nurse Practitioner

## 2023-03-26 NOTE — Telephone Encounter (Signed)
MR from 11/18/2021 to present uploaded to Episource; [email or web address]

## 2023-03-30 ENCOUNTER — Other Ambulatory Visit: Payer: Self-pay | Admitting: Nurse Practitioner

## 2023-03-30 ENCOUNTER — Encounter: Payer: Self-pay | Admitting: Nurse Practitioner

## 2023-04-03 ENCOUNTER — Other Ambulatory Visit: Payer: Self-pay | Admitting: Nurse Practitioner

## 2023-04-30 ENCOUNTER — Other Ambulatory Visit: Payer: Self-pay | Admitting: Nurse Practitioner

## 2023-04-30 DIAGNOSIS — I10 Essential (primary) hypertension: Secondary | ICD-10-CM

## 2023-05-02 ENCOUNTER — Telehealth: Payer: Self-pay

## 2023-05-02 NOTE — Telephone Encounter (Signed)
Completed PA for patient's Ambien.

## 2023-05-12 NOTE — Progress Notes (Signed)
[facility] Quality Team Note  Name: Terry Harvey. Date of Birth: 17-May-1951 MRN: [id number] Date: 05/12/2023  [facility] Quality Team has reviewed this patient's chart, please see recommendations below:  [facility] Quality Other; (COL GAP- PATIENT IS DUE FOR COLON CANCER SCREENING. PLEASE ADDRESS AT NEXT OFFICE VISIT WITH PCP)

## 2023-05-30 ENCOUNTER — Other Ambulatory Visit: Payer: Self-pay | Admitting: Nurse Practitioner

## 2023-05-30 DIAGNOSIS — I10 Essential (primary) hypertension: Secondary | ICD-10-CM

## 2023-06-16 ENCOUNTER — Encounter: Payer: Self-pay | Admitting: Nurse Practitioner

## 2023-06-16 ENCOUNTER — Ambulatory Visit (INDEPENDENT_AMBULATORY_CARE_PROVIDER_SITE_OTHER): Payer: Medicare HMO | Admitting: Nurse Practitioner

## 2023-06-16 VITALS — BP 140/81 | HR 64 | Temp 97.8°F | Resp 16 | Ht 71.0 in | Wt 186.0 lb

## 2023-06-16 DIAGNOSIS — Z1212 Encounter for screening for malignant neoplasm of rectum: Secondary | ICD-10-CM

## 2023-06-16 DIAGNOSIS — I10 Essential (primary) hypertension: Secondary | ICD-10-CM

## 2023-06-16 DIAGNOSIS — G8929 Other chronic pain: Secondary | ICD-10-CM | POA: Diagnosis not present

## 2023-06-16 DIAGNOSIS — G729 Myopathy, unspecified: Secondary | ICD-10-CM

## 2023-06-16 DIAGNOSIS — M25562 Pain in left knee: Secondary | ICD-10-CM

## 2023-06-16 DIAGNOSIS — F5101 Primary insomnia: Secondary | ICD-10-CM

## 2023-06-16 DIAGNOSIS — M1990 Unspecified osteoarthritis, unspecified site: Secondary | ICD-10-CM | POA: Diagnosis not present

## 2023-06-16 DIAGNOSIS — Z1211 Encounter for screening for malignant neoplasm of colon: Secondary | ICD-10-CM

## 2023-06-16 MED ORDER — ZOLPIDEM TARTRATE 10 MG PO TABS: 10.0000 mg | ORAL_TABLET | Freq: Every evening | ORAL | 2 refills | Status: DC | PRN

## 2023-06-16 MED ORDER — HYDROCHLOROTHIAZIDE 12.5 MG PO TABS: 12.5000 mg | ORAL_TABLET | Freq: Every day | ORAL | 3 refills | Status: DC

## 2023-06-16 MED ORDER — TRAMADOL HCL 50 MG PO TABS: 50.0000 mg | ORAL_TABLET | Freq: Three times a day (TID) | ORAL | 2 refills | Status: DC | PRN

## 2023-06-16 NOTE — Progress Notes (Signed)
[facility] [address] [place], [place] [address]  Internal MEDICINE  Office Visit Note  Patient Name: Terry Harvey  [id number]  [id number]  Date of Service: 06/16/2023  Chief Complaint  Patient presents with   Follow-up    Med refills    Quality Metric Gaps    cologuard    HPI Terry Harvey presents for a follow-up visit for chronic pain, medication refills and routine screenings. Chronic pain -- needs refills of tramadol Insomnia -- takes [place]-- due for refills Has bilateral soreness/pain in both arms from shoulders down to thumbs in straight line, sensitive to heat or cold.  Noticed more since switching from aleve to celebrex CRC screening -- due for screening, opted for cologuard.  Hypertension -- controlled with current medications.     Current Medication: Outpatient Encounter Medications as of 06/16/2023  Medication Sig   acetaminophen (TYLENOL) 500 MG tablet Take 500 mg by mouth every 6 (six) hours as needed.   amLODipine (NORVASC) 5 MG tablet TAKE 1 TABLET (5 MG TOTAL) BY MOUTH DAILY.   bisoprolol (ZEBETA) 5 MG tablet TAKE 1 TABLET (5 MG TOTAL) BY MOUTH DAILY.   calcium carbonate (TUMS EX) 750 MG chewable tablet Chew 1 tablet by mouth daily.   celecoxib (CELEBREX) 200 MG capsule Take 1 capsule (200 mg total) by mouth 2 (two) times daily.   Cholecalciferol (D3 5000 PO) Take by mouth.   gabapentin (NEURONTIN) 300 MG capsule Take 2 capsules (600 mg total) by mouth at bedtime.   loratadine (CLARITIN REDITABS) 10 MG dissolvable tablet Take 10 mg by mouth daily.   Multiple Vitamin (ONE-A-DAY MENS PO) Take 1 tablet by mouth daily.    OVER THE COUNTER MEDICATION Golden Revive   OVER THE COUNTER MEDICATION CBD gummies   OVER THE COUNTER MEDICATION Delta 8/9   pantoprazole (PROTONIX) 40 MG tablet TAKE 1 TABLET BY MOUTH TWICE A DAY   rosuvastatin (CRESTOR) 5 MG tablet Take 1 tablet (5 mg total) by mouth daily.   [DISCONTINUED] hydrochlorothiazide (HYDRODIURIL)  12.5 MG tablet Take 1 tablet (12.5 mg total) by mouth daily.   [DISCONTINUED] omeprazole (PRILOSEC OTC) 20 MG tablet Take 20 mg by mouth daily.   [DISCONTINUED] traMADol (ULTRAM) 50 MG tablet Take 1 tablet (50 mg total) by mouth 3 (three) times daily as needed for moderate pain or severe pain.   [DISCONTINUED] zolpidem (AMBIEN) 10 MG tablet Take 1 tablet (10 mg total) by mouth at bedtime as needed for sleep.   hydrochlorothiazide (HYDRODIURIL) 12.5 MG tablet Take 1 tablet (12.5 mg total) by mouth daily.   traMADol (ULTRAM) 50 MG tablet Take 1 tablet (50 mg total) by mouth 3 (three) times daily as needed for moderate pain or severe pain.   zolpidem (AMBIEN) 10 MG tablet Take 1 tablet (10 mg total) by mouth at bedtime as needed for sleep.   [DISCONTINUED] enoxaparin (LOVENOX) 40 MG/0.4ML injection Inject 0.4 mLs (40 mg total) into the skin daily for 14 days.   No facility-administered encounter medications on file as of 06/16/2023.    Surgical History: Past Surgical History:  Procedure Laterality Date   APPENDECTOMY  2009   INGUINAL HERNIA REPAIR Right 11/12/2018   Procedure: HERNIA REPAIR INGUINAL ADULT;  Surgeon: Henrene Dodge, MD;  Location: [facility] ORS;  Service: General;  Laterality: Right;   KNEE ARTHROPLASTY Left 01/15/2023   Procedure: COMPUTER ASSISTED TOTAL KNEE ARTHROPLASTY;  Surgeon: Donato Heinz, MD;  Location: [facility] ORS;  Service: Orthopedics;  Laterality: Left;  TUMOR REMOVAL  2002   from right side of neck    Medical History: Past Medical History:  Diagnosis Date   Hyperlipidemia    Hypertension    Insomnia    Osteoarthritis     Family History: Family History  Problem Relation Age of Onset   Diabetes Mellitus II Mother    Hypertension Father     Social History   Socioeconomic History   Marital status: Widowed    Spouse name: Not on file   Number of children: 3   Years of education: Not on file   Highest education level: Not on file  Occupational History    Not on file  Tobacco Use   Smoking status: Former    Current packs/day: 0.00    Types: Cigarettes    Quit date: 11/19/2011    Years since quitting: 11.5   Smokeless tobacco: Never  Vaping Use   Vaping status: Never Used  Substance and Sexual Activity   Alcohol use: No    Alcohol/week: 0.0 standard drinks of alcohol   Drug use: No    Comment: cbd gummies   Sexual activity: Not on file  Other Topics Concern   Not on file  Social History Narrative   Not on file   Social Determinants of Health   Financial Resource Strain: Low Risk  (09/28/2021)   Overall Financial Resource Strain (CARDIA)    Difficulty of Paying Living Expenses: Not very hard  Food Insecurity: No Food Insecurity (01/15/2023)   Hunger Vital Sign    Worried About Running Out of Food in the Last Year: Never true    Ran Out of Food in the Last Year: Never true  Transportation Needs: No Transportation Needs (01/15/2023)   PRAPARE - Administrator, Civil Service (Medical): No    Lack of Transportation (Non-Medical): No  Physical Activity: Not on file  Stress: Not on file  Social Connections: Not on file  Intimate Partner Violence: Not At Risk (01/15/2023)   Humiliation, Afraid, Rape, and Kick questionnaire    Fear of Current or Ex-Partner: No    Emotionally Abused: No    Physically Abused: No    Sexually Abused: No      Review of Systems  Constitutional:  Negative for chills, fatigue and unexpected weight change.  HENT:  Negative for congestion, rhinorrhea, sneezing and sore throat.   Eyes:  Negative for redness.  Respiratory:  Negative for cough, chest tightness and shortness of breath.   Cardiovascular:  Negative for chest pain and palpitations.  Gastrointestinal:  Negative for abdominal pain, constipation, diarrhea, nausea and vomiting.  Genitourinary:  Negative for dysuria and frequency.  Musculoskeletal:  Positive for arthralgias and myalgias (bilateral and symmetrical down both arms.).  Negative for back pain, joint swelling and neck pain.  Skin:  Negative for rash.  Neurological: Negative.  Negative for tremors and numbness.  Hematological:  Negative for adenopathy. Does not bruise/bleed easily.  Psychiatric/Behavioral:  Negative for behavioral problems (Depression), sleep disturbance and suicidal ideas. The patient is not nervous/anxious.     Vital Signs: BP 138/78 Comment: 140/81  Pulse 64   Temp 97.8 F (36.6 C)   Resp 16   Ht 5\' 11"  (1.803 m)   Wt 186 lb (84.4 kg)   SpO2 95%   BMI 25.94 kg/m    Physical Exam Vitals reviewed.  Constitutional:      General: He is not in acute distress.    Appearance: Normal appearance. He is  not ill-appearing.  HENT:     Head: Normocephalic and atraumatic.  Eyes:     Pupils: Pupils are equal, round, and reactive to light.  Cardiovascular:     Rate and Rhythm: Normal rate and regular rhythm.  Pulmonary:     Effort: Pulmonary effort is normal. No respiratory distress.  Neurological:     Mental Status: He is alert and oriented to person, place, and time.     Cranial Nerves: No cranial nerve deficit.     Coordination: Coordination normal.     Gait: Gait normal.  Psychiatric:        Mood and Affect: Mood normal.        Behavior: Behavior normal.        Assessment/Plan: 1. Myopathy Autoimmune labs ordered to rule out possible rheumatologic cause for myopathy.  - Rheumatoid Factor - ANA Direct w/Reflex if Positive - C-reactive protein - Sed Rate (ESR)  2. Primary hypertension Continue medications as prescribed. Hydrochlorothiazide refills ordered  - hydrochlorothiazide (HYDRODIURIL) 12.5 MG tablet; Take 1 tablet (12.5 mg total) by mouth daily.  Dispense: 90 tablet; Refill: 3  3. Chronic pain of left knee Continue prn tramadol as prescribed. Follow up in 3 months for additional refills.  - traMADol (ULTRAM) 50 MG tablet; Take 1 tablet (50 mg total) by mouth 3 (three) times daily as needed for moderate pain or  severe pain.  Dispense: 90 tablet; Refill: 2  4. Inflammatory arthritis Autoimmune labs ordered to rule out possible rheumatologic cause for pain - Rheumatoid Factor - ANA Direct w/Reflex if Positive - C-reactive protein - Sed Rate (ESR)  5. Screening for colorectal cancer Cologuard test ordered  - Cologuard  6. Primary insomnia Continue ambien as prescribed. Follow up in 3 months for additional refills - zolpidem (AMBIEN) 10 MG tablet; Take 1 tablet (10 mg total) by mouth at bedtime as needed for sleep.  Dispense: 30 tablet; Refill: 2   General Counseling: Shaiden verbalizes understanding of the findings of todays visit and agrees with plan of treatment. I have discussed any further diagnostic evaluation that may be needed or ordered today. We also reviewed his medications today. he has been encouraged to call the office with any questions or concerns that should arise related to todays visit.    Orders Placed This Encounter  Procedures   Rheumatoid Factor   ANA Direct w/Reflex if Positive   C-reactive protein   Sed Rate (ESR)   Cologuard    Meds ordered this encounter  Medications   traMADol (ULTRAM) 50 MG tablet    Sig: Take 1 tablet (50 mg total) by mouth 3 (three) times daily as needed for moderate pain or severe pain.    Dispense:  90 tablet    Refill:  2   zolpidem (AMBIEN) 10 MG tablet    Sig: Take 1 tablet (10 mg total) by mouth at bedtime as needed for sleep.    Dispense:  30 tablet    Refill:  2    Fill new script today   hydrochlorothiazide (HYDRODIURIL) 12.5 MG tablet    Sig: Take 1 tablet (12.5 mg total) by mouth daily.    Dispense:  90 tablet    Refill:  3    Return in about 3 months (around 09/16/2023) for F/U, pain med refill, Adante Courington PCP.   Total time spent:30 Minutes Time spent includes review of chart, medications, test results, and follow up plan with the patient.   [place] Controlled Substance Database was reviewed  by me.  This patient was seen  by Sallyanne Kuster, FNP-C in collaboration with Dr. Beverely Risen as a part of collaborative care agreement.   Teal Bontrager R. Tedd Sias, MSN, FNP-C Internal medicine

## 2023-06-17 ENCOUNTER — Encounter: Payer: Self-pay | Admitting: Nurse Practitioner

## 2023-06-17 DIAGNOSIS — M1990 Unspecified osteoarthritis, unspecified site: Secondary | ICD-10-CM | POA: Diagnosis not present

## 2023-06-17 DIAGNOSIS — G729 Myopathy, unspecified: Secondary | ICD-10-CM | POA: Diagnosis not present

## 2023-06-20 ENCOUNTER — Telehealth: Payer: Self-pay

## 2023-06-20 NOTE — Telephone Encounter (Signed)
-----   Message from [place] sent at 06/20/2023  1:06 PM EDT ----- All labs are normal, no autoimmune/inflammatory cause for arm/muscle pain according to the labs

## 2023-06-20 NOTE — Telephone Encounter (Signed)
Left message for pt to give office a call back. 

## 2023-06-20 NOTE — Telephone Encounter (Signed)
Spoke with patient regarding lab results. 

## 2023-07-01 ENCOUNTER — Other Ambulatory Visit: Payer: Self-pay | Admitting: Nurse Practitioner

## 2023-07-01 DIAGNOSIS — F5101 Primary insomnia: Secondary | ICD-10-CM

## 2023-07-07 DIAGNOSIS — Z791 Long term (current) use of non-steroidal anti-inflammatories (NSAID): Secondary | ICD-10-CM | POA: Diagnosis not present

## 2023-07-07 DIAGNOSIS — Z96659 Presence of unspecified artificial knee joint: Secondary | ICD-10-CM | POA: Diagnosis not present

## 2023-07-07 DIAGNOSIS — M1990 Unspecified osteoarthritis, unspecified site: Secondary | ICD-10-CM | POA: Diagnosis not present

## 2023-07-07 DIAGNOSIS — I10 Essential (primary) hypertension: Secondary | ICD-10-CM | POA: Diagnosis not present

## 2023-07-07 DIAGNOSIS — Z87891 Personal history of nicotine dependence: Secondary | ICD-10-CM | POA: Diagnosis not present

## 2023-07-07 DIAGNOSIS — G4700 Insomnia, unspecified: Secondary | ICD-10-CM | POA: Diagnosis not present

## 2023-07-07 DIAGNOSIS — Z820 Family history of epilepsy and other diseases of the nervous system: Secondary | ICD-10-CM | POA: Diagnosis not present

## 2023-07-07 DIAGNOSIS — Z833 Family history of diabetes mellitus: Secondary | ICD-10-CM | POA: Diagnosis not present

## 2023-07-07 DIAGNOSIS — K08409 Partial loss of teeth, unspecified cause, unspecified class: Secondary | ICD-10-CM | POA: Diagnosis not present

## 2023-07-07 DIAGNOSIS — E785 Hyperlipidemia, unspecified: Secondary | ICD-10-CM | POA: Diagnosis not present

## 2023-07-07 DIAGNOSIS — I739 Peripheral vascular disease, unspecified: Secondary | ICD-10-CM | POA: Diagnosis not present

## 2023-07-08 ENCOUNTER — Encounter: Payer: Self-pay | Admitting: Nurse Practitioner

## 2023-07-08 ENCOUNTER — Ambulatory Visit (INDEPENDENT_AMBULATORY_CARE_PROVIDER_SITE_OTHER): Payer: Medicare HMO | Admitting: Nurse Practitioner

## 2023-07-08 VITALS — BP 118/80 | HR 79 | Temp 98.2°F | Resp 16 | Ht 71.0 in | Wt 185.6 lb

## 2023-07-08 DIAGNOSIS — F5101 Primary insomnia: Secondary | ICD-10-CM | POA: Diagnosis not present

## 2023-07-08 DIAGNOSIS — R1031 Right lower quadrant pain: Secondary | ICD-10-CM | POA: Diagnosis not present

## 2023-07-08 DIAGNOSIS — T148XXA Other injury of unspecified body region, initial encounter: Secondary | ICD-10-CM

## 2023-07-08 MED ORDER — CYCLOBENZAPRINE HCL 5 MG PO TABS: 5.0000 mg | ORAL_TABLET | Freq: Three times a day (TID) | ORAL | 0 refills | Status: DC | PRN

## 2023-07-08 MED ORDER — ZALEPLON 10 MG PO CAPS: 10.0000 mg | ORAL_CAPSULE | Freq: Every evening | ORAL | 2 refills | Status: DC | PRN

## 2023-07-08 NOTE — Progress Notes (Signed)
[facility] [address] [place], [place] [address]  Internal MEDICINE  Office Visit Note  Patient Name: Terry Harvey  [id number]  [id number]  Date of Service: 07/08/2023  Chief Complaint  Patient presents with   Acute Visit   Muscle Pain    Patient mowed a very large lawn a few weeks ago with repetitive bending over motion to empty lawn mower. Having muscular pain towards lower RQ near pelvis.     HPI Terry Harvey presents for a acute sick visit for a pulled muscle.  Pulled right groin muscle over mesh from hernia repair. Needs something to help with the muscle pain, already taking tramadol regularly.  Not sleeping well. With ambien by itself. Wants to try something different.   Current Medication: Outpatient Encounter Medications as of 07/08/2023  Medication Sig   acetaminophen (TYLENOL) 500 MG tablet Take 500 mg by mouth every 6 (six) hours as needed.   amLODipine (NORVASC) 5 MG tablet TAKE 1 TABLET (5 MG TOTAL) BY MOUTH DAILY.   bisoprolol (ZEBETA) 5 MG tablet TAKE 1 TABLET (5 MG TOTAL) BY MOUTH DAILY.   calcium carbonate (TUMS EX) 750 MG chewable tablet Chew 1 tablet by mouth daily.   celecoxib (CELEBREX) 200 MG capsule Take 1 capsule (200 mg total) by mouth 2 (two) times daily.   Cholecalciferol (D3 5000 PO) Take by mouth.   cyclobenzaprine (FLEXERIL) 5 MG tablet Take 1-2 tablets (5-10 mg total) by mouth 3 (three) times daily as needed for muscle spasms.   gabapentin (NEURONTIN) 300 MG capsule Take 2 capsules (600 mg total) by mouth at bedtime.   hydrochlorothiazide (HYDRODIURIL) 12.5 MG tablet Take 1 tablet (12.5 mg total) by mouth daily.   loratadine (CLARITIN REDITABS) 10 MG dissolvable tablet Take 10 mg by mouth daily.   Multiple Vitamin (ONE-A-DAY MENS PO) Take 1 tablet by mouth daily.    OVER THE COUNTER MEDICATION Golden Revive   OVER THE COUNTER MEDICATION CBD gummies   OVER THE COUNTER MEDICATION Delta 8/9   pantoprazole (PROTONIX) 40 MG tablet TAKE 1  TABLET BY MOUTH TWICE A DAY   rosuvastatin (CRESTOR) 5 MG tablet Take 1 tablet (5 mg total) by mouth daily.   traMADol (ULTRAM) 50 MG tablet Take 1 tablet (50 mg total) by mouth 3 (three) times daily as needed for moderate pain or severe pain.   zaleplon (SONATA) 10 MG capsule Take 1 capsule (10 mg total) by mouth at bedtime as needed for sleep.   zolpidem (AMBIEN) 10 MG tablet Take 1 tablet (10 mg total) by mouth at bedtime as needed for sleep.   No facility-administered encounter medications on file as of 07/08/2023.    Surgical History: Past Surgical History:  Procedure Laterality Date   APPENDECTOMY  2009   INGUINAL HERNIA REPAIR Right 11/12/2018   Procedure: HERNIA REPAIR INGUINAL ADULT;  Surgeon: Henrene Dodge, MD;  Location: [facility] ORS;  Service: General;  Laterality: Right;   KNEE ARTHROPLASTY Left 01/15/2023   Procedure: COMPUTER ASSISTED TOTAL KNEE ARTHROPLASTY;  Surgeon: Donato Heinz, MD;  Location: [facility] ORS;  Service: Orthopedics;  Laterality: Left;   TUMOR REMOVAL  2002   from right side of neck    Medical History: Past Medical History:  Diagnosis Date   Hyperlipidemia    Hypertension    Insomnia    Osteoarthritis     Family History: Family History  Problem Relation Age of Onset   Diabetes Mellitus II Mother    Hypertension Father  Social History   Socioeconomic History   Marital status: Widowed    Spouse name: Not on file   Number of children: 3   Years of education: Not on file   Highest education level: Not on file  Occupational History   Not on file  Tobacco Use   Smoking status: Former    Current packs/day: 0.00    Types: Cigarettes    Quit date: 11/19/2011    Years since quitting: 11.6   Smokeless tobacco: Never  Vaping Use   Vaping status: Never Used  Substance and Sexual Activity   Alcohol use: No    Alcohol/week: 0.0 standard drinks of alcohol   Drug use: No    Comment: cbd gummies   Sexual activity: Not on file  Other Topics Concern    Not on file  Social History Narrative   Not on file   Social Determinants of Health   Financial Resource Strain: Low Risk  (09/28/2021)   Overall Financial Resource Strain (CARDIA)    Difficulty of Paying Living Expenses: Not very hard  Food Insecurity: No Food Insecurity (01/15/2023)   Hunger Vital Sign    Worried About Running Out of Food in the Last Year: Never true    Ran Out of Food in the Last Year: Never true  Transportation Needs: No Transportation Needs (01/15/2023)   PRAPARE - Administrator, Civil Service (Medical): No    Lack of Transportation (Non-Medical): No  Physical Activity: Not on file  Stress: Not on file  Social Connections: Not on file  Intimate Partner Violence: Not At Risk (01/15/2023)   Humiliation, Afraid, Rape, and Kick questionnaire    Fear of Current or Ex-Partner: No    Emotionally Abused: No    Physically Abused: No    Sexually Abused: No      Review of Systems  Constitutional: Negative.  Negative for appetite change, fatigue and unexpected weight change.  Respiratory: Negative.  Negative for cough, chest tightness, shortness of breath and wheezing.   Cardiovascular: Negative.  Negative for chest pain and palpitations.  Gastrointestinal: Negative.   Musculoskeletal:  Positive for arthralgias and myalgias (pain in muscles of right groin area).  Psychiatric/Behavioral:  Positive for sleep disturbance. Negative for behavioral problems, self-injury and suicidal ideas. The patient is not nervous/anxious.     Vital Signs: BP 118/80   Pulse 79   Temp 98.2 F (36.8 C)   Resp 16   Ht 5\' 11"  (1.803 m)   Wt 185 lb 9.6 oz (84.2 kg)   SpO2 97%   BMI 25.89 kg/m    Physical Exam Vitals reviewed.  Constitutional:      General: Terry Harvey is not in acute distress.    Appearance: Normal appearance. Terry Harvey is normal weight. Terry Harvey is not ill-appearing.  HENT:     Head: Normocephalic and atraumatic.  Eyes:     Pupils: Pupils are equal, round, and  reactive to light.  Cardiovascular:     Rate and Rhythm: Normal rate and regular rhythm.  Pulmonary:     Effort: Pulmonary effort is normal. No respiratory distress.  Neurological:     Mental Status: Terry Harvey is alert and oriented to person, place, and time.  Psychiatric:        Mood and Affect: Mood normal.        Behavior: Behavior normal.        Assessment/Plan: 1. Unilateral groin pain, right Prn cyclobenzaprine prescribed.  - cyclobenzaprine (FLEXERIL) 5 MG tablet; Take  1-2 tablets (5-10 mg total) by mouth 3 (three) times daily as needed for muscle spasms.  Dispense: 60 tablet; Refill: 0  2. Pulled muscle Take cyclobenzaprine as needed for musculoskeletal pain and spasms.  - cyclobenzaprine (FLEXERIL) 5 MG tablet; Take 1-2 tablets (5-10 mg total) by mouth 3 (three) times daily as needed for muscle spasms.  Dispense: 60 tablet; Refill: 0  3. Primary insomnia Discontinue ambien, start sonata as prescribed. Follow up in 1 month to reevaluate sleep quality at Terry Harvey upcoming appt.  - zaleplon (SONATA) 10 MG capsule; Take 1 capsule (10 mg total) by mouth at bedtime as needed for sleep.  Dispense: 30 capsule; Refill: 2   General Counseling: Terry Harvey verbalizes understanding of the findings of todays visit and agrees with plan of treatment. I have discussed any further diagnostic evaluation that may be needed or ordered today. We also reviewed Terry Harvey medications today. Terry Harvey has been encouraged to call the office with any questions or concerns that should arise related to todays visit.    No orders of the defined types were placed in this encounter.   Meds ordered this encounter  Medications   cyclobenzaprine (FLEXERIL) 5 MG tablet    Sig: Take 1-2 tablets (5-10 mg total) by mouth 3 (three) times daily as needed for muscle spasms.    Dispense:  60 tablet    Refill:  0   zaleplon (SONATA) 10 MG capsule    Sig: Take 1 capsule (10 mg total) by mouth at bedtime as needed for sleep.    Dispense:   30 capsule    Refill:  2    Fill new script today, discontinue zolpidem now.    Return for previously scheduled, Jamie Hafford PCP has upcoming appt soon .   Total time spent:30 Minutes Time spent includes review of chart, medications, test results, and follow up plan with the patient.   [place] Controlled Substance Database was reviewed by me.  This patient was seen by Sallyanne Kuster, FNP-C in collaboration with Dr. Beverely Risen as a part of collaborative care agreement.   Juliya Magill R. Tedd Sias, MSN, FNP-C Internal medicine

## 2023-07-27 ENCOUNTER — Encounter: Payer: Self-pay | Admitting: Nurse Practitioner

## 2023-08-02 DIAGNOSIS — Z1211 Encounter for screening for malignant neoplasm of colon: Secondary | ICD-10-CM | POA: Diagnosis not present

## 2023-08-02 DIAGNOSIS — Z1212 Encounter for screening for malignant neoplasm of rectum: Secondary | ICD-10-CM | POA: Diagnosis not present

## 2023-08-09 LAB — COLOGUARD: COLOGUARD: NEGATIVE

## 2023-08-29 ENCOUNTER — Other Ambulatory Visit: Payer: Self-pay | Admitting: Nurse Practitioner

## 2023-08-29 DIAGNOSIS — R1031 Right lower quadrant pain: Secondary | ICD-10-CM

## 2023-08-29 DIAGNOSIS — T148XXA Other injury of unspecified body region, initial encounter: Secondary | ICD-10-CM

## 2023-09-07 ENCOUNTER — Other Ambulatory Visit: Payer: Self-pay | Admitting: Nurse Practitioner

## 2023-09-07 DIAGNOSIS — R1031 Right lower quadrant pain: Secondary | ICD-10-CM

## 2023-09-07 DIAGNOSIS — T148XXA Other injury of unspecified body region, initial encounter: Secondary | ICD-10-CM

## 2023-09-16 ENCOUNTER — Ambulatory Visit: Payer: Medicare HMO | Admitting: Nurse Practitioner

## 2023-09-24 ENCOUNTER — Telehealth: Payer: Self-pay

## 2023-09-24 NOTE — Telephone Encounter (Signed)
Pt advise cologuard is negative

## 2023-09-24 NOTE — Telephone Encounter (Signed)
-----   Message from [facility] sent at 09/24/2023 10:53 AM EST ----- Normal cologaurd

## 2023-10-07 ENCOUNTER — Encounter: Payer: Self-pay | Admitting: Nurse Practitioner

## 2023-10-07 ENCOUNTER — Ambulatory Visit (INDEPENDENT_AMBULATORY_CARE_PROVIDER_SITE_OTHER): Payer: Medicare HMO | Admitting: Nurse Practitioner

## 2023-10-07 ENCOUNTER — Telehealth: Payer: Self-pay | Admitting: Nurse Practitioner

## 2023-10-07 VITALS — BP 120/78 | HR 64 | Temp 98.6°F | Resp 16 | Ht 71.0 in | Wt 193.0 lb

## 2023-10-07 DIAGNOSIS — K219 Gastro-esophageal reflux disease without esophagitis: Secondary | ICD-10-CM

## 2023-10-07 DIAGNOSIS — Z0000 Encounter for general adult medical examination without abnormal findings: Secondary | ICD-10-CM

## 2023-10-07 DIAGNOSIS — M25512 Pain in left shoulder: Secondary | ICD-10-CM

## 2023-10-07 DIAGNOSIS — G8929 Other chronic pain: Secondary | ICD-10-CM | POA: Diagnosis not present

## 2023-10-07 DIAGNOSIS — M25562 Pain in left knee: Secondary | ICD-10-CM

## 2023-10-07 DIAGNOSIS — F5101 Primary insomnia: Secondary | ICD-10-CM | POA: Diagnosis not present

## 2023-10-07 MED ORDER — PANTOPRAZOLE SODIUM 40 MG PO TBEC: 40.0000 mg | DELAYED_RELEASE_TABLET | Freq: Two times a day (BID) | ORAL | 1 refills | Status: DC

## 2023-10-07 MED ORDER — TRAMADOL HCL 50 MG PO TABS: 50.0000 mg | ORAL_TABLET | Freq: Three times a day (TID) | ORAL | 2 refills | Status: DC | PRN

## 2023-10-07 MED ORDER — ZALEPLON 10 MG PO CAPS: 10.0000 mg | ORAL_CAPSULE | Freq: Every evening | ORAL | 2 refills | Status: DC | PRN

## 2023-10-07 NOTE — Telephone Encounter (Signed)
Awaiting 10/07/23 office notes for Orthopedic referral-[facility]

## 2023-10-07 NOTE — Progress Notes (Signed)
[facility] [address] [place], [place] [address]  Internal MEDICINE  Office Visit Note  Patient Name: Terry Harvey  [id number]  [id number]  Date of Service: 10/07/2023  Chief Complaint  Patient presents with   Hypertension   Hyperlipidemia   Medicare Wellness    HPI Cherre Huger presents for an annual well visit and physical exam.  Well-appearing 72 y.o. male with hypertension, osteoarthritis and high cholesterol.  Routine CRC screening: cologuard was negative in September this year Labs: will order labs including PSA New or worsening pain: none  Other concerns: updated medication list, has stopped taking some medications he does not need anymore. Sonata, CBD and melatonin 20 mg  for sleep      10/07/2023    2:04 PM 10/01/2022    1:52 PM 09/25/2021    3:49 PM  MMSE - Mini Mental State Exam  Orientation to time 5 5 5   Orientation to Place 5 5 5   Registration 3 3 3   Attention/ Calculation 5 5 5   Recall 3 3 3   Language- name 2 objects 2 2 2   Language- repeat 1 1 1   Language- follow 3 step command 3 3 3   Language- read & follow direction 1 1 1   Write a sentence 1 1 1   Copy design 1 1 1   Total score 30 30 30     Functional Status Survey: Is the patient deaf or have difficulty hearing?: No Does the patient have difficulty seeing, even when wearing glasses/contacts?: No Does the patient have difficulty concentrating, remembering, or making decisions?: No Does the patient have difficulty walking or climbing stairs?: No Does the patient have difficulty dressing or bathing?: No Does the patient have difficulty doing errands alone such as visiting a doctor's office or shopping?: No     07/12/2022   10:46 AM 10/01/2022    1:50 PM 12/30/2022    2:55 PM 06/16/2023    2:44 PM 10/07/2023    2:03 PM  Fall Risk  Falls in the past year? 0 0 1 0 0  Was there an injury with Fall?  0 0  0  Fall Risk Category Calculator  0 1  0  Fall Risk Category (Retired)  Low      (RETIRED) Patient Fall Risk Level  Low fall risk     Patient at Risk for Falls Due to  No Fall Risks No Fall Risks  No Fall Risks  Fall risk Follow up  Falls evaluation completed Falls evaluation completed Falls evaluation completed Falls evaluation completed       10/07/2023    2:03 PM  Depression screen PHQ 2/9  Decreased Interest 0  Down, Depressed, Hopeless 0  PHQ - 2 Score 0        Current Medication: Outpatient Encounter Medications as of 10/07/2023  Medication Sig   acetaminophen (TYLENOL) 500 MG tablet Take 500 mg by mouth every 6 (six) hours as needed.   amLODipine (NORVASC) 5 MG tablet TAKE 1 TABLET (5 MG TOTAL) BY MOUTH DAILY.   bisoprolol (ZEBETA) 5 MG tablet TAKE 1 TABLET (5 MG TOTAL) BY MOUTH DAILY.   calcium carbonate (TUMS EX) 750 MG chewable tablet Chew 1 tablet by mouth daily.   Cholecalciferol (D3 5000 PO) Take by mouth.   hydrochlorothiazide (HYDRODIURIL) 12.5 MG tablet Take 1 tablet (12.5 mg total) by mouth daily.   Multiple Vitamin (ONE-A-DAY MENS PO) Take 1 tablet by mouth daily.    OVER THE COUNTER MEDICATION Renette Butters Revive  OVER THE COUNTER MEDICATION CBD gummies   OVER THE COUNTER MEDICATION Delta 8/9   rosuvastatin (CRESTOR) 5 MG tablet Take 1 tablet (5 mg total) by mouth daily.   [DISCONTINUED] celecoxib (CELEBREX) 200 MG capsule Take 1 capsule (200 mg total) by mouth 2 (two) times daily.   [DISCONTINUED] cyclobenzaprine (FLEXERIL) 5 MG tablet TAKE 1-2 TABLETS BY MOUTH 3 TIMES DAILY AS NEEDED FOR MUSCLE SPASMS.   [DISCONTINUED] gabapentin (NEURONTIN) 300 MG capsule Take 2 capsules (600 mg total) by mouth at bedtime.   [DISCONTINUED] loratadine (CLARITIN REDITABS) 10 MG dissolvable tablet Take 10 mg by mouth daily.   [DISCONTINUED] pantoprazole (PROTONIX) 40 MG tablet TAKE 1 TABLET BY MOUTH TWICE A DAY   [DISCONTINUED] traMADol (ULTRAM) 50 MG tablet Take 1 tablet (50 mg total) by mouth 3 (three) times daily as needed for moderate pain or severe pain.    [DISCONTINUED] zaleplon (SONATA) 10 MG capsule Take 1 capsule (10 mg total) by mouth at bedtime as needed for sleep.   [DISCONTINUED] zolpidem (AMBIEN) 10 MG tablet Take 1 tablet (10 mg total) by mouth at bedtime as needed for sleep.   pantoprazole (PROTONIX) 40 MG tablet Take 1 tablet (40 mg total) by mouth 2 (two) times daily.   traMADol (ULTRAM) 50 MG tablet Take 1 tablet (50 mg total) by mouth 3 (three) times daily as needed for moderate pain (pain score 4-6) or severe pain (pain score 7-10).   zaleplon (SONATA) 10 MG capsule Take 1 capsule (10 mg total) by mouth at bedtime as needed for sleep.   No facility-administered encounter medications on file as of 10/07/2023.    Surgical History: Past Surgical History:  Procedure Laterality Date   APPENDECTOMY  2009   INGUINAL HERNIA REPAIR Right 11/12/2018   Procedure: HERNIA REPAIR INGUINAL ADULT;  Surgeon: Henrene Dodge, MD;  Location: [facility] ORS;  Service: General;  Laterality: Right;   KNEE ARTHROPLASTY Left 01/15/2023   Procedure: COMPUTER ASSISTED TOTAL KNEE ARTHROPLASTY;  Surgeon: Donato Heinz, MD;  Location: [facility] ORS;  Service: Orthopedics;  Laterality: Left;   TUMOR REMOVAL  2002   from right side of neck    Medical History: Past Medical History:  Diagnosis Date   Hyperlipidemia    Hypertension    Insomnia    Osteoarthritis     Family History: Family History  Problem Relation Age of Onset   Diabetes Mellitus II Mother    Hypertension Father     Social History   Socioeconomic History   Marital status: Widowed    Spouse name: Not on file   Number of children: 3   Years of education: Not on file   Highest education level: Not on file  Occupational History   Not on file  Tobacco Use   Smoking status: Former    Current packs/day: 0.00    Types: Cigarettes    Quit date: 11/19/2011    Years since quitting: 11.8   Smokeless tobacco: Never  Vaping Use   Vaping status: Never Used  Substance and Sexual Activity    Alcohol use: No    Alcohol/week: 0.0 standard drinks of alcohol   Drug use: No    Comment: cbd gummies   Sexual activity: Not on file  Other Topics Concern   Not on file  Social History Narrative   Not on file   Social Determinants of Health   Financial Resource Strain: Low Risk  (09/28/2021)   Overall Financial Resource Strain (CARDIA)    Difficulty of  Paying Living Expenses: Not very hard  Food Insecurity: No Food Insecurity (01/15/2023)   Hunger Vital Sign    Worried About Running Out of Food in the Last Year: Never true    Ran Out of Food in the Last Year: Never true  Transportation Needs: No Transportation Needs (01/15/2023)   PRAPARE - Administrator, Civil Service (Medical): No    Lack of Transportation (Non-Medical): No  Physical Activity: Not on file  Stress: Not on file  Social Connections: Not on file  Intimate Partner Violence: Not At Risk (01/15/2023)   Humiliation, Afraid, Rape, and Kick questionnaire    Fear of Current or Ex-Partner: No    Emotionally Abused: No    Physically Abused: No    Sexually Abused: No      Review of Systems  Constitutional:  Positive for fatigue. Negative for activity change, appetite change, chills, fever and unexpected weight change.  HENT: Negative.  Negative for congestion, ear pain, rhinorrhea, sore throat and trouble swallowing.   Eyes: Negative.   Respiratory: Negative.  Negative for cough, chest tightness, shortness of breath and wheezing.   Cardiovascular: Negative.  Negative for chest pain and palpitations.  Gastrointestinal: Negative.  Negative for abdominal pain, blood in stool, constipation, diarrhea, nausea and vomiting.  Endocrine: Negative for polydipsia and polyuria.  Genitourinary:  Negative for difficulty urinating, dysuria, frequency, hematuria and urgency.  Musculoskeletal:  Positive for arthralgias (left shoulder) and myalgias (left arm). Negative for back pain, joint swelling and neck pain.  Skin:  Negative.  Negative for rash and wound.  Allergic/Immunologic: Negative.  Negative for immunocompromised state.  Neurological:  Positive for weakness and numbness (and tingling in fingers). Negative for dizziness, seizures and headaches.  Hematological: Negative.   Psychiatric/Behavioral: Negative.  Negative for behavioral problems, self-injury, sleep disturbance and suicidal ideas. The patient is not nervous/anxious.     Vital Signs: BP 120/78   Pulse 64   Temp 98.6 F (37 C)   Resp 16   Ht 5\' 11"  (1.803 m)   Wt 193 lb (87.5 kg)   SpO2 93%   BMI 26.92 kg/m    Physical Exam Vitals reviewed.  Constitutional:      General: He is awake. He is not in acute distress.    Appearance: Normal appearance. He is well-developed, well-groomed and normal weight. He is not ill-appearing or diaphoretic.  HENT:     Head: Normocephalic and atraumatic.     Right Ear: Tympanic membrane, ear canal and external ear normal.     Left Ear: Tympanic membrane, ear canal and external ear normal.     Nose: Nose normal. No congestion or rhinorrhea.     Mouth/Throat:     Lips: Pink.     Mouth: Mucous membranes are moist.     Pharynx: Oropharynx is clear. Uvula midline. No oropharyngeal exudate or posterior oropharyngeal erythema.  Eyes:     General: Lids are normal. Vision grossly intact. Gaze aligned appropriately. No scleral icterus.       Right eye: No discharge.        Left eye: No discharge.     Conjunctiva/sclera: Conjunctivae normal.     Pupils: Pupils are equal, round, and reactive to light.     Funduscopic exam:    Right eye: Red reflex present.        Left eye: Red reflex present. Neck:     Thyroid: No thyromegaly.     Vascular: No JVD.  Trachea: No tracheal deviation.  Cardiovascular:     Rate and Rhythm: Normal rate and regular rhythm.     Pulses: Normal pulses.     Heart sounds: Normal heart sounds, S1 normal and S2 normal. No murmur heard.    No friction rub. No gallop.   Pulmonary:     Effort: Pulmonary effort is normal. No accessory muscle usage or respiratory distress.     Breath sounds: Normal breath sounds and air entry. No stridor. No wheezing or rales.  Chest:     Chest wall: No tenderness.  Abdominal:     General: Bowel sounds are normal. There is no distension.     Palpations: Abdomen is soft. There is no shifting dullness, fluid wave, mass or pulsatile mass.     Tenderness: There is no abdominal tenderness. There is no guarding or rebound.  Musculoskeletal:        General: No deformity.     Right shoulder: Normal.     Left shoulder: Tenderness present. Decreased range of motion. Decreased strength.     Cervical back: Normal range of motion and neck supple.     Comments: Radiation from left shoulder down the left arm  Skin:    General: Skin is warm and dry.     Capillary Refill: Capillary refill takes less than 2 seconds.     Coloration: Skin is not pale.     Findings: No erythema or rash.  Neurological:     Mental Status: He is alert and oriented to person, place, and time.     Cranial Nerves: No cranial nerve deficit.     Motor: No abnormal muscle tone.     Coordination: Coordination normal.     Gait: Gait normal.     Deep Tendon Reflexes: Reflexes are normal and symmetric.  Psychiatric:        Mood and Affect: Mood and affect normal.        Behavior: Behavior normal. Behavior is cooperative.        Thought Content: Thought content normal.        Judgment: Judgment normal.        Assessment/Plan: 1. Encounter for subsequent annual wellness visit (AWV) in Medicare patient Age-appropriate preventive screenings and vaccinations discussed, annual physical exam completed. Routine labs for health maintenance will be ordered.Marland Kitchen PHM updated.   2. Chronic left shoulder pain Referred to orthopedic surgery - traMADol (ULTRAM) 50 MG tablet; Take 1 tablet (50 mg total) by mouth 3 (three) times daily as needed for moderate pain (pain score  4-6) or severe pain (pain score 7-10).  Dispense: 90 tablet; Refill: 2 - Ambulatory referral to Orthopedic Surgery  3. Gastroesophageal reflux disease without esophagitis Continue pantoprazole as prescribed.  - pantoprazole (PROTONIX) 40 MG tablet; Take 1 tablet (40 mg total) by mouth 2 (two) times daily.  Dispense: 180 tablet; Refill: 1  4. Chronic pain of left knee Continue tramadol as prescribed, follow up in 3 months for additional refills  - traMADol (ULTRAM) 50 MG tablet; Take 1 tablet (50 mg total) by mouth 3 (three) times daily as needed for moderate pain (pain score 4-6) or severe pain (pain score 7-10).  Dispense: 90 tablet; Refill: 2  5. Primary insomnia Continue sonata as prescribed. Ambien was previously discontinued. Follow up in 3 months for additional refills of sonata - zaleplon (SONATA) 10 MG capsule; Take 1 capsule (10 mg total) by mouth at bedtime as needed for sleep.  Dispense: 30 capsule; Refill: 2  General Counseling: Glenda verbalizes understanding of the findings of todays visit and agrees with plan of treatment. I have discussed any further diagnostic evaluation that may be needed or ordered today. We also reviewed his medications today. he has been encouraged to call the office with any questions or concerns that should arise related to todays visit.    Orders Placed This Encounter  Procedures   Ambulatory referral to Orthopedic Surgery    Meds ordered this encounter  Medications   zaleplon (SONATA) 10 MG capsule    Sig: Take 1 capsule (10 mg total) by mouth at bedtime as needed for sleep.    Dispense:  30 capsule    Refill:  2    Fill new script today, discontinue zolpidem now.   traMADol (ULTRAM) 50 MG tablet    Sig: Take 1 tablet (50 mg total) by mouth 3 (three) times daily as needed for moderate pain (pain score 4-6) or severe pain (pain score 7-10).    Dispense:  90 tablet    Refill:  2   pantoprazole (PROTONIX) 40 MG tablet    Sig: Take 1  tablet (40 mg total) by mouth 2 (two) times daily.    Dispense:  180 tablet    Refill:  1    Return in about 3 months (around 12/31/2023) for F/U, pain med refill, anxiety med refill, Taniqua Issa PCP.   Total time spent:30 Minutes Time spent includes review of chart, medications, test results, and follow up plan with the patient.   [place] Controlled Substance Database was reviewed by me.  This patient was seen by Sallyanne Kuster, FNP-C in collaboration with Dr. Beverely Risen as a part of collaborative care agreement.  Anthonyjames Bargar R. Tedd Sias, MSN, FNP-C Internal medicine

## 2023-10-08 ENCOUNTER — Telehealth: Payer: Self-pay | Admitting: Nurse Practitioner

## 2023-10-08 NOTE — Telephone Encounter (Signed)
Orthopedic referral sent via [facility] to [facility]. Notified patient's caretaker. Gave telephone # [phone]

## 2023-10-10 ENCOUNTER — Other Ambulatory Visit: Payer: Self-pay | Admitting: Nurse Practitioner

## 2023-10-11 LAB — IRON AND TIBC
Iron Saturation: 26 % (ref 15–55)
Iron: 97 ug/dL (ref 38–169)
Total Iron Binding Capacity: 374 ug/dL (ref 250–450)
UIBC: 277 ug/dL (ref 111–343)

## 2023-10-11 LAB — COMPREHENSIVE METABOLIC PANEL
ALT: 18 [IU]/L (ref 0–44)
AST: 21 [IU]/L (ref 0–40)
Albumin: 4.6 g/dL (ref 3.8–4.8)
Alkaline Phosphatase: 66 [IU]/L (ref 44–121)
BUN/Creatinine Ratio: 13 (ref 10–24)
BUN: 14 mg/dL (ref 8–27)
Bilirubin Total: 0.5 mg/dL (ref 0.0–1.2)
CO2: 26 mmol/L (ref 20–29)
Calcium: 9.4 mg/dL (ref 8.6–10.2)
Chloride: 101 mmol/L (ref 96–106)
Creatinine, Ser: 1.07 mg/dL (ref 0.76–1.27)
Globulin, Total: 2.5 g/dL (ref 1.5–4.5)
Glucose: 106 mg/dL — ABNORMAL HIGH (ref 70–99)
Potassium: 4.2 mmol/L (ref 3.5–5.2)
Sodium: 142 mmol/L (ref 134–144)
Total Protein: 7.1 g/dL (ref 6.0–8.5)
eGFR: 74 mL/min/{1.73_m2} (ref >59)

## 2023-10-11 LAB — CBC WITH DIFFERENTIAL/PLATELET
Basophils Absolute: 0.1 10*3/uL (ref 0.0–0.2)
Basos: 1 %
EOS (ABSOLUTE): 0.3 10*3/uL (ref 0.0–0.4)
Eos: 3 %
Hematocrit: 46.6 % (ref 37.5–51.0)
Hemoglobin: 15.1 g/dL (ref 13.0–17.7)
Immature Grans (Abs): 0 10*3/uL (ref 0.0–0.1)
Immature Granulocytes: 0 %
Lymphocytes Absolute: 3.1 10*3/uL (ref 0.7–3.1)
Lymphs: 41 %
MCH: 30.1 pg (ref 26.6–33.0)
MCHC: 32.4 g/dL (ref 31.5–35.7)
MCV: 93 fL (ref 79–97)
Monocytes Absolute: 0.6 10*3/uL (ref 0.1–0.9)
Monocytes: 8 %
Neutrophils Absolute: 3.6 10*3/uL (ref 1.4–7.0)
Neutrophils: 47 %
Platelets: 260 10*3/uL (ref 150–450)
RBC: 5.01 x10E6/uL (ref 4.14–5.80)
RDW: 12.4 % (ref 11.6–15.4)
WBC: 7.6 10*3/uL (ref 3.4–10.8)

## 2023-10-11 LAB — LIPID PANEL WITH LDL/HDL RATIO
Cholesterol, Total: 144 mg/dL (ref 100–199)
HDL: 41 mg/dL (ref >39)
LDL Chol Calc (NIH): 70 mg/dL (ref 0–99)
LDL/HDL Ratio: 1.7 ratio (ref 0.0–3.6)
Triglycerides: 200 mg/dL — ABNORMAL HIGH (ref 0–149)
VLDL Cholesterol Cal: 33 mg/dL (ref 5–40)

## 2023-10-11 LAB — B12 AND FOLATE PANEL
Folate: >20 ng/mL (ref >3.0)
Vitamin B-12: 590 pg/mL (ref 232–1245)

## 2023-10-11 LAB — PSA: Prostate Specific Ag, Serum: 1.1 ng/mL (ref 0.0–4.0)

## 2023-10-11 LAB — FERRITIN: Ferritin: 28 ng/mL — ABNORMAL LOW (ref 30–400)

## 2023-10-11 LAB — HGB A1C W/O EAG: Hgb A1c MFr Bld: 5.8 % — ABNORMAL HIGH (ref 4.8–5.6)

## 2023-10-20 ENCOUNTER — Telehealth: Payer: Self-pay | Admitting: Nurse Practitioner

## 2023-10-20 NOTE — Telephone Encounter (Signed)
Orthopedic appointment 10/22/2023 @ [facility]

## 2023-10-22 DIAGNOSIS — M19012 Primary osteoarthritis, left shoulder: Secondary | ICD-10-CM | POA: Diagnosis not present

## 2023-10-29 ENCOUNTER — Telehealth: Payer: Self-pay | Admitting: Nurse Practitioner

## 2023-10-29 NOTE — Telephone Encounter (Signed)
11/18/21-present MR mailed to [facility]; Attn: [facility], [address], [place], [place] [address]

## 2023-11-06 ENCOUNTER — Other Ambulatory Visit: Payer: Self-pay | Admitting: Nurse Practitioner

## 2023-11-06 DIAGNOSIS — E782 Mixed hyperlipidemia: Secondary | ICD-10-CM

## 2023-11-06 DIAGNOSIS — I10 Essential (primary) hypertension: Secondary | ICD-10-CM

## 2024-01-01 ENCOUNTER — Ambulatory Visit (INDEPENDENT_AMBULATORY_CARE_PROVIDER_SITE_OTHER): Payer: Medicare HMO | Admitting: Nurse Practitioner

## 2024-01-01 ENCOUNTER — Encounter: Payer: Self-pay | Admitting: Nurse Practitioner

## 2024-01-01 VITALS — BP 132/88 | HR 71 | Temp 98.2°F | Resp 16 | Ht 71.0 in | Wt 193.0 lb

## 2024-01-01 DIAGNOSIS — F5101 Primary insomnia: Secondary | ICD-10-CM | POA: Diagnosis not present

## 2024-01-01 DIAGNOSIS — M25562 Pain in left knee: Secondary | ICD-10-CM | POA: Diagnosis not present

## 2024-01-01 DIAGNOSIS — K219 Gastro-esophageal reflux disease without esophagitis: Secondary | ICD-10-CM | POA: Diagnosis not present

## 2024-01-01 DIAGNOSIS — M25512 Pain in left shoulder: Secondary | ICD-10-CM | POA: Diagnosis not present

## 2024-01-01 DIAGNOSIS — G8929 Other chronic pain: Secondary | ICD-10-CM

## 2024-01-01 MED ORDER — TRAMADOL HCL 50 MG PO TABS: 50.0000 mg | ORAL_TABLET | Freq: Three times a day (TID) | ORAL | 2 refills | Status: DC | PRN

## 2024-01-01 MED ORDER — ZALEPLON 10 MG PO CAPS: 10.0000 mg | ORAL_CAPSULE | Freq: Every evening | ORAL | 2 refills | Status: DC | PRN

## 2024-01-01 NOTE — Progress Notes (Signed)
 [facility] [address] [place], [place] [address]  Internal MEDICINE  Office Visit Note  Patient Name: Terry Harvey  [id number]  [id number]  Date of Service: 01/01/2024  Chief Complaint  Patient presents with   Hyperlipidemia   Hypertension   Follow-up    HPI [name] presents for a follow-up visit for GERD, insomnia, chronic knee pain Indigestion and acid reflux is improved with pantoprazole.  Insomnia -- takes zaleplon, melatonin, and CBD sometimes.  Knee pain -- takes tramadol as needed. And has his ortho appt coming up.     Current Medication: Outpatient Encounter Medications as of 01/01/2024  Medication Sig   acetaminophen (TYLENOL) 500 MG tablet Take 500 mg by mouth every 6 (six) hours as needed.   amLODipine (NORVASC) 5 MG tablet TAKE 1 TABLET (5 MG TOTAL) BY MOUTH DAILY.   bisoprolol (ZEBETA) 5 MG tablet TAKE 1 TABLET (5 MG TOTAL) BY MOUTH DAILY.   calcium carbonate (TUMS EX) 750 MG chewable tablet Chew 1 tablet by mouth daily.   Cholecalciferol (D3 5000 PO) Take by mouth.   hydrochlorothiazide (HYDRODIURIL) 12.5 MG tablet TAKE 1 TABLET BY MOUTH EVERY DAY   Multiple Vitamin (ONE-A-DAY MENS PO) Take 1 tablet by mouth daily.    OVER THE COUNTER MEDICATION Golden Revive   OVER THE COUNTER MEDICATION CBD gummies   OVER THE COUNTER MEDICATION Delta 8/9   pantoprazole (PROTONIX) 40 MG tablet Take 1 tablet (40 mg total) by mouth 2 (two) times daily.   rosuvastatin (CRESTOR) 5 MG tablet TAKE 1 TABLET (5 MG TOTAL) BY MOUTH DAILY.   [DISCONTINUED] traMADol (ULTRAM) 50 MG tablet Take 1 tablet (50 mg total) by mouth 3 (three) times daily as needed for moderate pain (pain score 4-6) or severe pain (pain score 7-10).   [DISCONTINUED] zaleplon (SONATA) 10 MG capsule Take 1 capsule (10 mg total) by mouth at bedtime as needed for sleep.   traMADol (ULTRAM) 50 MG tablet Take 1 tablet (50 mg total) by mouth 3 (three) times daily as needed for moderate pain (pain score  4-6) or severe pain (pain score 7-10).   zaleplon (SONATA) 10 MG capsule Take 1 capsule (10 mg total) by mouth at bedtime as needed for sleep.   No facility-administered encounter medications on file as of 01/01/2024.    Surgical History: Past Surgical History:  Procedure Laterality Date   APPENDECTOMY  2009   INGUINAL HERNIA REPAIR Right 11/12/2018   Procedure: HERNIA REPAIR INGUINAL ADULT;  Surgeon: Henrene Dodge, MD;  Location: [facility] ORS;  Service: General;  Laterality: Right;   KNEE ARTHROPLASTY Left 01/15/2023   Procedure: COMPUTER ASSISTED TOTAL KNEE ARTHROPLASTY;  Surgeon: Donato Heinz, MD;  Location: [facility] ORS;  Service: Orthopedics;  Laterality: Left;   TUMOR REMOVAL  2002   from right side of neck    Medical History: Past Medical History:  Diagnosis Date   Hyperlipidemia    Hypertension    Insomnia    Osteoarthritis     Family History: Family History  Problem Relation Age of Onset   Diabetes Mellitus II Mother    Hypertension Father     Social History   Socioeconomic History   Marital status: Widowed    Spouse name: Not on file   Number of children: 3   Years of education: Not on file   Highest education level: Not on file  Occupational History   Not on file  Tobacco Use   Smoking status: Former  Current packs/day: 0.00    Types: Cigarettes    Quit date: 11/19/2011    Years since quitting: 12.1   Smokeless tobacco: Never  Vaping Use   Vaping status: Never Used  Substance and Sexual Activity   Alcohol use: No    Alcohol/week: 0.0 standard drinks of alcohol   Drug use: No    Comment: cbd gummies   Sexual activity: Not on file  Other Topics Concern   Not on file  Social History Narrative   Not on file   Social Drivers of Health   Financial Resource Strain: Low Risk  (09/28/2021)   Overall Financial Resource Strain (CARDIA)    Difficulty of Paying Living Expenses: Not very hard  Food Insecurity: No Food Insecurity (01/15/2023)   Hunger Vital  Sign    Worried About Running Out of Food in the Last Year: Never true    Ran Out of Food in the Last Year: Never true  Transportation Needs: No Transportation Needs (01/15/2023)   PRAPARE - Administrator, Civil Service (Medical): No    Lack of Transportation (Non-Medical): No  Physical Activity: Not on file  Stress: Not on file  Social Connections: Not on file  Intimate Partner Violence: Not At Risk (01/15/2023)   Humiliation, Afraid, Rape, and Kick questionnaire    Fear of Current or Ex-Partner: No    Emotionally Abused: No    Physically Abused: No    Sexually Abused: No      Review of Systems  Constitutional:  Negative for chills, fatigue and unexpected weight change.  HENT:  Negative for congestion, rhinorrhea, sneezing and sore throat.   Eyes:  Negative for redness.  Respiratory:  Negative for cough, chest tightness and shortness of breath.   Cardiovascular:  Negative for chest pain and palpitations.  Gastrointestinal:  Negative for abdominal pain, constipation, diarrhea, nausea and vomiting.  Genitourinary:  Negative for dysuria and frequency.  Musculoskeletal:  Positive for arthralgias. Negative for back pain, joint swelling and neck pain.  Skin:  Negative for rash.  Neurological: Negative.  Negative for tremors and numbness.  Hematological:  Negative for adenopathy. Does not bruise/bleed easily.  Psychiatric/Behavioral:  Negative for behavioral problems (Depression), sleep disturbance and suicidal ideas. The patient is not nervous/anxious.     Vital Signs: BP 132/88   Pulse 71   Temp 98.2 F (36.8 C)   Resp 16   Ht 5\' 11"  (1.803 m)   Wt 193 lb (87.5 kg)   SpO2 93%   BMI 26.92 kg/m    Physical Exam Vitals reviewed.  Constitutional:      General: He is not in acute distress.    Appearance: Normal appearance. He is not ill-appearing.  HENT:     Head: Normocephalic and atraumatic.  Eyes:     Pupils: Pupils are equal, round, and reactive to light.   Cardiovascular:     Rate and Rhythm: Normal rate and regular rhythm.  Pulmonary:     Effort: Pulmonary effort is normal. No respiratory distress.  Neurological:     Mental Status: He is alert and oriented to person, place, and time.     Cranial Nerves: No cranial nerve deficit.     Coordination: Coordination normal.     Gait: Gait normal.  Psychiatric:        Mood and Affect: Mood normal.        Behavior: Behavior normal.        Assessment/Plan: 1. Gastroesophageal reflux disease without esophagitis (  Primary) Continue pantoprazole as prescribed.   2. Chronic pain of left knee Continue prn tramadol as prescribed. Follow up in 3 months for additional refills.  - traMADol (ULTRAM) 50 MG tablet; Take 1 tablet (50 mg total) by mouth 3 (three) times daily as needed for moderate pain (pain score 4-6) or severe pain (pain score 7-10).  Dispense: 90 tablet; Refill: 2  3. Chronic left shoulder pain Continue prn tramadol as prescribed. Follow up in 3 months for additional refills  - traMADol (ULTRAM) 50 MG tablet; Take 1 tablet (50 mg total) by mouth 3 (three) times daily as needed for moderate pain (pain score 4-6) or severe pain (pain score 7-10).  Dispense: 90 tablet; Refill: 2  4. Primary insomnia Continue sonata as prescribed. Follow up in 3 months for additional refills.  - zaleplon (SONATA) 10 MG capsule; Take 1 capsule (10 mg total) by mouth at bedtime as needed for sleep.  Dispense: 30 capsule; Refill: 2   General Counseling: Dierre verbalizes understanding of the findings of todays visit and agrees with plan of treatment. I have discussed any further diagnostic evaluation that may be needed or ordered today. We also reviewed his medications today. he has been encouraged to call the office with any questions or concerns that should arise related to todays visit.    No orders of the defined types were placed in this encounter.   Meds ordered this encounter  Medications    traMADol (ULTRAM) 50 MG tablet    Sig: Take 1 tablet (50 mg total) by mouth 3 (three) times daily as needed for moderate pain (pain score 4-6) or severe pain (pain score 7-10).    Dispense:  90 tablet    Refill:  2    For future refills   zaleplon (SONATA) 10 MG capsule    Sig: Take 1 capsule (10 mg total) by mouth at bedtime as needed for sleep.    Dispense:  30 capsule    Refill:  2    For future refills    Return in about 3 months (around 03/24/2024) for F/U, med refill, Noah Lembke PCP.   Total time spent:30 Minutes Time spent includes review of chart, medications, test results, and follow up plan with the patient.   [place] Controlled Substance Database was reviewed by me.  This patient was seen by Sallyanne Kuster, FNP-C in collaboration with Dr. Beverely Risen as a part of collaborative care agreement.   Wrigley Winborne R. Tedd Sias, MSN, FNP-C Internal medicine

## 2024-01-15 DIAGNOSIS — Z96652 Presence of left artificial knee joint: Secondary | ICD-10-CM | POA: Diagnosis not present

## 2024-02-04 DIAGNOSIS — H6123 Impacted cerumen, bilateral: Secondary | ICD-10-CM | POA: Diagnosis not present

## 2024-02-04 DIAGNOSIS — H93293 Other abnormal auditory perceptions, bilateral: Secondary | ICD-10-CM | POA: Diagnosis not present

## 2024-02-08 ENCOUNTER — Encounter: Payer: Self-pay | Admitting: Nurse Practitioner

## 2024-02-08 DIAGNOSIS — K219 Gastro-esophageal reflux disease without esophagitis: Secondary | ICD-10-CM | POA: Insufficient documentation

## 2024-02-08 DIAGNOSIS — G8929 Other chronic pain: Secondary | ICD-10-CM | POA: Insufficient documentation

## 2024-03-03 ENCOUNTER — Other Ambulatory Visit: Payer: Self-pay | Admitting: Nurse Practitioner

## 2024-03-03 DIAGNOSIS — K219 Gastro-esophageal reflux disease without esophagitis: Secondary | ICD-10-CM

## 2024-03-25 ENCOUNTER — Encounter: Payer: Self-pay | Admitting: Nurse Practitioner

## 2024-03-25 ENCOUNTER — Ambulatory Visit (INDEPENDENT_AMBULATORY_CARE_PROVIDER_SITE_OTHER): Payer: Medicare HMO | Admitting: Nurse Practitioner

## 2024-03-25 ENCOUNTER — Other Ambulatory Visit: Payer: Self-pay | Admitting: Nurse Practitioner

## 2024-03-25 VITALS — BP 135/80 | HR 76 | Temp 98.0°F | Resp 16 | Ht 71.0 in | Wt 203.4 lb

## 2024-03-25 DIAGNOSIS — R140 Abdominal distension (gaseous): Secondary | ICD-10-CM

## 2024-03-25 DIAGNOSIS — M25512 Pain in left shoulder: Secondary | ICD-10-CM

## 2024-03-25 DIAGNOSIS — I10 Essential (primary) hypertension: Secondary | ICD-10-CM | POA: Diagnosis not present

## 2024-03-25 DIAGNOSIS — F5101 Primary insomnia: Secondary | ICD-10-CM

## 2024-03-25 DIAGNOSIS — K5909 Other constipation: Secondary | ICD-10-CM

## 2024-03-25 DIAGNOSIS — K30 Functional dyspepsia: Secondary | ICD-10-CM | POA: Diagnosis not present

## 2024-03-25 DIAGNOSIS — M25562 Pain in left knee: Secondary | ICD-10-CM | POA: Diagnosis not present

## 2024-03-25 DIAGNOSIS — G8929 Other chronic pain: Secondary | ICD-10-CM | POA: Diagnosis not present

## 2024-03-25 MED ORDER — TRAMADOL HCL 50 MG PO TABS
50.0000 mg | ORAL_TABLET | Freq: Three times a day (TID) | ORAL | 2 refills | Status: DC | PRN
Start: 2024-03-25 — End: 2024-06-24

## 2024-03-25 MED ORDER — AMLODIPINE BESYLATE 5 MG PO TABS: 5.0000 mg | ORAL_TABLET | Freq: Every day | ORAL | 3 refills | Status: AC

## 2024-03-25 MED ORDER — LUBIPROSTONE 24 MCG PO CAPS: 24.0000 ug | ORAL_CAPSULE | Freq: Two times a day (BID) | ORAL | 5 refills | Status: DC

## 2024-03-25 MED ORDER — DICYCLOMINE HCL 20 MG PO TABS: 20.0000 mg | ORAL_TABLET | Freq: Three times a day (TID) | ORAL | 3 refills | Status: DC

## 2024-03-25 MED ORDER — BISOPROLOL FUMARATE 5 MG PO TABS: 5.0000 mg | ORAL_TABLET | Freq: Every day | ORAL | 3 refills | Status: AC

## 2024-03-25 MED ORDER — HYDROCHLOROTHIAZIDE 12.5 MG PO TABS: 12.5000 mg | ORAL_TABLET | Freq: Every day | ORAL | 1 refills | Status: DC

## 2024-03-25 MED ORDER — ESZOPICLONE 3 MG PO TABS: 3.0000 mg | ORAL_TABLET | Freq: Every day | ORAL | 2 refills | Status: DC

## 2024-03-25 NOTE — Progress Notes (Signed)
 [facility] [address] [place], [place] [address]  Internal MEDICINE  Office Visit Note  Patient Name: Terry Harvey  [id number]  [id number]  Date of Service: 03/25/2024  Chief Complaint  Patient presents with   Hyperlipidemia   Hypertension   Follow-up    HPI Terry Harvey presents for a follow-up visit for indigestion, insomnia, knee pain and refills.  Indigestion, bloating and constipation -- already on pantoprazole , has also tried gas-x and tums and still having trouble with excess gas/bloating and GERD. Has also taken stool softener and small dose of a OTC laxative.  Insomnia -- zaleplon  has not been helping very much. Also taking CBD and melatonin sometimes. Can try something else. Knee pain -- takes tramadol  as needed which is still effective for him. Due for refills.  Hypertension -- controlled with current medications, due for refills.     Current Medication: Outpatient Encounter Medications as of 03/25/2024  Medication Sig   acetaminophen  (TYLENOL ) 500 MG tablet Take 500 mg by mouth every 6 (six) hours as needed.   calcium  carbonate (TUMS EX) 750 MG chewable tablet Chew 1 tablet by mouth daily.   Cholecalciferol (D3 5000 PO) Take by mouth.   dicyclomine  (BENTYL ) 20 MG tablet Take 1 tablet (20 mg total) by mouth 4 (four) times daily -  before meals and at bedtime.   Eszopiclone  3 MG TABS Take 1 tablet (3 mg total) by mouth at bedtime. Take immediately before bedtime   lubiprostone  (AMITIZA ) 24 MCG capsule Take 1 capsule (24 mcg total) by mouth 2 (two) times daily with a meal.   Multiple Vitamin (ONE-A-DAY MENS PO) Take 1 tablet by mouth daily.    OVER THE COUNTER MEDICATION Golden Revive   OVER THE COUNTER MEDICATION CBD gummies   OVER THE COUNTER MEDICATION Delta 8/9   pantoprazole  (PROTONIX ) 40 MG tablet TAKE 1 TABLET BY MOUTH TWICE A DAY   rosuvastatin  (CRESTOR ) 5 MG tablet TAKE 1 TABLET (5 MG TOTAL) BY MOUTH DAILY.   zaleplon  (SONATA ) 10 MG capsule Take  1 capsule (10 mg total) by mouth at bedtime as needed for sleep.   [DISCONTINUED] amLODipine  (NORVASC ) 5 MG tablet TAKE 1 TABLET (5 MG TOTAL) BY MOUTH DAILY.   [DISCONTINUED] bisoprolol  (ZEBETA ) 5 MG tablet TAKE 1 TABLET (5 MG TOTAL) BY MOUTH DAILY.   [DISCONTINUED] hydrochlorothiazide  (HYDRODIURIL ) 12.5 MG tablet TAKE 1 TABLET BY MOUTH EVERY DAY   [DISCONTINUED] traMADol  (ULTRAM ) 50 MG tablet Take 1 tablet (50 mg total) by mouth 3 (three) times daily as needed for moderate pain (pain score 4-6) or severe pain (pain score 7-10).   amLODipine  (NORVASC ) 5 MG tablet Take 1 tablet (5 mg total) by mouth daily.   bisoprolol  (ZEBETA ) 5 MG tablet Take 1 tablet (5 mg total) by mouth daily.   hydrochlorothiazide  (HYDRODIURIL ) 12.5 MG tablet Take 1 tablet (12.5 mg total) by mouth daily.   traMADol  (ULTRAM ) 50 MG tablet Take 1 tablet (50 mg total) by mouth 3 (three) times daily as needed for moderate pain (pain score 4-6) or severe pain (pain score 7-10).   No facility-administered encounter medications on file as of 03/25/2024.    Surgical History: Past Surgical History:  Procedure Laterality Date   APPENDECTOMY  2009   INGUINAL HERNIA REPAIR Right 11/12/2018   Procedure: HERNIA REPAIR INGUINAL ADULT;  Surgeon: Emmalene Hare, MD;  Location: [facility] ORS;  Service: General;  Laterality: Right;   KNEE ARTHROPLASTY Left 01/15/2023   Procedure: COMPUTER ASSISTED TOTAL KNEE ARTHROPLASTY;  Surgeon: Arlyne Lame, MD;  Location: [facility] ORS;  Service: Orthopedics;  Laterality: Left;   TUMOR REMOVAL  2002   from right side of neck    Medical History: Past Medical History:  Diagnosis Date   Hyperlipidemia    Hypertension    Insomnia    Osteoarthritis     Family History: Family History  Problem Relation Age of Onset   Diabetes Mellitus II Mother    Hypertension Father     Social History   Socioeconomic History   Marital status: Widowed    Spouse name: Not on file   Number of children: 3   Years of  education: Not on file   Highest education level: Not on file  Occupational History   Not on file  Tobacco Use   Smoking status: Former    Current packs/day: 0.00    Types: Cigarettes    Quit date: 11/19/2011    Years since quitting: 12.4   Smokeless tobacco: Never  Vaping Use   Vaping status: Never Used  Substance and Sexual Activity   Alcohol use: No    Alcohol/week: 0.0 standard drinks of alcohol   Drug use: No    Comment: cbd gummies   Sexual activity: Not on file  Other Topics Concern   Not on file  Social History Narrative   Not on file   Social Drivers of Health   Financial Resource Strain: Low Risk  (09/28/2021)   Overall Financial Resource Strain (CARDIA)    Difficulty of Paying Living Expenses: Not very hard  Food Insecurity: No Food Insecurity (01/15/2023)   Hunger Vital Sign    Worried About Running Out of Food in the Last Year: Never true    Ran Out of Food in the Last Year: Never true  Transportation Needs: No Transportation Needs (01/15/2023)   PRAPARE - Administrator, Civil Service (Medical): No    Lack of Transportation (Non-Medical): No  Physical Activity: Not on file  Stress: Not on file  Social Connections: Not on file  Intimate Partner Violence: Not At Risk (01/15/2023)   Humiliation, Afraid, Rape, and Kick questionnaire    Fear of Current or Ex-Partner: No    Emotionally Abused: No    Physically Abused: No    Sexually Abused: No      Review of Systems  Constitutional:  Negative for chills, fatigue and unexpected weight change.  HENT:  Negative for congestion, rhinorrhea, sneezing and sore throat.   Eyes:  Negative for redness.  Respiratory:  Negative for cough, chest tightness and shortness of breath.   Cardiovascular:  Negative for chest pain and palpitations.  Gastrointestinal:  Positive for abdominal distention, abdominal pain, constipation and nausea. Negative for diarrhea and vomiting.  Genitourinary:  Negative for dysuria and  frequency.  Musculoskeletal:  Positive for arthralgias. Negative for back pain, joint swelling and neck pain.  Skin:  Negative for rash.  Neurological: Negative.  Negative for tremors and numbness.  Hematological:  Negative for adenopathy. Does not bruise/bleed easily.  Psychiatric/Behavioral:  Positive for sleep disturbance. Negative for behavioral problems (Depression), self-injury and suicidal ideas. The patient is not nervous/anxious.     Vital Signs: BP 135/80   Pulse 76   Temp 98 F (36.7 C)   Resp 16   Ht 5\' 11"  (1.803 m)   Wt 203 lb 6.4 oz (92.3 kg)   SpO2 96%   BMI 28.37 kg/m    Physical Exam Vitals reviewed.  Constitutional:  General: He is not in acute distress.    Appearance: Normal appearance. He is not ill-appearing.  HENT:     Head: Normocephalic and atraumatic.  Eyes:     Pupils: Pupils are equal, round, and reactive to light.  Cardiovascular:     Rate and Rhythm: Normal rate and regular rhythm.     Heart sounds: Normal heart sounds. No murmur heard. Pulmonary:     Effort: Pulmonary effort is normal. No respiratory distress.     Breath sounds: Normal breath sounds. No wheezing.  Abdominal:     General: Bowel sounds are normal. There is distension.     Palpations: Abdomen is soft.  Skin:    General: Skin is warm and dry.  Neurological:     Mental Status: He is alert and oriented to person, place, and time.     Cranial Nerves: No cranial nerve deficit.     Coordination: Coordination normal.     Gait: Gait normal.  Psychiatric:        Mood and Affect: Mood normal.        Behavior: Behavior normal.        Assessment/Plan: 1. Primary hypertension (Primary) Stable, continue bisoprolol , amlodipine  and hydrochlorothiazide  as prescribed.  - hydrochlorothiazide  (HYDRODIURIL ) 12.5 MG tablet; Take 1 tablet (12.5 mg total) by mouth daily.  Dispense: 90 tablet; Refill: 1 - bisoprolol  (ZEBETA ) 5 MG tablet; Take 1 tablet (5 mg total) by mouth daily.   Dispense: 90 tablet; Refill: 3 - amLODipine  (NORVASC ) 5 MG tablet; Take 1 tablet (5 mg total) by mouth daily.  Dispense: 90 tablet; Refill: 3  2. Abdominal bloating Amitiza  and dicyclomine  prescribed.  - lubiprostone  (AMITIZA ) 24 MCG capsule; Take 1 capsule (24 mcg total) by mouth 2 (two) times daily with a meal.  Dispense: 60 capsule; Refill: 5 - dicyclomine  (BENTYL ) 20 MG tablet; Take 1 tablet (20 mg total) by mouth 4 (four) times daily -  before meals and at bedtime.  Dispense: 120 tablet; Refill: 3  3. Indigestion Amitiza  and dicyclomine  prescribed.  - lubiprostone  (AMITIZA ) 24 MCG capsule; Take 1 capsule (24 mcg total) by mouth 2 (two) times daily with a meal.  Dispense: 60 capsule; Refill: 5  4. Other constipation Amitiza  prescribed, take as ordered  - lubiprostone  (AMITIZA ) 24 MCG capsule; Take 1 capsule (24 mcg total) by mouth 2 (two) times daily with a meal.  Dispense: 60 capsule; Refill: 5  5. Chronic pain of left knee Continue tramadol  prn as prescribed. Follow up in 3 months for additional refills.  - traMADol  (ULTRAM ) 50 MG tablet; Take 1 tablet (50 mg total) by mouth 3 (three) times daily as needed for moderate pain (pain score 4-6) or severe pain (pain score 7-10).  Dispense: 90 tablet; Refill: 2  6. Chronic left shoulder pain Continue tramadol  prn as prescribed. Follow up in 3 months for additional refills  - traMADol  (ULTRAM ) 50 MG tablet; Take 1 tablet (50 mg total) by mouth 3 (three) times daily as needed for moderate pain (pain score 4-6) or severe pain (pain score 7-10).  Dispense: 90 tablet; Refill: 2  7. Primary insomnia Try lunesta  as prescribed. Discontinue zaleplon  - Eszopiclone  3 MG TABS; Take 1 tablet (3 mg total) by mouth at bedtime. Take immediately before bedtime  Dispense: 30 tablet; Refill: 2   General Counseling: Reshard verbalizes understanding of the findings of todays visit and agrees with plan of treatment. I have discussed any further diagnostic  evaluation that may be needed or ordered  today. We also reviewed his medications today. he has been encouraged to call the office with any questions or concerns that should arise related to todays visit.    No orders of the defined types were placed in this encounter.   Meds ordered this encounter  Medications   lubiprostone  (AMITIZA ) 24 MCG capsule    Sig: Take 1 capsule (24 mcg total) by mouth 2 (two) times daily with a meal.    Dispense:  60 capsule    Refill:  5   dicyclomine  (BENTYL ) 20 MG tablet    Sig: Take 1 tablet (20 mg total) by mouth 4 (four) times daily -  before meals and at bedtime.    Dispense:  120 tablet    Refill:  3    Fill new script today   Eszopiclone  3 MG TABS    Sig: Take 1 tablet (3 mg total) by mouth at bedtime. Take immediately before bedtime    Dispense:  30 tablet    Refill:  2    Discontinue zaleplon  and fill new script today.   traMADol  (ULTRAM ) 50 MG tablet    Sig: Take 1 tablet (50 mg total) by mouth 3 (three) times daily as needed for moderate pain (pain score 4-6) or severe pain (pain score 7-10).    Dispense:  90 tablet    Refill:  2    For future refills   hydrochlorothiazide  (HYDRODIURIL ) 12.5 MG tablet    Sig: Take 1 tablet (12.5 mg total) by mouth daily.    Dispense:  90 tablet    Refill:  1   bisoprolol  (ZEBETA ) 5 MG tablet    Sig: Take 1 tablet (5 mg total) by mouth daily.    Dispense:  90 tablet    Refill:  3   amLODipine  (NORVASC ) 5 MG tablet    Sig: Take 1 tablet (5 mg total) by mouth daily.    Dispense:  90 tablet    Refill:  3    Return in about 12 weeks (around 06/17/2024) for F/U, pain med refill, Phelicia Dantes PCP lunesta  and tramadol . .   Total time spent:30 Minutes Time spent includes review of chart, medications, test results, and follow up plan with the patient.   [place] Controlled Substance Database was reviewed by me.  This patient was seen by Laurence Pons, FNP-C in collaboration with Dr. Verneta Gone as a part of  collaborative care agreement.   Jazaria Jarecki R. Bobbi Burow, MSN, FNP-C Internal medicine

## 2024-03-25 NOTE — Telephone Encounter (Signed)
 Please review need PA

## 2024-03-26 ENCOUNTER — Other Ambulatory Visit: Payer: Self-pay | Admitting: Nurse Practitioner

## 2024-04-05 NOTE — Telephone Encounter (Signed)
 Please review

## 2024-04-24 ENCOUNTER — Encounter: Payer: Self-pay | Admitting: Nurse Practitioner

## 2024-05-04 ENCOUNTER — Other Ambulatory Visit: Payer: Self-pay | Admitting: Nurse Practitioner

## 2024-06-17 ENCOUNTER — Ambulatory Visit: Admitting: Nurse Practitioner

## 2024-06-24 ENCOUNTER — Encounter: Payer: Self-pay | Admitting: Nurse Practitioner

## 2024-06-24 ENCOUNTER — Ambulatory Visit (INDEPENDENT_AMBULATORY_CARE_PROVIDER_SITE_OTHER): Admitting: Nurse Practitioner

## 2024-06-24 VITALS — BP 127/70 | HR 71 | Temp 98.3°F | Resp 16 | Ht 71.0 in | Wt 197.7 lb

## 2024-06-24 DIAGNOSIS — E538 Deficiency of other specified B group vitamins: Secondary | ICD-10-CM

## 2024-06-24 DIAGNOSIS — M25562 Pain in left knee: Secondary | ICD-10-CM

## 2024-06-24 DIAGNOSIS — F5101 Primary insomnia: Secondary | ICD-10-CM | POA: Diagnosis not present

## 2024-06-24 DIAGNOSIS — K219 Gastro-esophageal reflux disease without esophagitis: Secondary | ICD-10-CM | POA: Diagnosis not present

## 2024-06-24 DIAGNOSIS — E782 Mixed hyperlipidemia: Secondary | ICD-10-CM

## 2024-06-24 DIAGNOSIS — I10 Essential (primary) hypertension: Secondary | ICD-10-CM

## 2024-06-24 DIAGNOSIS — G8929 Other chronic pain: Secondary | ICD-10-CM

## 2024-06-24 DIAGNOSIS — Z125 Encounter for screening for malignant neoplasm of prostate: Secondary | ICD-10-CM

## 2024-06-24 DIAGNOSIS — R7303 Prediabetes: Secondary | ICD-10-CM

## 2024-06-24 DIAGNOSIS — E559 Vitamin D deficiency, unspecified: Secondary | ICD-10-CM

## 2024-06-24 DIAGNOSIS — M25512 Pain in left shoulder: Secondary | ICD-10-CM | POA: Diagnosis not present

## 2024-06-24 MED ORDER — ROSUVASTATIN CALCIUM 5 MG PO TABS: 5.0000 mg | ORAL_TABLET | Freq: Every day | ORAL | 3 refills | Status: AC

## 2024-06-24 MED ORDER — PANTOPRAZOLE SODIUM 40 MG PO TBEC: 40.0000 mg | DELAYED_RELEASE_TABLET | Freq: Two times a day (BID) | ORAL | 1 refills | Status: AC

## 2024-06-24 MED ORDER — TRAMADOL HCL 50 MG PO TABS
50.0000 mg | ORAL_TABLET | Freq: Three times a day (TID) | ORAL | 3 refills | Status: DC | PRN
Start: 2024-06-24 — End: 2024-10-11

## 2024-06-24 MED ORDER — ESZOPICLONE 3 MG PO TABS: 3.0000 mg | ORAL_TABLET | Freq: Every day | ORAL | 3 refills | Status: DC

## 2024-06-24 NOTE — Progress Notes (Signed)
 [facility] [address] [place], [place] [address]  Internal MEDICINE  Office Visit Note  Patient Name: Terry Harvey  [id number]  [id number]  Date of Service: 06/24/2024  Chief Complaint  Patient presents with   Hyperlipidemia   Hypertension   Follow-up    HPI Fain presents for a follow-up visit for hypertension, chronic knee pain, insomnia, chronic shoulder pain.  Hypertension -- BP controlled with current medications: amlodipine , bisoprolol  and hydrochlorothiazide .  Insomnia -- tried lunesta   instead of zaleplon . Chronic knee pain -- takes tramadol  as needed, due for refills.  Abdominal bloating and constipation -- amitiza  and dicyclomine  was prescribed at his last office visit. Doing better, taking stool softener    Current Medication: Outpatient Encounter Medications as of 06/24/2024  Medication Sig   acetaminophen  (TYLENOL ) 500 MG tablet Take 500 mg by mouth every 6 (six) hours as needed.   amLODipine  (NORVASC ) 5 MG tablet Take 1 tablet (5 mg total) by mouth daily.   bisoprolol  (ZEBETA ) 5 MG tablet Take 1 tablet (5 mg total) by mouth daily.   calcium  carbonate (TUMS EX) 750 MG chewable tablet Chew 1 tablet by mouth daily.   Cholecalciferol (D3 5000 PO) Take by mouth.   Eszopiclone  3 MG TABS Take 1 tablet (3 mg total) by mouth at bedtime. Take immediately before bedtime   gabapentin  (NEURONTIN ) 300 MG capsule TAKE 1 CAPSULE BY MOUTH AT BEDTIME FOR SPASM   hydrochlorothiazide  (HYDRODIURIL ) 12.5 MG tablet Take 1 tablet (12.5 mg total) by mouth daily.   Multiple Vitamin (ONE-A-DAY MENS PO) Take 1 tablet by mouth daily.    OVER THE COUNTER MEDICATION Golden Revive   OVER THE COUNTER MEDICATION CBD gummies   OVER THE COUNTER MEDICATION Delta 8/9   pantoprazole  (PROTONIX ) 40 MG tablet Take 1 tablet (40 mg total) by mouth 2 (two) times daily.   rosuvastatin  (CRESTOR ) 5 MG tablet Take 1 tablet (5 mg total) by mouth daily.   traMADol  (ULTRAM ) 50 MG tablet Take  1 tablet (50 mg total) by mouth 3 (three) times daily as needed for moderate pain (pain score 4-6) or severe pain (pain score 7-10).   [DISCONTINUED] dicyclomine  (BENTYL ) 20 MG tablet Take 1 tablet (20 mg total) by mouth 4 (four) times daily -  before meals and at bedtime.   [DISCONTINUED] Eszopiclone  3 MG TABS Take 1 tablet (3 mg total) by mouth at bedtime. Take immediately before bedtime   [DISCONTINUED] lubiprostone  (AMITIZA ) 24 MCG capsule Take 1 capsule (24 mcg total) by mouth 2 (two) times daily with a meal.   [DISCONTINUED] pantoprazole  (PROTONIX ) 40 MG tablet TAKE 1 TABLET BY MOUTH TWICE A DAY   [DISCONTINUED] rosuvastatin  (CRESTOR ) 5 MG tablet TAKE 1 TABLET (5 MG TOTAL) BY MOUTH DAILY.   [DISCONTINUED] traMADol  (ULTRAM ) 50 MG tablet Take 1 tablet (50 mg total) by mouth 3 (three) times daily as needed for moderate pain (pain score 4-6) or severe pain (pain score 7-10).   [DISCONTINUED] zaleplon  (SONATA ) 10 MG capsule Take 1 capsule (10 mg total) by mouth at bedtime as needed for sleep.   No facility-administered encounter medications on file as of 06/24/2024.    Surgical History: Past Surgical History:  Procedure Laterality Date   APPENDECTOMY  2009   INGUINAL HERNIA REPAIR Right 11/12/2018   Procedure: HERNIA REPAIR INGUINAL ADULT;  Surgeon: Desiderio Schanz, MD;  Location: [facility] ORS;  Service: General;  Laterality: Right;   KNEE ARTHROPLASTY Left 01/15/2023   Procedure: COMPUTER ASSISTED TOTAL KNEE ARTHROPLASTY;  Surgeon: Mardee Lynwood SQUIBB, MD;  Location: [facility] ORS;  Service: Orthopedics;  Laterality: Left;   TUMOR REMOVAL  2002   from right side of neck    Medical History: Past Medical History:  Diagnosis Date   Hyperlipidemia    Hypertension    Insomnia    Osteoarthritis     Family History: Family History  Problem Relation Age of Onset   Diabetes Mellitus II Mother    Hypertension Father     Social History   Socioeconomic History   Marital status: Widowed    Spouse name:  Not on file   Number of children: 3   Years of education: Not on file   Highest education level: Not on file  Occupational History   Not on file  Tobacco Use   Smoking status: Former    Current packs/day: 0.00    Types: Cigarettes    Quit date: 11/19/2011    Years since quitting: 12.6   Smokeless tobacco: Never  Vaping Use   Vaping status: Never Used  Substance and Sexual Activity   Alcohol use: No    Alcohol/week: 0.0 standard drinks of alcohol   Drug use: No    Comment: cbd gummies   Sexual activity: Not on file  Other Topics Concern   Not on file  Social History Narrative   Not on file   Social Drivers of Health   Financial Resource Strain: Low Risk  (09/28/2021)   Overall Financial Resource Strain (CARDIA)    Difficulty of Paying Living Expenses: Not very hard  Food Insecurity: No Food Insecurity (01/15/2023)   Hunger Vital Sign    Worried About Running Out of Food in the Last Year: Never true    Ran Out of Food in the Last Year: Never true  Transportation Needs: No Transportation Needs (01/15/2023)   PRAPARE - Administrator, Civil Service (Medical): No    Lack of Transportation (Non-Medical): No  Physical Activity: Not on file  Stress: Not on file  Social Connections: Not on file  Intimate Partner Violence: Not At Risk (01/15/2023)   Humiliation, Afraid, Rape, and Kick questionnaire    Fear of Current or Ex-Partner: No    Emotionally Abused: No    Physically Abused: No    Sexually Abused: No      Review of Systems  Constitutional:  Negative for chills, fatigue and unexpected weight change.  HENT:  Negative for congestion, rhinorrhea, sneezing and sore throat.   Eyes:  Negative for redness.  Respiratory:  Negative for cough, chest tightness and shortness of breath.   Cardiovascular:  Negative for chest pain and palpitations.  Gastrointestinal:  Positive for abdominal distention, abdominal pain, constipation and nausea. Negative for diarrhea and  vomiting.  Genitourinary:  Negative for dysuria and frequency.  Musculoskeletal:  Positive for arthralgias. Negative for back pain, joint swelling and neck pain.  Skin:  Negative for rash.  Neurological: Negative.  Negative for tremors and numbness.  Hematological:  Negative for adenopathy. Does not bruise/bleed easily.  Psychiatric/Behavioral:  Positive for sleep disturbance. Negative for behavioral problems (Depression), self-injury and suicidal ideas. The patient is not nervous/anxious.     Vital Signs: BP 127/70   Pulse 71   Temp 98.3 F (36.8 C)   Resp 16   Ht 5' 11 (1.803 m)   Wt 197 lb 11.2 oz (89.7 kg)   SpO2 97%   BMI 27.57 kg/m    Physical Exam Vitals reviewed.  Constitutional:  General: He is not in acute distress.    Appearance: Normal appearance. He is not ill-appearing.  HENT:     Head: Normocephalic and atraumatic.  Eyes:     Pupils: Pupils are equal, round, and reactive to light.  Cardiovascular:     Rate and Rhythm: Normal rate and regular rhythm.     Heart sounds: Normal heart sounds. No murmur heard. Pulmonary:     Effort: Pulmonary effort is normal. No respiratory distress.     Breath sounds: Normal breath sounds. No wheezing.  Abdominal:     General: Bowel sounds are normal. There is distension.     Palpations: Abdomen is soft.  Skin:    General: Skin is warm and dry.  Neurological:     Mental Status: He is alert and oriented to person, place, and time.     Cranial Nerves: No cranial nerve deficit.     Coordination: Coordination normal.     Gait: Gait normal.  Psychiatric:        Mood and Affect: Mood normal.        Behavior: Behavior normal.        Assessment/Plan: 1. Primary hypertension (Primary) Routine labs ordered, BP is controlled and stable, continue medications as prescribed.  - CBC with Differential/Platelet - CMP14+EGFR - Lipid Profile  2. Mixed hyperlipidemia Continue rosuvastatin  as prescribed. Routine labs  ordered.  - CBC with Differential/Platelet - CMP14+EGFR - Lipid Profile - Hgb A1C w/o eAG - rosuvastatin  (CRESTOR ) 5 MG tablet; Take 1 tablet (5 mg total) by mouth daily.  Dispense: 90 tablet; Refill: 3  3. Prediabetes Routine labs ordered  - CBC with Differential/Platelet - CMP14+EGFR - Lipid Profile - Hgb A1C w/o eAG  4. Gastroesophageal reflux disease without esophagitis Continue pantoprazole  as prescribed.  - pantoprazole  (PROTONIX ) 40 MG tablet; Take 1 tablet (40 mg total) by mouth 2 (two) times daily.  Dispense: 180 tablet; Refill: 1  5. Chronic pain of left knee Continue tramadol  as prescribed, follow up in late november - traMADol  (ULTRAM ) 50 MG tablet; Take 1 tablet (50 mg total) by mouth 3 (three) times daily as needed for moderate pain (pain score 4-6) or severe pain (pain score 7-10).  Dispense: 90 tablet; Refill: 3  6. Chronic left shoulder pain Continue tramadol  as prescribed, follow up in late november - traMADol  (ULTRAM ) 50 MG tablet; Take 1 tablet (50 mg total) by mouth 3 (three) times daily as needed for moderate pain (pain score 4-6) or severe pain (pain score 7-10).  Dispense: 90 tablet; Refill: 3  7. B12 deficiency Routine labs ordered - CBC with Differential/Platelet - CMP14+EGFR - Lipid Profile - B12 and Folate Panel - Iron, TIBC and Ferritin Panel  8. Vitamin D deficiency Routine lab ordered  - Vitamin D (25 hydroxy)  9. Screening for prostate cancer Routine lab ordered  - PSA Total (Reflex To Free)  10. Primary insomnia Continue lunesta  as prescribed, follow up in late November  - Eszopiclone  3 MG TABS; Take 1 tablet (3 mg total) by mouth at bedtime. Take immediately before bedtime  Dispense: 30 tablet; Refill: 3   General Counseling: Trevonne verbalizes understanding of the findings of todays visit and agrees with plan of treatment. I have discussed any further diagnostic evaluation that may be needed or ordered today. We also reviewed his  medications today. he has been encouraged to call the office with any questions or concerns that should arise related to todays visit.    Orders Placed This Encounter  Procedures   CBC with Differential/Platelet   CMP14+EGFR   Lipid Profile   B12 and Folate Panel   Vitamin D (25 hydroxy)   Iron, TIBC and Ferritin Panel   Hgb A1C w/o eAG   PSA Total (Reflex To Free)    Meds ordered this encounter  Medications   traMADol  (ULTRAM ) 50 MG tablet    Sig: Take 1 tablet (50 mg total) by mouth 3 (three) times daily as needed for moderate pain (pain score 4-6) or severe pain (pain score 7-10).    Dispense:  90 tablet    Refill:  3    For future refills   pantoprazole  (PROTONIX ) 40 MG tablet    Sig: Take 1 tablet (40 mg total) by mouth 2 (two) times daily.    Dispense:  180 tablet    Refill:  1   rosuvastatin  (CRESTOR ) 5 MG tablet    Sig: Take 1 tablet (5 mg total) by mouth daily.    Dispense:  90 tablet    Refill:  3   Eszopiclone  3 MG TABS    Sig: Take 1 tablet (3 mg total) by mouth at bedtime. Take immediately before bedtime    Dispense:  30 tablet    Refill:  3    For future refills    Return for previously scheduled, AWV, Chantavia Bazzle PCP in late november, may need 1 more lunesta /tramadol  fill.   Total time spent:30 Minutes Time spent includes review of chart, medications, test results, and follow up plan with the patient.   [place] Controlled Substance Database was reviewed by me.  This patient was seen by Mardy Maxin, FNP-C in collaboration with Dr. Sigrid Bathe as a part of collaborative care agreement.   Aradhya Shellenbarger R. Maxin, MSN, FNP-C Internal medicine

## 2024-07-03 ENCOUNTER — Other Ambulatory Visit: Payer: Self-pay | Admitting: Nurse Practitioner

## 2024-07-03 DIAGNOSIS — I10 Essential (primary) hypertension: Secondary | ICD-10-CM

## 2024-07-03 DIAGNOSIS — E538 Deficiency of other specified B group vitamins: Secondary | ICD-10-CM

## 2024-07-03 DIAGNOSIS — G8929 Other chronic pain: Secondary | ICD-10-CM

## 2024-07-03 DIAGNOSIS — Z125 Encounter for screening for malignant neoplasm of prostate: Secondary | ICD-10-CM

## 2024-07-03 DIAGNOSIS — E782 Mixed hyperlipidemia: Secondary | ICD-10-CM

## 2024-07-03 DIAGNOSIS — E559 Vitamin D deficiency, unspecified: Secondary | ICD-10-CM

## 2024-07-03 DIAGNOSIS — R7303 Prediabetes: Secondary | ICD-10-CM

## 2024-07-25 ENCOUNTER — Encounter: Payer: Self-pay | Admitting: Nurse Practitioner

## 2024-07-25 DIAGNOSIS — E559 Vitamin D deficiency, unspecified: Secondary | ICD-10-CM | POA: Insufficient documentation

## 2024-07-25 DIAGNOSIS — R7303 Prediabetes: Secondary | ICD-10-CM | POA: Insufficient documentation

## 2024-09-02 ENCOUNTER — Other Ambulatory Visit: Payer: Self-pay | Admitting: Nurse Practitioner

## 2024-09-02 DIAGNOSIS — I10 Essential (primary) hypertension: Secondary | ICD-10-CM

## 2024-09-28 LAB — B12 AND FOLATE PANEL
Folate: 17.3 ng/mL (ref >3.0)
Vitamin B-12: 431 pg/mL (ref 232–1245)

## 2024-09-28 LAB — LIPID PANEL
Chol/HDL Ratio: 3.1 ratio (ref 0.0–5.0)
Cholesterol, Total: 129 mg/dL (ref 100–199)
HDL: 42 mg/dL (ref >39)
LDL Chol Calc (NIH): 57 mg/dL (ref 0–99)
Triglycerides: 184 mg/dL — ABNORMAL HIGH (ref 0–149)
VLDL Cholesterol Cal: 30 mg/dL (ref 5–40)

## 2024-09-28 LAB — CMP14+EGFR
ALT: 26 [IU]/L (ref 0–44)
AST: 24 [IU]/L (ref 0–40)
Albumin: 4.3 g/dL (ref 3.8–4.8)
Alkaline Phosphatase: 63 [IU]/L (ref 47–123)
BUN/Creatinine Ratio: 15 (ref 10–24)
BUN: 18 mg/dL (ref 8–27)
Bilirubin Total: 0.5 mg/dL (ref 0.0–1.2)
CO2: 25 mmol/L (ref 20–29)
Calcium: 9.5 mg/dL (ref 8.6–10.2)
Chloride: 101 mmol/L (ref 96–106)
Creatinine, Ser: 1.21 mg/dL (ref 0.76–1.27)
Globulin, Total: 2.7 g/dL (ref 1.5–4.5)
Glucose: 109 mg/dL — ABNORMAL HIGH (ref 70–99)
Potassium: 4.5 mmol/L (ref 3.5–5.2)
Sodium: 139 mmol/L (ref 134–144)
Total Protein: 7 g/dL (ref 6.0–8.5)
eGFR: 64 mL/min/{1.73_m2}

## 2024-09-28 LAB — CBC WITH DIFFERENTIAL/PLATELET
Basophils Absolute: 0.1 10*3/uL (ref 0.0–0.2)
Basos: 1 %
EOS (ABSOLUTE): 0.2 10*3/uL (ref 0.0–0.4)
Eos: 2 %
Hematocrit: 43.8 % (ref 37.5–51.0)
Hemoglobin: 14.5 g/dL (ref 13.0–17.7)
Immature Grans (Abs): 0 10*3/uL (ref 0.0–0.1)
Immature Granulocytes: 0 %
Lymphocytes Absolute: 3.2 10*3/uL — ABNORMAL HIGH (ref 0.7–3.1)
Lymphs: 39 %
MCH: 30.1 pg (ref 26.6–33.0)
MCHC: 33.1 g/dL (ref 31.5–35.7)
MCV: 91 fL (ref 79–97)
Monocytes Absolute: 0.8 10*3/uL (ref 0.1–0.9)
Monocytes: 9 %
Neutrophils Absolute: 4 10*3/uL (ref 1.4–7.0)
Neutrophils: 49 %
Platelets: 246 10*3/uL (ref 150–450)
RBC: 4.81 x10E6/uL (ref 4.14–5.80)
RDW: 12.4 % (ref 11.6–15.4)
WBC: 8.3 10*3/uL (ref 3.4–10.8)

## 2024-09-28 LAB — IRON,TIBC AND FERRITIN PANEL
Ferritin: 29 ng/mL — ABNORMAL LOW (ref 30–400)
Iron Saturation: 25 % (ref 15–55)
Iron: 100 ug/dL (ref 38–169)
Total Iron Binding Capacity: 398 ug/dL (ref 250–450)
UIBC: 298 ug/dL (ref 111–343)

## 2024-09-28 LAB — VITAMIN D 25 HYDROXY (VIT D DEFICIENCY, FRACTURES): Vit D, 25-Hydroxy: 56.9 ng/mL (ref 30.0–100.0)

## 2024-09-28 LAB — HGB A1C W/O EAG: Hgb A1c MFr Bld: 5.8 % — ABNORMAL HIGH (ref 4.8–5.6)

## 2024-09-28 LAB — PSA TOTAL (REFLEX TO FREE): Prostate Specific Ag, Serum: 0.9 ng/mL (ref 0.0–4.0)

## 2024-09-30 ENCOUNTER — Ambulatory Visit: Payer: Self-pay | Admitting: Internal Medicine

## 2024-10-11 ENCOUNTER — Encounter: Payer: Self-pay | Admitting: Nurse Practitioner

## 2024-10-11 ENCOUNTER — Ambulatory Visit: Payer: Medicare HMO | Admitting: Nurse Practitioner

## 2024-10-11 VITALS — BP 130/86 | HR 98 | Temp 98.3°F | Resp 16 | Ht 71.0 in | Wt 205.8 lb

## 2024-10-11 DIAGNOSIS — F5101 Primary insomnia: Secondary | ICD-10-CM | POA: Diagnosis not present

## 2024-10-11 DIAGNOSIS — I700 Atherosclerosis of aorta: Secondary | ICD-10-CM | POA: Diagnosis not present

## 2024-10-11 DIAGNOSIS — R7303 Prediabetes: Secondary | ICD-10-CM

## 2024-10-11 DIAGNOSIS — M25512 Pain in left shoulder: Secondary | ICD-10-CM | POA: Diagnosis not present

## 2024-10-11 DIAGNOSIS — E782 Mixed hyperlipidemia: Secondary | ICD-10-CM

## 2024-10-11 DIAGNOSIS — R300 Dysuria: Secondary | ICD-10-CM

## 2024-10-11 DIAGNOSIS — Z0001 Encounter for general adult medical examination with abnormal findings: Secondary | ICD-10-CM | POA: Diagnosis not present

## 2024-10-11 DIAGNOSIS — G8929 Other chronic pain: Secondary | ICD-10-CM | POA: Diagnosis not present

## 2024-10-11 DIAGNOSIS — I10 Essential (primary) hypertension: Secondary | ICD-10-CM | POA: Diagnosis not present

## 2024-10-11 DIAGNOSIS — M170 Bilateral primary osteoarthritis of knee: Secondary | ICD-10-CM

## 2024-10-11 MED ORDER — CYCLOBENZAPRINE HCL 10 MG PO TABS: 10.0000 mg | ORAL_TABLET | Freq: Every evening | ORAL | 5 refills | Status: AC | PRN

## 2024-10-11 MED ORDER — TRAMADOL HCL 50 MG PO TABS: 50.0000 mg | ORAL_TABLET | Freq: Three times a day (TID) | ORAL | 2 refills | Status: AC | PRN

## 2024-10-11 MED ORDER — ESZOPICLONE 3 MG PO TABS: 3.0000 mg | ORAL_TABLET | Freq: Every day | ORAL | 2 refills | Status: AC

## 2024-10-11 NOTE — Progress Notes (Signed)
 [facility] [address] [place], [place] [address]  Internal MEDICINE  Office Visit Note  Patient Name: Terry Harvey  [id number]  [id number]  Date of Service: 10/11/2024  Chief Complaint  Patient presents with   Hyperlipidemia   Hypertension   Medicare Wellness    HPI Jackston presents for a medicare annual wellness visit.  Well-appearing 73 y.o. male with hypertension, osteoarthritis and high cholesterol.  Routine CRC screening: cologuard was done last year and is due in 2027 Labs: lab results reviewed with the patient.  Low ferritin 29, normal serum iron A1c is unchanged at 5.8, prediabetic Triglycerides 184, improved. The rest of the cholesterol panel is normal.  Slightly decrease kidney function -- creatinine 1.21, eGFR 64 PSA normal.  New or worsening pain: chronic knee pain Other concerns: pain in groin where mesh is from hernia repair o nthe right side, reports frequent charlie horse/buring pain of the anterior right thigh and chronic right knee pain.  Mother passed away on 2024-09-29 at 53 years old.       10/11/2024    1:50 PM 10/07/2023    2:04 PM 10/01/2022    1:52 PM  MMSE - Mini Mental State Exam  Orientation to time 5 5 5   Orientation to Place 5 5 5   Registration 3 3 3   Attention/ Calculation 5 5 5   Recall 3 3 3   Language- name 2 objects 2 2 2   Language- repeat 1 1 1   Language- follow 3 step command 3 3 3   Language- read & follow direction 1 1 1   Write a sentence 1 1 1   Copy design 1 1 1   Total score 30 30 30     Functional Status Survey: Is the patient deaf or have difficulty hearing?: No Does the patient have difficulty seeing, even when wearing glasses/contacts?: No Does the patient have difficulty concentrating, remembering, or making decisions?: No Does the patient have difficulty walking or climbing stairs?: No Does the patient have difficulty dressing or bathing?: No Does the patient have difficulty doing errands alone  such as visiting a doctor's office or shopping?: No     10/01/2022    1:50 PM 12/30/2022    2:55 PM 06/16/2023    2:44 PM 10/07/2023    2:03 PM 10/11/2024    1:49 PM  Fall Risk  Falls in the past year? 0 1 0 0 0  Was there an injury with Fall? 0  0   0  0   Fall Risk Category Calculator 0 1  0 0  Fall Risk Category (Retired) Low       (RETIRED) Patient Fall Risk Level Low fall risk       Patient at Risk for Falls Due to No Fall Risks No Fall Risks  No Fall Risks   Fall risk Follow up Falls evaluation completed  Falls evaluation completed Falls evaluation completed Falls evaluation completed Falls evaluation completed     Data saved with a previous flowsheet row definition       10/11/2024    1:49 PM  Depression screen PHQ 2/9  Decreased Interest 0  Down, Depressed, Hopeless 0  PHQ - 2 Score 0        Current Medication: Outpatient Encounter Medications as of 10/11/2024  Medication Sig   cyclobenzaprine  (FLEXERIL ) 10 MG tablet Take 1 tablet (10 mg total) by mouth at bedtime as needed for muscle spasms.   Melatonin 10 MG TABS Take by mouth.   acetaminophen  (TYLENOL )  500 MG tablet Take 500 mg by mouth every 6 (six) hours as needed.   amLODipine  (NORVASC ) 5 MG tablet Take 1 tablet (5 mg total) by mouth daily.   bisoprolol  (ZEBETA ) 5 MG tablet Take 1 tablet (5 mg total) by mouth daily.   calcium  carbonate (TUMS EX) 750 MG chewable tablet Chew 1 tablet by mouth daily.   Cholecalciferol (D3 5000 PO) Take by mouth.   Eszopiclone  3 MG TABS Take 1 tablet (3 mg total) by mouth at bedtime. Take immediately before bedtime   hydrochlorothiazide  (HYDRODIURIL ) 12.5 MG tablet TAKE 1 TABLET BY MOUTH EVERY DAY   Multiple Vitamin (ONE-A-DAY MENS PO) Take 1 tablet by mouth daily.    pantoprazole  (PROTONIX ) 40 MG tablet Take 1 tablet (40 mg total) by mouth 2 (two) times daily.   rosuvastatin  (CRESTOR ) 5 MG tablet Take 1 tablet (5 mg total) by mouth daily.   traMADol  (ULTRAM ) 50 MG tablet Take  1 tablet (50 mg total) by mouth 3 (three) times daily as needed for moderate pain (pain score 4-6) or severe pain (pain score 7-10).   [DISCONTINUED] Eszopiclone  3 MG TABS Take 1 tablet (3 mg total) by mouth at bedtime. Take immediately before bedtime   [DISCONTINUED] OVER THE COUNTER MEDICATION Richelle Holy (Patient not taking: Reported on 10/11/2024)   [DISCONTINUED] OVER THE COUNTER MEDICATION CBD gummies (Patient not taking: Reported on 10/11/2024)   [DISCONTINUED] OVER THE COUNTER MEDICATION Delta 8/9 (Patient not taking: Reported on 10/11/2024)   [DISCONTINUED] traMADol  (ULTRAM ) 50 MG tablet Take 1 tablet (50 mg total) by mouth 3 (three) times daily as needed for moderate pain (pain score 4-6) or severe pain (pain score 7-10).   No facility-administered encounter medications on file as of 10/11/2024.    Surgical History: Past Surgical History:  Procedure Laterality Date   APPENDECTOMY  2009   INGUINAL HERNIA REPAIR Right 11/12/2018   Procedure: HERNIA REPAIR INGUINAL ADULT;  Surgeon: Desiderio Schanz, MD;  Location: [facility] ORS;  Service: General;  Laterality: Right;   KNEE ARTHROPLASTY Left 01/15/2023   Procedure: COMPUTER ASSISTED TOTAL KNEE ARTHROPLASTY;  Surgeon: Mardee Lynwood SQUIBB, MD;  Location: [facility] ORS;  Service: Orthopedics;  Laterality: Left;   TUMOR REMOVAL  2002   from right side of neck    Medical History: Past Medical History:  Diagnosis Date   Hyperlipidemia    Hypertension    Insomnia    Osteoarthritis     Family History: Family History  Problem Relation Age of Onset   Diabetes Mellitus II Mother    Hypertension Father     Social History   Socioeconomic History   Marital status: Widowed    Spouse name: Not on file   Number of children: 3   Years of education: Not on file   Highest education level: Not on file  Occupational History   Not on file  Tobacco Use   Smoking status: Former    Current packs/day: 0.00    Types: Cigarettes    Quit date: 11/19/2011     Years since quitting: 13.0   Smokeless tobacco: Never  Vaping Use   Vaping status: Never Used  Substance and Sexual Activity   Alcohol use: No    Alcohol/week: 0.0 standard drinks of alcohol   Drug use: No    Comment: cbd gummies   Sexual activity: Not on file  Other Topics Concern   Not on file  Social History Narrative   Not on file   Social Drivers of Health  Tobacco Use: Medium Risk (10/27/2024)   Received from [facility]   Patient History    Smoking Tobacco Use: Former    Smokeless Tobacco Use: Never    Passive Exposure: Not on file  Financial Resource Strain: Low Risk  (10/27/2024)   Received from [facility]   Overall Financial Resource Strain (CARDIA)    Difficulty of Paying Living Expenses: Not very hard  Food Insecurity: No Food Insecurity (10/27/2024)   Received from [facility]   Epic    Within the past 12 months, you worried that your food would run out before you got the money to buy more.: Never true    Within the past 12 months, the food you bought just didn't last and you didn't have money to get more.: Never true  Transportation Needs: No Transportation Needs (10/27/2024)   Received from [facility] - Transportation    In the past 12 months, has lack of transportation kept you from medical appointments or from getting medications?: No    Lack of Transportation (Non-Medical): No  Physical Activity: Not on file  Stress: Not on file  Social Connections: Not on file  Intimate Partner Violence: Not At Risk (01/15/2023)   Humiliation, Afraid, Rape, and Kick questionnaire    Fear of Current or Ex-Partner: No    Emotionally Abused: No    Physically Abused: No    Sexually Abused: No  Depression (PHQ2-9): Low Risk (10/11/2024)   Depression (PHQ2-9)    PHQ-2 Score: 0  Alcohol Screen: Low Risk (12/28/2021)   Alcohol Screen    Last Alcohol Screening Score (AUDIT): 0  Housing:  Low Risk  (10/27/2024)   Received from [facility]   Epic    In the last 12 months, was there a time when you were not able to pay the mortgage or rent on time?: No    In the past 12 months, how many times have you moved where you were living?: 0    At any time in the past 12 months, were you homeless or living in a shelter (including now)?: No  Utilities: Not At Risk (10/27/2024)   Received from [facility]   Epic    In the past 12 months has the electric, gas, oil, or water company threatened to shut off services in your home?: No  Health Literacy: Not on file      Review of Systems  Constitutional:  Positive for fatigue. Negative for activity change, appetite change, chills, fever and unexpected weight change.  HENT: Negative.  Negative for congestion, ear pain, rhinorrhea, sore throat and trouble swallowing.   Eyes: Negative.   Respiratory: Negative.  Negative for cough, chest tightness, shortness of breath and wheezing.   Cardiovascular: Negative.  Negative for chest pain and palpitations.  Gastrointestinal: Negative.  Negative for abdominal pain, blood in stool, constipation, diarrhea, nausea and vomiting.  Endocrine: Negative for polydipsia and polyuria.  Genitourinary:  Negative for difficulty urinating, dysuria, frequency, hematuria and urgency.  Musculoskeletal:  Positive for arthralgias (left shoulder and left knee) and myalgias (left arm). Negative for back pain, joint swelling and neck pain.  Skin: Negative.  Negative for rash and wound.  Allergic/Immunologic: Negative.  Negative for immunocompromised state.  Neurological:  Positive for weakness and numbness (and tingling in fingers). Negative for dizziness, seizures and headaches.  Hematological: Negative.   Psychiatric/Behavioral: Negative.  Negative for behavioral problems, self-injury, sleep disturbance  and suicidal ideas. The patient is not nervous/anxious.     Vital Signs: BP  130/86   Pulse 98   Temp 98.3 F (36.8 C)   Resp 16   Ht 5' 11 (1.803 m)   Wt 205 lb 12.8 oz (93.4 kg)   SpO2 93%   BMI 28.70 kg/m    Physical Exam Vitals reviewed.  Constitutional:      General: He is awake. He is not in acute distress.    Appearance: Normal appearance. He is well-developed, well-groomed and normal weight. He is not ill-appearing or diaphoretic.  HENT:     Head: Normocephalic and atraumatic.     Right Ear: Tympanic membrane, ear canal and external ear normal.     Left Ear: Tympanic membrane, ear canal and external ear normal.     Nose: Nose normal. No congestion or rhinorrhea.     Mouth/Throat:     Lips: Pink.     Mouth: Mucous membranes are moist.     Pharynx: Oropharynx is clear. Uvula midline. No oropharyngeal exudate or posterior oropharyngeal erythema.  Eyes:     General: Lids are normal. Vision grossly intact. Gaze aligned appropriately. No scleral icterus.       Right eye: No discharge.        Left eye: No discharge.     Conjunctiva/sclera: Conjunctivae normal.     Pupils: Pupils are equal, round, and reactive to light.     Funduscopic exam:    Right eye: Red reflex present.        Left eye: Red reflex present. Neck:     Thyroid : No thyromegaly.     Vascular: No JVD.     Trachea: No tracheal deviation.  Cardiovascular:     Rate and Rhythm: Normal rate and regular rhythm.     Pulses: Normal pulses.     Heart sounds: Normal heart sounds, S1 normal and S2 normal. No murmur heard.    No friction rub. No gallop.  Pulmonary:     Effort: Pulmonary effort is normal. No accessory muscle usage or respiratory distress.     Breath sounds: Normal breath sounds and air entry. No stridor. No wheezing or rales.  Chest:     Chest wall: No tenderness.  Abdominal:     General: Bowel sounds are normal. There is no distension.     Palpations: Abdomen is soft. There is no shifting dullness, fluid wave, mass or pulsatile mass.     Tenderness: There is no  abdominal tenderness. There is no guarding or rebound.  Musculoskeletal:        General: No deformity.     Right shoulder: Normal.     Left shoulder: Tenderness present. Decreased range of motion. Decreased strength.     Cervical back: Normal range of motion and neck supple.     Comments: Radiation from left shoulder down the left arm  Skin:    General: Skin is warm and dry.     Capillary Refill: Capillary refill takes less than 2 seconds.     Coloration: Skin is not pale.     Findings: No erythema or rash.  Neurological:     Mental Status: He is alert and oriented to person, place, and time.     Cranial Nerves: No cranial nerve deficit.     Motor: No abnormal muscle tone.     Coordination: Coordination normal.     Gait: Gait normal.     Deep Tendon Reflexes: Reflexes are normal  and symmetric.  Psychiatric:        Mood and Affect: Mood and affect normal.        Behavior: Behavior normal. Behavior is cooperative.        Thought Content: Thought content normal.        Judgment: Judgment normal.        Assessment/Plan: 1. Encounter for Medicare annual examination with abnormal findings (Primary) Age-appropriate preventive screenings and vaccinations discussed. Routine labs for health maintenance results discussed with the patient today. PHM updated.    2. Primary hypertension Stable, continue medications as prescribed.   3. Aortic atherosclerosis Continue rosuvastatin  as prescribed.   4. Prediabetes Continue low carb low sugar diet and increase physical activity as tolerated.   5. Chronic left shoulder pain Continue prn tramadol  and cyclobenzaprine  as prescribed. Follow up in 3 months for additional refills of tramadol  - traMADol  (ULTRAM ) 50 MG tablet; Take 1 tablet (50 mg total) by mouth 3 (three) times daily as needed for moderate pain (pain score 4-6) or severe pain (pain score 7-10).  Dispense: 90 tablet; Refill: 2 - cyclobenzaprine  (FLEXERIL ) 10 MG tablet; Take 1  tablet (10 mg total) by mouth at bedtime as needed for muscle spasms.  Dispense: 30 tablet; Refill: 5  6. Primary osteoarthritis of both knees Continue prn tramadol  and cyclobenzaprine  as prescribed. Follow up in 3 months for additional refills of tramadol  - traMADol  (ULTRAM ) 50 MG tablet; Take 1 tablet (50 mg total) by mouth 3 (three) times daily as needed for moderate pain (pain score 4-6) or severe pain (pain score 7-10).  Dispense: 90 tablet; Refill: 2 - cyclobenzaprine  (FLEXERIL ) 10 MG tablet; Take 1 tablet (10 mg total) by mouth at bedtime as needed for muscle spasms.  Dispense: 30 tablet; Refill: 5  7. Dysuria Urien sent to lab  - UA/M w/rflx Culture, Routine - Microscopic Examination  8. Primary insomnia Continue lunesta  as prescribed. Follow up in 3 months for additional refills.  - Melatonin 10 MG TABS; Take by mouth. - Eszopiclone  3 MG TABS; Take 1 tablet (3 mg total) by mouth at bedtime. Take immediately before bedtime  Dispense: 30 tablet; Refill: 2     General Counseling: Dayvian verbalizes understanding of the findings of todays visit and agrees with plan of treatment. I have discussed any further diagnostic evaluation that may be needed or ordered today. We also reviewed his medications today. he has been encouraged to call the office with any questions or concerns that should arise related to todays visit.    Orders Placed This Encounter  Procedures   Microscopic Examination   UA/M w/rflx Culture, Routine    Meds ordered this encounter  Medications   traMADol  (ULTRAM ) 50 MG tablet    Sig: Take 1 tablet (50 mg total) by mouth 3 (three) times daily as needed for moderate pain (pain score 4-6) or severe pain (pain score 7-10).    Dispense:  90 tablet    Refill:  2    For future refills   Eszopiclone  3 MG TABS    Sig: Take 1 tablet (3 mg total) by mouth at bedtime. Take immediately before bedtime    Dispense:  30 tablet    Refill:  2    For future refills    cyclobenzaprine  (FLEXERIL ) 10 MG tablet    Sig: Take 1 tablet (10 mg total) by mouth at bedtime as needed for muscle spasms.    Dispense:  30 tablet    Refill:  5  Fill new script today    Return in about 3 months (around 01/05/2025) for F/U, pain med refill, Johneric Mcfadden PCP tramadol  and lunesta  refills. .   Total time spent:30 Minutes Time spent includes review of chart, medications, test results, and follow up plan with the patient.   [place] Controlled Substance Database was reviewed by me.  This patient was seen by Mardy Maxin, FNP-C in collaboration with Dr. Sigrid Bathe as a part of collaborative care agreement.  Cleaster Shiffer R. Maxin, MSN, FNP-C Internal medicine

## 2024-10-12 ENCOUNTER — Ambulatory Visit: Payer: Self-pay | Admitting: Internal Medicine

## 2024-10-12 LAB — MICROSCOPIC EXAMINATION
Bacteria, UA: NONE SEEN
Casts: NONE SEEN /LPF
Epithelial Cells (non renal): NONE SEEN /HPF (ref 0–10)
WBC, UA: NONE SEEN /HPF (ref 0–5)

## 2024-10-12 LAB — UA/M W/RFLX CULTURE, ROUTINE
Bilirubin, UA: NEGATIVE
Glucose, UA: NEGATIVE
Ketones, UA: NEGATIVE
Leukocytes,UA: NEGATIVE
Nitrite, UA: NEGATIVE
Protein,UA: NEGATIVE
Specific Gravity, UA: 1.014 (ref 1.005–1.030)
Urobilinogen, Ur: 0.2 mg/dL (ref 0.2–1.0)
pH, UA: 6.5 (ref 5.0–7.5)

## 2024-10-27 DIAGNOSIS — Z23 Encounter for immunization: Secondary | ICD-10-CM | POA: Diagnosis not present

## 2024-11-16 ENCOUNTER — Encounter: Payer: Self-pay | Admitting: Nurse Practitioner

## 2024-12-03 ENCOUNTER — Encounter: Payer: Self-pay | Admitting: Nurse Practitioner

## 2024-12-03 ENCOUNTER — Telehealth (INDEPENDENT_AMBULATORY_CARE_PROVIDER_SITE_OTHER): Admitting: Nurse Practitioner

## 2024-12-03 VITALS — Resp 16 | Ht 71.0 in | Wt 205.0 lb

## 2024-12-03 DIAGNOSIS — J0100 Acute maxillary sinusitis, unspecified: Secondary | ICD-10-CM

## 2024-12-03 MED ORDER — AZITHROMYCIN 250 MG PO TABS: ORAL_TABLET | ORAL | 0 refills | Status: AC

## 2024-12-03 NOTE — Progress Notes (Signed)
 [facility] [address] [place], [place] [address]  Internal MEDICINE  Telephone Visit  Patient Name: Terry Harvey  [id number]  [id number]  Date of Service: 12/03/2024  I connected with the patient at 0827 by telephone and verified the patients identity using two identifiers.   I discussed the limitations, risks, security and privacy concerns of performing an evaluation and management service by telephone and the availability of in person appointments. I also discussed with the patient that there may be a patient responsible charge related to the service.  The patient expressed understanding and agrees to proceed.    Chief Complaint  Patient presents with   Telephone Screen    Coughing and sinus infection   Telephone Assessment    HPI Terry Harvey presents for a telehealth virtual visit for Onset of symptoms was yesterday Negative for covid He reports sinus drainage, thick drainage, cough, sore throat, fatigue,    Current Medication: Outpatient Encounter Medications as of 12/03/2024  Medication Sig   azithromycin  (ZITHROMAX ) 250 MG tablet Take 2 tablets on day 1, then 1 tablet daily on days 2 through 5   acetaminophen  (TYLENOL ) 500 MG tablet Take 500 mg by mouth every 6 (six) hours as needed.   amLODipine  (NORVASC ) 5 MG tablet Take 1 tablet (5 mg total) by mouth daily.   bisoprolol  (ZEBETA ) 5 MG tablet Take 1 tablet (5 mg total) by mouth daily.   calcium  carbonate (TUMS EX) 750 MG chewable tablet Chew 1 tablet by mouth daily.   Cholecalciferol (D3 5000 PO) Take by mouth.   cyclobenzaprine  (FLEXERIL ) 10 MG tablet Take 1 tablet (10 mg total) by mouth at bedtime as needed for muscle spasms.   Eszopiclone  3 MG TABS Take 1 tablet (3 mg total) by mouth at bedtime. Take immediately before bedtime   hydrochlorothiazide  (HYDRODIURIL ) 12.5 MG tablet TAKE 1 TABLET BY MOUTH EVERY DAY   Melatonin 10 MG TABS Take by mouth.   Multiple Vitamin (ONE-A-DAY MENS PO) Take 1 tablet by  mouth daily.    pantoprazole  (PROTONIX ) 40 MG tablet Take 1 tablet (40 mg total) by mouth 2 (two) times daily.   rosuvastatin  (CRESTOR ) 5 MG tablet Take 1 tablet (5 mg total) by mouth daily.   traMADol  (ULTRAM ) 50 MG tablet Take 1 tablet (50 mg total) by mouth 3 (three) times daily as needed for moderate pain (pain score 4-6) or severe pain (pain score 7-10).   No facility-administered encounter medications on file as of 12/03/2024.    Surgical History: Past Surgical History:  Procedure Laterality Date   APPENDECTOMY  2009   INGUINAL HERNIA REPAIR Right 11/12/2018   Procedure: HERNIA REPAIR INGUINAL ADULT;  Surgeon: Desiderio Schanz, MD;  Location: [facility] ORS;  Service: General;  Laterality: Right;   KNEE ARTHROPLASTY Left 01/15/2023   Procedure: COMPUTER ASSISTED TOTAL KNEE ARTHROPLASTY;  Surgeon: Mardee Lynwood SQUIBB, MD;  Location: [facility] ORS;  Service: Orthopedics;  Laterality: Left;   TUMOR REMOVAL  2002   from right side of neck    Medical History: Past Medical History:  Diagnosis Date   Hyperlipidemia    Hypertension    Insomnia    Osteoarthritis     Family History: Family History  Problem Relation Age of Onset   Diabetes Mellitus II Mother    Hypertension Father     Social History   Socioeconomic History   Marital status: Widowed    Spouse name: Not on file   Number of children: 3   Years  of education: Not on file   Highest education level: Not on file  Occupational History   Not on file  Tobacco Use   Smoking status: Former    Current packs/day: 0.00    Types: Cigarettes    Quit date: 11/19/2011    Years since quitting: 13.0   Smokeless tobacco: Never  Vaping Use   Vaping status: Never Used  Substance and Sexual Activity   Alcohol use: No    Alcohol/week: 0.0 standard drinks of alcohol   Drug use: No    Comment: cbd gummies   Sexual activity: Not on file  Other Topics Concern   Not on file  Social History Narrative   Not on file   Social Drivers of Health    Tobacco Use: Medium Risk (12/03/2024)   Patient History    Smoking Tobacco Use: Former    Smokeless Tobacco Use: Never    Passive Exposure: Not on Actuary Strain: Low Risk  (10/27/2024)   Received from [facility]   Overall Financial Resource Strain (CARDIA)    Difficulty of Paying Living Expenses: Not very hard  Food Insecurity: No Food Insecurity (10/27/2024)   Received from [facility]   Epic    Within the past 12 months, you worried that your food would run out before you got the money to buy more.: Never true    Within the past 12 months, the food you bought just didn't last and you didn't have money to get more.: Never true  Transportation Needs: No Transportation Needs (10/27/2024)   Received [facility] Transportation    In the past 12 months, has lack of transportation kept you from medical appointments or from getting medications?: No    Lack of Transportation (Non-Medical): No  Physical Activity: Not on file  Stress: Not on file  Social Connections: Not on file  Intimate Partner Violence: Not At Risk (01/15/2023)   Humiliation, Afraid, Rape, and Kick questionnaire    Fear of Current or Ex-Partner: No    Emotionally Abused: No    Physically Abused: No    Sexually Abused: No  Depression (PHQ2-9): Low Risk (10/11/2024)   Depression (PHQ2-9)    PHQ-2 Score: 0  Alcohol Screen: Low Risk (12/28/2021)   Alcohol Screen    Last Alcohol Screening Score (AUDIT): 0  Housing: Low Risk  (10/27/2024)   Received from [facility]   Epic    In the last 12 months, was there a time when you were not able to pay the mortgage or rent on time?: No    In the past 12 months, how many times have you moved where you were living?: 0    At any time in the past 12 months, were you homeless or living in a shelter (including now)?: No  Utilities: Not At Risk (10/27/2024)   Received from [facility]   Epic    In the past 12 months has the electric, gas, oil, or water company threatened to shut off services in your home?: No  Health Literacy: Not on file      Review of Systems  Constitutional:  Positive for chills and fatigue. Negative for fever.  HENT:  Positive for congestion, postnasal drip, rhinorrhea and sore throat. Negative for sinus pressure and sinus pain.   Respiratory:  Positive for cough. Negative for chest tightness, shortness of breath and wheezing.   Cardiovascular:  Negative.  Negative for chest pain and palpitations.  Gastrointestinal:  Negative for constipation, diarrhea, nausea and vomiting.  Musculoskeletal:  Negative for myalgias.  Psychiatric/Behavioral:  Positive for sleep disturbance.     Vital Signs: Resp 16   Ht 5' 11 (1.803 m)   Wt 205 lb (93 kg)   BMI 28.59 kg/m    Observation/Objective: He is alert and oriented. No acute distress noted.     Assessment/Plan:   General Counseling: Terry Harvey verbalizes understanding of the findings of today's phone visit and agrees with plan of treatment. I have discussed any further diagnostic evaluation that may be needed or ordered today. We also reviewed his medications today. he has been encouraged to call the office with any questions or concerns that should arise related to todays visit.  Return if symptoms worsen or fail to improve.   No orders of the defined types were placed in this encounter.   Meds ordered this encounter  Medications   azithromycin  (ZITHROMAX ) 250 MG tablet    Sig: Take 2 tablets on day 1, then 1 tablet daily on days 2 through 5    Dispense:  6 tablet    Refill:  0    Fill new script today    Time spent:20  Minutes Time spent with patient included reviewing progress notes, labs, imaging studies, and discussing plan for follow up.  [place] Controlled Substance Database was reviewed by me for overdose risk score (ORS) if appropriate.  This patient was  seen by Mardy Maxin, FNP-C in collaboration with Dr. Sigrid Bathe as a part of collaborative care agreement.  Greysin Medlen R. Maxin, MSN, FNP-C Internal medicine

## 2025-01-06 ENCOUNTER — Ambulatory Visit: Admitting: Nurse Practitioner

## 2025-10-17 ENCOUNTER — Ambulatory Visit: Admitting: Nurse Practitioner
